# Patient Record
Sex: Female | Born: 1965 | Race: White | Hispanic: No | Marital: Married | Smoking: Former smoker
Health system: Southern US, Community
[De-identification: ages and names within clinical notes are randomized; demographics above are authoritative.]

## PROBLEM LIST (undated history)

## (undated) DIAGNOSIS — C569 Malignant neoplasm of unspecified ovary: Secondary | ICD-10-CM

## (undated) DIAGNOSIS — M810 Age-related osteoporosis without current pathological fracture: Secondary | ICD-10-CM

## (undated) DIAGNOSIS — T7840XA Allergy, unspecified, initial encounter: Secondary | ICD-10-CM

## (undated) DIAGNOSIS — K829 Disease of gallbladder, unspecified: Secondary | ICD-10-CM

## (undated) DIAGNOSIS — Z973 Presence of spectacles and contact lenses: Secondary | ICD-10-CM

## (undated) DIAGNOSIS — E669 Obesity, unspecified: Secondary | ICD-10-CM

## (undated) DIAGNOSIS — E7800 Pure hypercholesterolemia, unspecified: Secondary | ICD-10-CM

## (undated) HISTORY — PX: TONSILLECTOMY: SUR1361

## (undated) HISTORY — DX: Malignant neoplasm of unspecified ovary: C56.9

## (undated) HISTORY — DX: Obesity, unspecified: E66.9

## (undated) HISTORY — DX: Age-related osteoporosis without current pathological fracture: M81.0

## (undated) HISTORY — DX: Allergy, unspecified, initial encounter: T78.40XA

## (undated) HISTORY — DX: Disease of gallbladder, unspecified: K82.9

## (undated) HISTORY — DX: Pure hypercholesterolemia, unspecified: E78.00

## (undated) HISTORY — PX: KNEE RECONSTRUCTION: SHX5883

---

## 2005-02-28 ENCOUNTER — Emergency Department (HOSPITAL_COMMUNITY): Admission: EM | Admit: 2005-02-28 | Discharge: 2005-02-28 | Payer: Self-pay | Admitting: Family Medicine

## 2007-07-22 ENCOUNTER — Ambulatory Visit: Payer: Self-pay | Admitting: Internal Medicine

## 2007-07-22 DIAGNOSIS — N920 Excessive and frequent menstruation with regular cycle: Secondary | ICD-10-CM | POA: Insufficient documentation

## 2007-07-22 DIAGNOSIS — J309 Allergic rhinitis, unspecified: Secondary | ICD-10-CM | POA: Insufficient documentation

## 2007-07-28 LAB — CONVERTED CEMR LAB
ALT: 18 units/L (ref 0–35)
AST: 19 units/L (ref 0–37)
Albumin: 4.3 g/dL (ref 3.5–5.2)
BUN: 10 mg/dL (ref 6–23)
Basophils Relative: 0.3 % (ref 0.0–1.0)
CO2: 30 meq/L (ref 19–32)
Calcium: 9.9 mg/dL (ref 8.4–10.5)
Chloride: 106 meq/L (ref 96–112)
Creatinine, Ser: 0.5 mg/dL (ref 0.4–1.2)
Direct LDL: 143.6 mg/dL
Eosinophils Relative: 1.6 % (ref 0.0–5.0)
Glucose, Bld: 93 mg/dL (ref 70–99)
HDL: 47.8 mg/dL (ref >39.0)
Hemoglobin: 14.3 g/dL (ref 12.0–15.0)
Lymphocytes Relative: 23.8 % (ref 12.0–46.0)
Monocytes Relative: 10.6 % (ref 3.0–12.0)
Neutro Abs: 5.8 10*3/uL (ref 1.4–7.7)
Neutrophils Relative %: 63.7 % (ref 43.0–77.0)
RBC: 4.88 M/uL (ref 3.87–5.11)
Total Bilirubin: 0.6 mg/dL (ref 0.3–1.2)
Total Protein: 7.5 g/dL (ref 6.0–8.3)
VLDL: 17 mg/dL (ref 0–40)
WBC: 9 10*3/uL (ref 4.5–10.5)

## 2007-08-19 ENCOUNTER — Encounter: Payer: Self-pay | Admitting: Internal Medicine

## 2007-08-19 ENCOUNTER — Ambulatory Visit: Payer: Self-pay | Admitting: Internal Medicine

## 2007-08-19 ENCOUNTER — Other Ambulatory Visit: Admission: RE | Admit: 2007-08-19 | Discharge: 2007-08-19 | Payer: Self-pay | Admitting: Internal Medicine

## 2007-08-19 DIAGNOSIS — N852 Hypertrophy of uterus: Secondary | ICD-10-CM | POA: Insufficient documentation

## 2007-08-19 DIAGNOSIS — F17200 Nicotine dependence, unspecified, uncomplicated: Secondary | ICD-10-CM | POA: Insufficient documentation

## 2007-08-19 DIAGNOSIS — E785 Hyperlipidemia, unspecified: Secondary | ICD-10-CM

## 2007-08-19 DIAGNOSIS — J3489 Other specified disorders of nose and nasal sinuses: Secondary | ICD-10-CM | POA: Insufficient documentation

## 2007-08-19 DIAGNOSIS — E7849 Other hyperlipidemia: Secondary | ICD-10-CM | POA: Insufficient documentation

## 2007-09-03 ENCOUNTER — Encounter: Admission: RE | Admit: 2007-09-03 | Discharge: 2007-09-03 | Payer: Self-pay | Admitting: Internal Medicine

## 2007-09-11 ENCOUNTER — Encounter: Admission: RE | Admit: 2007-09-11 | Discharge: 2007-09-11 | Payer: Self-pay | Admitting: Internal Medicine

## 2007-09-24 ENCOUNTER — Encounter: Payer: Self-pay | Admitting: Internal Medicine

## 2008-06-08 HISTORY — PX: CHOLECYSTECTOMY: SHX55

## 2008-06-23 ENCOUNTER — Observation Stay (HOSPITAL_COMMUNITY): Admission: EM | Admit: 2008-06-23 | Discharge: 2008-06-25 | Payer: Self-pay | Admitting: Emergency Medicine

## 2008-06-24 ENCOUNTER — Encounter (INDEPENDENT_AMBULATORY_CARE_PROVIDER_SITE_OTHER): Payer: Self-pay | Admitting: Surgery

## 2009-05-05 ENCOUNTER — Encounter: Admission: RE | Admit: 2009-05-05 | Discharge: 2009-05-05 | Payer: Self-pay | Admitting: Obstetrics and Gynecology

## 2009-05-17 ENCOUNTER — Encounter: Admission: RE | Admit: 2009-05-17 | Discharge: 2009-05-17 | Payer: Self-pay | Admitting: Family Medicine

## 2009-05-25 ENCOUNTER — Encounter: Admission: RE | Admit: 2009-05-25 | Discharge: 2009-05-25 | Payer: Self-pay | Admitting: Family Medicine

## 2009-05-26 ENCOUNTER — Ambulatory Visit: Admission: RE | Admit: 2009-05-26 | Discharge: 2009-05-26 | Payer: Self-pay | Admitting: Gynecologic Oncology

## 2009-06-08 HISTORY — PX: ABDOMINAL HYSTERECTOMY: SHX81

## 2009-06-22 ENCOUNTER — Inpatient Hospital Stay (HOSPITAL_COMMUNITY): Admission: RE | Admit: 2009-06-22 | Discharge: 2009-06-25 | Payer: Self-pay | Admitting: Obstetrics & Gynecology

## 2009-06-22 ENCOUNTER — Encounter: Payer: Self-pay | Admitting: Obstetrics & Gynecology

## 2009-06-29 ENCOUNTER — Ambulatory Visit: Admission: RE | Admit: 2009-06-29 | Discharge: 2009-06-29 | Payer: Self-pay | Admitting: Gynecologic Oncology

## 2009-06-30 ENCOUNTER — Ambulatory Visit: Payer: Self-pay | Admitting: Oncology

## 2009-07-13 LAB — CBC WITH DIFFERENTIAL/PLATELET
Basophils Absolute: 0.1 10*3/uL (ref 0.0–0.1)
Eosinophils Absolute: 0.3 10*3/uL (ref 0.0–0.5)
HCT: 39.6 % (ref 34.8–46.6)
HGB: 13.5 g/dL (ref 11.6–15.9)
LYMPH%: 30 % (ref 14.0–49.7)
MCHC: 34.2 g/dL (ref 31.5–36.0)
MONO#: 0.5 10*3/uL (ref 0.1–0.9)
NEUT#: 3.4 10*3/uL (ref 1.5–6.5)
NEUT%: 55.1 % (ref 38.4–76.8)
Platelets: 374 10*3/uL (ref 145–400)
WBC: 6.1 10*3/uL (ref 3.9–10.3)

## 2009-07-13 LAB — COMPREHENSIVE METABOLIC PANEL
BUN: 10 mg/dL (ref 6–23)
CO2: 26 mEq/L (ref 19–32)
Calcium: 9.1 mg/dL (ref 8.4–10.5)
Creatinine, Ser: 0.47 mg/dL (ref 0.40–1.20)
Glucose, Bld: 83 mg/dL (ref 70–99)
Total Bilirubin: 0.3 mg/dL (ref 0.3–1.2)

## 2009-07-13 LAB — URINALYSIS, MICROSCOPIC - CHCC
Glucose: NEGATIVE g/dL
Ketones: NEGATIVE mg/dL
RBC count: NEGATIVE (ref 0–2)
Specific Gravity, Urine: 1.02 (ref 1.003–1.035)

## 2009-07-13 LAB — CA 125: CA 125: 168.3 U/mL — ABNORMAL HIGH (ref 0.0–30.2)

## 2009-07-14 LAB — URINE CULTURE

## 2009-07-30 ENCOUNTER — Ambulatory Visit: Payer: Self-pay | Admitting: Oncology

## 2009-08-02 LAB — CBC WITH DIFFERENTIAL/PLATELET
BASO%: 0.9 % (ref 0.0–2.0)
EOS%: 1.5 % (ref 0.0–7.0)
LYMPH%: 51.4 % — ABNORMAL HIGH (ref 14.0–49.7)
MCH: 28.7 pg (ref 25.1–34.0)
MCHC: 33.1 g/dL (ref 31.5–36.0)
MONO#: 0.4 10*3/uL (ref 0.1–0.9)
NEUT%: 34.6 % — ABNORMAL LOW (ref 38.4–76.8)
Platelets: 179 10*3/uL (ref 145–400)
RBC: 4.14 10*6/uL (ref 3.70–5.45)
WBC: 3.3 10*3/uL — ABNORMAL LOW (ref 3.9–10.3)
lymph#: 1.7 10*3/uL (ref 0.9–3.3)
nRBC: 0 % (ref 0–0)

## 2009-08-09 LAB — CBC WITH DIFFERENTIAL/PLATELET
Eosinophils Absolute: 0 10*3/uL (ref 0.0–0.5)
HCT: 39.8 % (ref 34.8–46.6)
LYMPH%: 47.2 % (ref 14.0–49.7)
MONO#: 0.7 10*3/uL (ref 0.1–0.9)
NEUT#: 1.5 10*3/uL (ref 1.5–6.5)
Platelets: 267 10*3/uL (ref 145–400)
RBC: 4.51 10*6/uL (ref 3.70–5.45)
WBC: 4.4 10*3/uL (ref 3.9–10.3)

## 2009-08-09 LAB — COMPREHENSIVE METABOLIC PANEL
Albumin: 4.6 g/dL (ref 3.5–5.2)
CO2: 28 mEq/L (ref 19–32)
Calcium: 10 mg/dL (ref 8.4–10.5)
Glucose, Bld: 84 mg/dL (ref 70–99)
Sodium: 141 mEq/L (ref 135–145)
Total Bilirubin: 0.3 mg/dL (ref 0.3–1.2)
Total Protein: 7.5 g/dL (ref 6.0–8.3)

## 2009-08-09 LAB — CA 125: CA 125: 29.1 U/mL (ref 0.0–30.2)

## 2009-08-13 LAB — CBC WITH DIFFERENTIAL/PLATELET
BASO%: 0.1 % (ref 0.0–2.0)
HCT: 41.7 % (ref 34.8–46.6)
MCHC: 33.8 g/dL (ref 31.5–36.0)
MONO#: 0.2 10*3/uL (ref 0.1–0.9)
RBC: 4.77 10*6/uL (ref 3.70–5.45)
RDW: 12.9 % (ref 11.2–14.5)
WBC: 15.7 10*3/uL — ABNORMAL HIGH (ref 3.9–10.3)
lymph#: 2.1 10*3/uL (ref 0.9–3.3)
nRBC: 0 % (ref 0–0)

## 2009-08-18 ENCOUNTER — Ambulatory Visit: Admission: RE | Admit: 2009-08-18 | Discharge: 2009-08-18 | Payer: Self-pay | Admitting: Gynecologic Oncology

## 2009-08-25 LAB — COMPREHENSIVE METABOLIC PANEL
Albumin: 4.3 g/dL (ref 3.5–5.2)
BUN: 13 mg/dL (ref 6–23)
Calcium: 9.5 mg/dL (ref 8.4–10.5)
Chloride: 104 mEq/L (ref 96–112)
Glucose, Bld: 93 mg/dL (ref 70–99)
Potassium: 4.4 mEq/L (ref 3.5–5.3)

## 2009-08-25 LAB — CBC WITH DIFFERENTIAL/PLATELET
Basophils Absolute: 0 10*3/uL (ref 0.0–0.1)
Eosinophils Absolute: 0 10*3/uL (ref 0.0–0.5)
HGB: 12.6 g/dL (ref 11.6–15.9)
NEUT#: 0.7 10*3/uL — ABNORMAL LOW (ref 1.5–6.5)
RBC: 4.27 10*6/uL (ref 3.70–5.45)
RDW: 12.8 % (ref 11.2–14.5)
WBC: 2.7 10*3/uL — ABNORMAL LOW (ref 3.9–10.3)
lymph#: 1.5 10*3/uL (ref 0.9–3.3)

## 2009-08-25 LAB — CA 125: CA 125: 15.9 U/mL (ref 0.0–30.2)

## 2009-08-27 LAB — CBC WITH DIFFERENTIAL/PLATELET
Eosinophils Absolute: 0.1 10*3/uL (ref 0.0–0.5)
MONO#: 1.8 10*3/uL — ABNORMAL HIGH (ref 0.1–0.9)
MONO%: 13.3 % (ref 0.0–14.0)
NEUT#: 8.3 10*3/uL — ABNORMAL HIGH (ref 1.5–6.5)
RBC: 4.35 10*6/uL (ref 3.70–5.45)
RDW: 13.3 % (ref 11.2–14.5)
WBC: 13.4 10*3/uL — ABNORMAL HIGH (ref 3.9–10.3)

## 2009-09-02 ENCOUNTER — Ambulatory Visit: Payer: Self-pay | Admitting: Oncology

## 2009-09-03 LAB — CBC WITH DIFFERENTIAL/PLATELET
Eosinophils Absolute: 0 10*3/uL (ref 0.0–0.5)
HCT: 40.2 % (ref 34.8–46.6)
LYMPH%: 14.9 % (ref 14.0–49.7)
MONO#: 0.1 10*3/uL (ref 0.1–0.9)
NEUT#: 9 10*3/uL — ABNORMAL HIGH (ref 1.5–6.5)
Platelets: 189 10*3/uL (ref 145–400)
RBC: 4.64 10*6/uL (ref 3.70–5.45)
WBC: 10.7 10*3/uL — ABNORMAL HIGH (ref 3.9–10.3)

## 2009-09-14 LAB — CBC WITH DIFFERENTIAL/PLATELET
Basophils Absolute: 0.2 10*3/uL — ABNORMAL HIGH (ref 0.0–0.1)
Eosinophils Absolute: 0 10*3/uL (ref 0.0–0.5)
HCT: 36.3 % (ref 34.8–46.6)
HGB: 12.5 g/dL (ref 11.6–15.9)
MCV: 89.2 fL (ref 79.5–101.0)
MONO%: 2.7 % (ref 0.0–14.0)
NEUT#: 21.7 10*3/uL — ABNORMAL HIGH (ref 1.5–6.5)
RDW: 14.3 % (ref 11.2–14.5)
lymph#: 3.5 10*3/uL — ABNORMAL HIGH (ref 0.9–3.3)

## 2009-09-14 LAB — COMPREHENSIVE METABOLIC PANEL
Albumin: 4.7 g/dL (ref 3.5–5.2)
BUN: 14 mg/dL (ref 6–23)
Calcium: 10 mg/dL (ref 8.4–10.5)
Chloride: 101 mEq/L (ref 96–112)
Glucose, Bld: 86 mg/dL (ref 70–99)
Potassium: 4.3 mEq/L (ref 3.5–5.3)

## 2009-09-24 LAB — CBC WITH DIFFERENTIAL/PLATELET
Eosinophils Absolute: 0 10*3/uL (ref 0.0–0.5)
MONO#: 0 10*3/uL — ABNORMAL LOW (ref 0.1–0.9)
MONO%: 0.3 % (ref 0.0–14.0)
NEUT#: 7.5 10*3/uL — ABNORMAL HIGH (ref 1.5–6.5)
RBC: 4.1 10*6/uL (ref 3.70–5.45)
RDW: 15.9 % — ABNORMAL HIGH (ref 11.2–14.5)
WBC: 8.2 10*3/uL (ref 3.9–10.3)

## 2009-10-06 ENCOUNTER — Ambulatory Visit: Payer: Self-pay | Admitting: Oncology

## 2009-10-08 LAB — CBC WITH DIFFERENTIAL/PLATELET
Basophils Absolute: 0 10*3/uL (ref 0.0–0.1)
EOS%: 0.5 % (ref 0.0–7.0)
Eosinophils Absolute: 0 10*3/uL (ref 0.0–0.5)
HCT: 33.5 % — ABNORMAL LOW (ref 34.8–46.6)
HGB: 11.8 g/dL (ref 11.6–15.9)
MCH: 32.2 pg (ref 25.1–34.0)
MONO#: 0.5 10*3/uL (ref 0.1–0.9)
NEUT#: 1.2 10*3/uL — ABNORMAL LOW (ref 1.5–6.5)
NEUT%: 36.1 % — ABNORMAL LOW (ref 38.4–76.8)
lymph#: 1.7 10*3/uL (ref 0.9–3.3)

## 2009-10-08 LAB — COMPREHENSIVE METABOLIC PANEL
Albumin: 4.6 g/dL (ref 3.5–5.2)
BUN: 10 mg/dL (ref 6–23)
CO2: 28 mEq/L (ref 19–32)
Calcium: 9.9 mg/dL (ref 8.4–10.5)
Chloride: 102 mEq/L (ref 96–112)
Creatinine, Ser: 0.46 mg/dL (ref 0.40–1.20)
Glucose, Bld: 100 mg/dL — ABNORMAL HIGH (ref 70–99)
Potassium: 3.9 mEq/L (ref 3.5–5.3)

## 2009-10-08 LAB — CA 125: CA 125: 13.9 U/mL (ref 0.0–30.2)

## 2009-10-15 LAB — CBC WITH DIFFERENTIAL/PLATELET
Basophils Absolute: 0 10*3/uL (ref 0.0–0.1)
Eosinophils Absolute: 0 10*3/uL (ref 0.0–0.5)
HGB: 12.7 g/dL (ref 11.6–15.9)
MONO%: 0.2 % (ref 0.0–14.0)
NEUT#: 8.1 10*3/uL — ABNORMAL HIGH (ref 1.5–6.5)
RBC: 4.09 10*6/uL (ref 3.70–5.45)
RDW: 18.9 % — ABNORMAL HIGH (ref 11.2–14.5)
WBC: 9 10*3/uL (ref 3.9–10.3)
lymph#: 0.9 10*3/uL (ref 0.9–3.3)

## 2009-10-25 LAB — CBC WITH DIFFERENTIAL/PLATELET
Basophils Absolute: 0 10*3/uL (ref 0.0–0.1)
Eosinophils Absolute: 0 10*3/uL (ref 0.0–0.5)
HGB: 11.3 g/dL — ABNORMAL LOW (ref 11.6–15.9)
LYMPH%: 61.1 % — ABNORMAL HIGH (ref 14.0–49.7)
MCV: 94.2 fL (ref 79.5–101.0)
MONO#: 0.3 10*3/uL (ref 0.1–0.9)
MONO%: 11.9 % (ref 0.0–14.0)
NEUT#: 0.6 10*3/uL — ABNORMAL LOW (ref 1.5–6.5)
Platelets: 265 10*3/uL (ref 145–400)
RBC: 3.44 10*6/uL — ABNORMAL LOW (ref 3.70–5.45)
RDW: 17.4 % — ABNORMAL HIGH (ref 11.2–14.5)
WBC: 2.5 10*3/uL — ABNORMAL LOW (ref 3.9–10.3)

## 2009-10-26 LAB — COMPREHENSIVE METABOLIC PANEL
Albumin: 4.6 g/dL (ref 3.5–5.2)
BUN: 16 mg/dL (ref 6–23)
CO2: 30 mEq/L (ref 19–32)
Calcium: 10 mg/dL (ref 8.4–10.5)
Glucose, Bld: 96 mg/dL (ref 70–99)
Potassium: 3.8 mEq/L (ref 3.5–5.3)
Sodium: 142 mEq/L (ref 135–145)
Total Protein: 7.3 g/dL (ref 6.0–8.3)

## 2009-10-26 LAB — CA 125: CA 125: 12 U/mL (ref 0.0–30.2)

## 2009-11-04 LAB — CBC WITH DIFFERENTIAL/PLATELET
BASO%: 0.8 % (ref 0.0–2.0)
Eosinophils Absolute: 0 10*3/uL (ref 0.0–0.5)
LYMPH%: 31.4 % (ref 14.0–49.7)
MCHC: 33.3 g/dL (ref 31.5–36.0)
MONO#: 0.7 10*3/uL (ref 0.1–0.9)
NEUT#: 5 10*3/uL (ref 1.5–6.5)
RBC: 3.67 10*6/uL — ABNORMAL LOW (ref 3.70–5.45)
RDW: 16.3 % — ABNORMAL HIGH (ref 11.2–14.5)
WBC: 8.5 10*3/uL (ref 3.9–10.3)
lymph#: 2.7 10*3/uL (ref 0.9–3.3)
nRBC: 0 % (ref 0–0)

## 2009-11-05 ENCOUNTER — Ambulatory Visit: Payer: Self-pay | Admitting: Oncology

## 2009-11-05 LAB — CBC WITH DIFFERENTIAL/PLATELET
Basophils Absolute: 0 10*3/uL (ref 0.0–0.1)
EOS%: 0 % (ref 0.0–7.0)
HGB: 12.6 g/dL (ref 11.6–15.9)
MCH: 32.7 pg (ref 25.1–34.0)
MONO#: 0 10*3/uL — ABNORMAL LOW (ref 0.1–0.9)
NEUT#: 10 10*3/uL — ABNORMAL HIGH (ref 1.5–6.5)
RDW: 17.2 % — ABNORMAL HIGH (ref 11.2–14.5)
WBC: 11.1 10*3/uL — ABNORMAL HIGH (ref 3.9–10.3)
lymph#: 1.1 10*3/uL (ref 0.9–3.3)

## 2009-11-16 LAB — CBC WITH DIFFERENTIAL/PLATELET
BASO%: 0.7 % (ref 0.0–2.0)
Basophils Absolute: 0 10*3/uL (ref 0.0–0.1)
MCH: 34 pg (ref 25.1–34.0)
MCV: 97.9 fL (ref 79.5–101.0)
MONO#: 0.6 10*3/uL (ref 0.1–0.9)
NEUT#: 0.7 10*3/uL — ABNORMAL LOW (ref 1.5–6.5)
NEUT%: 21.6 % — ABNORMAL LOW (ref 38.4–76.8)
Platelets: 285 10*3/uL (ref 145–400)
RBC: 3.31 10*6/uL — ABNORMAL LOW (ref 3.70–5.45)
lymph#: 1.8 10*3/uL (ref 0.9–3.3)

## 2009-12-16 ENCOUNTER — Ambulatory Visit (HOSPITAL_COMMUNITY): Admission: RE | Admit: 2009-12-16 | Discharge: 2009-12-16 | Payer: Self-pay | Admitting: Oncology

## 2009-12-17 ENCOUNTER — Ambulatory Visit: Payer: Self-pay | Admitting: Oncology

## 2009-12-22 ENCOUNTER — Ambulatory Visit: Admission: RE | Admit: 2009-12-22 | Discharge: 2009-12-22 | Payer: Self-pay | Admitting: Gynecologic Oncology

## 2010-03-21 ENCOUNTER — Ambulatory Visit: Payer: Self-pay | Admitting: Oncology

## 2010-03-23 LAB — CBC WITH DIFFERENTIAL/PLATELET
BASO%: 0.5 % (ref 0.0–2.0)
Basophils Absolute: 0 10*3/uL (ref 0.0–0.1)
EOS%: 1.4 % (ref 0.0–7.0)
Eosinophils Absolute: 0.1 10*3/uL (ref 0.0–0.5)
HGB: 13 g/dL (ref 11.6–15.9)
LYMPH%: 39.2 % (ref 14.0–49.7)
MCH: 31.4 pg (ref 25.1–34.0)
MCHC: 34.3 g/dL (ref 31.5–36.0)
MCV: 91.4 fL (ref 79.5–101.0)
MONO%: 10.4 % (ref 0.0–14.0)
NEUT#: 2.7 10*3/uL (ref 1.5–6.5)
NEUT%: 48.5 % (ref 38.4–76.8)
Platelets: 227 10*3/uL (ref 145–400)
RBC: 4.15 10*6/uL (ref 3.70–5.45)
RDW: 12.4 % (ref 11.2–14.5)
lymph#: 2.2 10*3/uL (ref 0.9–3.3)

## 2010-03-23 LAB — COMPREHENSIVE METABOLIC PANEL
ALT: 18 U/L (ref 0–35)
Albumin: 4.4 g/dL (ref 3.5–5.2)
Alkaline Phosphatase: 68 U/L (ref 39–117)
Glucose, Bld: 72 mg/dL (ref 70–99)
Potassium: 4 mEq/L (ref 3.5–5.3)
Sodium: 143 mEq/L (ref 135–145)
Total Protein: 7.2 g/dL (ref 6.0–8.3)

## 2010-05-01 ENCOUNTER — Encounter: Payer: Self-pay | Admitting: Obstetrics and Gynecology

## 2010-05-01 ENCOUNTER — Encounter: Payer: Self-pay | Admitting: Internal Medicine

## 2010-05-02 ENCOUNTER — Encounter: Payer: Self-pay | Admitting: Internal Medicine

## 2010-05-05 ENCOUNTER — Other Ambulatory Visit: Payer: Self-pay | Admitting: Oncology

## 2010-05-05 DIAGNOSIS — Z1231 Encounter for screening mammogram for malignant neoplasm of breast: Secondary | ICD-10-CM

## 2010-05-05 DIAGNOSIS — Z1239 Encounter for other screening for malignant neoplasm of breast: Secondary | ICD-10-CM

## 2010-05-19 ENCOUNTER — Ambulatory Visit
Admission: RE | Admit: 2010-05-19 | Discharge: 2010-05-19 | Disposition: A | Discharge status: Home / Self Care | Payer: BC Managed Care – PPO | Source: Ambulatory Visit | Attending: Oncology | Admitting: Oncology

## 2010-05-19 DIAGNOSIS — Z1231 Encounter for screening mammogram for malignant neoplasm of breast: Secondary | ICD-10-CM

## 2010-06-09 ENCOUNTER — Encounter (HOSPITAL_BASED_OUTPATIENT_CLINIC_OR_DEPARTMENT_OTHER): Payer: BC Managed Care – PPO | Admitting: Oncology

## 2010-06-09 ENCOUNTER — Other Ambulatory Visit: Payer: Self-pay | Admitting: Oncology

## 2010-06-09 DIAGNOSIS — I808 Phlebitis and thrombophlebitis of other sites: Secondary | ICD-10-CM

## 2010-06-09 LAB — CA 125: CA 125: 10.2 U/mL (ref 0.0–30.2)

## 2010-06-15 ENCOUNTER — Ambulatory Visit: Payer: BC Managed Care – PPO | Attending: Gynecologic Oncology | Admitting: Gynecologic Oncology

## 2010-06-15 DIAGNOSIS — C569 Malignant neoplasm of unspecified ovary: Secondary | ICD-10-CM | POA: Insufficient documentation

## 2010-06-15 DIAGNOSIS — Z9079 Acquired absence of other genital organ(s): Secondary | ICD-10-CM | POA: Insufficient documentation

## 2010-06-15 DIAGNOSIS — Z90710 Acquired absence of both cervix and uterus: Secondary | ICD-10-CM | POA: Insufficient documentation

## 2010-07-01 NOTE — Consult Note (Signed)
NAMEVANNAH, NADAL NO.:  [id number]  MEDICAL RECORD NO.:  [id number]           PATIENT TYPE:  LOCATION:                                 FACILITY:  PHYSICIAN:  Rosamund Nyland A. Duard Brady, MD    DATE OF BIRTH:  [birth year]  DATE OF CONSULTATION:  06/15/2010 DATE OF DISCHARGE:                                CONSULTATION   Ms. Blecha is a very pleasant 45 year old, as of yesterday, who in February 2011 was diagnosed bilateral complex adnexal masses and an elevated CA-125 of 145.  She underwent exploratory laparotomy, TAH-BSO, and appropriate staging in June 22, 2009.  Findings included a 13-cm ovarian mass.  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  She was treated with 6 cycles of paclitaxel and carboplatin with her last cycle being November 05, 2009.  Post-treatment CT was negative.  I last saw her September 14, at which time her exam was negative.  She was seen by Dr. Darrold Span on December 11, similarly with a negative exam.  Her CA-125 have completely normalized, the one from March 1, was 10 and as stated above, her preoperative CA-125 was elevated to 145.  She comes in today for followup.  She is overall doing very well and denies any significant complaints.  She states she is trying to wean herself off her Effexor.  She is currently on 37.5 mg daily, but each time she tries to stop, the day she does not take it, she really feels very low energy.  She did have some rib discomfort on the right side and when her husband would hug her, it would hurt but that pain is subsequently resolved.  She denies any change in bowel or bladder habits, any nausea, vomiting, fevers, chills, headaches, visual changes, unintentional weight loss or weight gain, or early satiety. 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  PHYSICAL EXAMINATION:  VITAL SIGNS:  Weight 185 pounds,  height 5 feet 2 inches, for a BMI of 34, blood pressure 122/70, pulse 68, respirations 16, and temperature 98. GENERAL:  A well-nourished, well-developed female, in no acute distress. NECK:  Supple.  There is no lymphadenopathy, no thyromegaly. LUNGS:  Clear to auscultation bilaterally. CARDIOVASCULAR:  Regular rate and rhythm. ABDOMEN:  Shows well-healed vertical midline incision.  There is no evidence of any incisional hernias.  Abdomen is soft, nontender, and nondistended.  There are no palpable masses or hepatosplenomegaly. Groins are negative for adenopathy. EXTREMITIES:  No edema. PELVIC:  External genitalia are within normal limits.  Bimanual examination reveals no masses or nodularity.  Rectal confirms.  ASSESSMENT:  A 45-year with stage IC ovarian carcinoma who clinically has no evidence of recurrent disease.  She is just about 1 year from the time of her diagnosis.  Her CA-125 is normal.  PLAN: 1. She will follow up with me in 6 months and return to see Dr.     Darrold Span in 3 months. 2. She is up-to-date on her mammogram.     Javarie Crisp A. Duard Brady, MD  PAG/MEDQ  D:  06/15/2010  T:  06/15/2010  Job:  [id number]  cc:   Lennis P. Darrold Span, M.D. Fax: [phone]  Melida Quitter, M.D. Fax: [phone]  Telford Nab, R.N. [address] [place], [place] [address]  Electronically Signed by Cleda Mccreedy MD on 06/16/2010 12:08:04 PM

## 2010-07-04 LAB — CBC
HCT: 38.2 % (ref 36.0–46.0)
HCT: 39.5 % (ref 36.0–46.0)
HCT: 46.4 % — ABNORMAL HIGH (ref 36.0–46.0)
Hemoglobin: 12.3 g/dL (ref 12.0–15.0)
Hemoglobin: 13.2 g/dL (ref 12.0–15.0)
Hemoglobin: 14.9 g/dL (ref 12.0–15.0)
MCHC: 33.4 g/dL (ref 30.0–36.0)
MCV: 90.4 fL (ref 78.0–100.0)
MCV: 91.1 fL (ref 78.0–100.0)
RBC: 4.17 MIL/uL (ref 3.87–5.11)
RBC: 4.36 MIL/uL (ref 3.87–5.11)
RBC: 5.09 MIL/uL (ref 3.87–5.11)
RDW: 13 % (ref 11.5–15.5)
RDW: 13.2 % (ref 11.5–15.5)
WBC: 10.7 10*3/uL — ABNORMAL HIGH (ref 4.0–10.5)
WBC: 23.9 10*3/uL — ABNORMAL HIGH (ref 4.0–10.5)

## 2010-07-04 LAB — DIFFERENTIAL
Basophils Absolute: 0 10*3/uL (ref 0.0–0.1)
Basophils Absolute: 0.1 10*3/uL (ref 0.0–0.1)
Basophils Relative: 0 % (ref 0–1)
Basophils Relative: 1 % (ref 0–1)
Eosinophils Relative: 0 % (ref 0–5)
Eosinophils Relative: 1 % (ref 0–5)
Lymphocytes Relative: 19 % (ref 12–46)
Monocytes Absolute: 1.8 10*3/uL — ABNORMAL HIGH (ref 0.1–1.0)
Monocytes Relative: 13 % — ABNORMAL HIGH (ref 3–12)
Neutro Abs: 7.7 10*3/uL (ref 1.7–7.7)

## 2010-07-04 LAB — URINALYSIS, ROUTINE W REFLEX MICROSCOPIC
Bilirubin Urine: NEGATIVE
Hgb urine dipstick: NEGATIVE
Ketones, ur: NEGATIVE mg/dL
Nitrite: NEGATIVE
Specific Gravity, Urine: 1.015 (ref 1.005–1.030)
Urobilinogen, UA: 0.2 mg/dL (ref 0.0–1.0)

## 2010-07-04 LAB — COMPREHENSIVE METABOLIC PANEL
AST: 20 U/L (ref 0–37)
Albumin: 4.6 g/dL (ref 3.5–5.2)
Chloride: 102 mEq/L (ref 96–112)
Creatinine, Ser: 0.51 mg/dL (ref 0.4–1.2)
GFR calc Af Amer: >60 mL/min (ref >60)
Potassium: 4.2 mEq/L (ref 3.5–5.1)
Total Bilirubin: 0.6 mg/dL (ref 0.3–1.2)
Total Protein: 8.2 g/dL (ref 6.0–8.3)

## 2010-07-04 LAB — URINE CULTURE: Colony Count: >=100000

## 2010-07-04 LAB — BASIC METABOLIC PANEL
Calcium: 8.3 mg/dL — ABNORMAL LOW (ref 8.4–10.5)
GFR calc Af Amer: >60 mL/min (ref >60)
GFR calc non Af Amer: >60 mL/min (ref >60)
Glucose, Bld: 128 mg/dL — ABNORMAL HIGH (ref 70–99)
Potassium: 4.4 mEq/L (ref 3.5–5.1)
Sodium: 139 mEq/L (ref 135–145)

## 2010-07-04 LAB — URINE MICROSCOPIC-ADD ON

## 2010-07-04 LAB — TYPE AND SCREEN

## 2010-07-21 LAB — CBC
HCT: 41.5 % (ref 36.0–46.0)
Hemoglobin: 14.2 g/dL (ref 12.0–15.0)
MCHC: 34.3 g/dL (ref 30.0–36.0)
Platelets: 308 10*3/uL (ref 150–400)
RDW: 12.9 % (ref 11.5–15.5)

## 2010-07-21 LAB — URINALYSIS, ROUTINE W REFLEX MICROSCOPIC
Glucose, UA: NEGATIVE mg/dL
Hgb urine dipstick: NEGATIVE
Ketones, ur: NEGATIVE mg/dL
Protein, ur: NEGATIVE mg/dL
Urobilinogen, UA: 0.2 mg/dL (ref 0.0–1.0)

## 2010-07-21 LAB — COMPREHENSIVE METABOLIC PANEL
Albumin: 4.2 g/dL (ref 3.5–5.2)
BUN: 10 mg/dL (ref 6–23)
Calcium: 9.7 mg/dL (ref 8.4–10.5)
Creatinine, Ser: 0.46 mg/dL (ref 0.4–1.2)
Glucose, Bld: 109 mg/dL — ABNORMAL HIGH (ref 70–99)
Total Protein: 7.3 g/dL (ref 6.0–8.3)

## 2010-07-21 LAB — DIFFERENTIAL
Basophils Relative: 0 % (ref 0–1)
Lymphocytes Relative: 13 % (ref 12–46)
Lymphs Abs: 1.7 10*3/uL (ref 0.7–4.0)
Monocytes Relative: 7 % (ref 3–12)
Neutro Abs: 10 10*3/uL — ABNORMAL HIGH (ref 1.7–7.7)
Neutrophils Relative %: 80 % — ABNORMAL HIGH (ref 43–77)

## 2010-08-23 NOTE — H&P (Signed)
Carmen Garza, Carmen Garza NO.:  [id number]   MEDICAL RECORD NO.:  [id number]          PATIENT TYPE:  INP   LOCATION:  5504                         FACILITY:  [facility]   PHYSICIAN:  Adolph Pollack, M.D.DATE OF BIRTH:  [birth year]   DATE OF ADMISSION:  06/22/2008  DATE OF DISCHARGE:                              HISTORY & PHYSICAL   CHIEF COMPLAINT:  Severe right upper quadrant pain.   HISTORY:  This is a 45 year old female who had the onset of right upper  quadrant pain about 3 hours after eating an Egg McMuffin.  She said it  started out as an ache, then progressively increased till it was quite  severe and led her to go to Urgent Care, and she was then referred to  [facility] Emergency Department.  She had an ultrasound at [facility]  Emergency Department demonstrating cholelithiasis as well as gallbladder  wall thickening, was concerned with acute cholecystitis.  She has had no  fever but has had associated nausea.   PAST MEDICAL HISTORY:  Chronic sinus disease.   PREVIOUS OPERATIONS:  Tonsillectomy.   ALLERGIES:  None.   MEDICATIONS:  Claritin and multivitamin.   SOCIAL HISTORY:  She smokes about a pack, she is positive for tobacco  use.  No alcohol use.  She is working at a desk job.   FAMILY HISTORY:  Mother has hypertension and colon cancer.  Father died  from lung cancer.   REVIEW OF SYSTEMS:  CARDIOVASCULAR:  She denies heart disease or  hypertension.  PULMONARY:  She denies asthma, pneumonia, or  tuberculosis.  GI:  She denies any peptic ulcer disease, hepatitis, or  jaundice.  GU:  No kidney stones.  ENDOCRINE:  No diabetes or  hypercholesterolemia.  NEUROLOGIC:  No strokes or seizures.  HEMATOLOGIC:  No bleeding disorders or blood transfusions.   PHYSICAL EXAMINATION:  GENERAL:  An overweight female, appears somewhat  uncomfortable.  VITAL SIGNS:  Temperature is 98 degrees, blood pressure is 112/71, pulse  74, respiratory rate 20, and O2  saturations 97% on room air.  EYES:  Extraocular motions intact.  No icterus.  NECK:  Supple without masses.  RESPIRATORY:  Breath sounds equal and clear.  Respirations unlabored.  CARDIOVASCULAR:  Regular rate and regular rhythm.  I hear no murmur.  ABDOMEN:  Soft with right upper quadrant tenderness and guarding and a  positive Murphy sign.  No palpable organomegaly.  No palpable  gallbladder or mass.  EXTREMITIES:  No edema and good muscle tone.  SKIN:  No jaundice.   LABORATORY DATA:  Liver function tests and lipase within normal limits.  White blood cell count is 12,600.   Ultrasound as per HPI.   IMPRESSION:  Evolving acute cholecystitis with cholelithiasis.   PLAN:  Admit and start IV antibiotics.  Hydrate.  Cholecystectomy this  admission.  I have discussed the procedure and risks of laparoscopic or  possible open cholecystectomy.  The risks include but are not limited to  bleeding, infection, wound healing problems, anesthesia, bile leak,  injury to common bile duct/liver/intestine, and postcholecystectomy  diarrhea.  I also talked about light activity for 2 weeks following the  surgery.  She seems to understand this and agrees with the plan.      Adolph Pollack, M.D.  Electronically Signed     TJR/MEDQ  D:  06/23/2008  T:  06/23/2008  Job:  [id number]   cc:   Carmen Mends. Fabian Sharp, MD

## 2010-08-23 NOTE — H&P (Signed)
NAMECAERA, ENWRIGHT NO.:  [id number]   MEDICAL RECORD NO.:  [id number]          PATIENT TYPE:  INP   LOCATION:  5504                         FACILITY:  [facility]   PHYSICIAN:  Adolph Pollack, M.D.DATE OF BIRTH:  [birth year]   DATE OF ADMISSION:  06/22/2008  DATE OF DISCHARGE:                              HISTORY & PHYSICAL   CHIEF COMPLAINT:  Severe right upper quadrant pain.   HISTORY:  This is a 45 year old female who had the onset of right upper  quadrant pain about 3 hours after eating an Egg McMuffin.  She said it  started out as an ache then progressively increased though it was quite  severe and let her to go to Urgent Care and she was then referred to  [facility] Emergency Department.  She had an ultrasound at [facility]  Emergency Department demonstrating cholelithiasis as well as gallbladder  wall thickening, was concerned with acute cholecystitis.  She has had no  fever, but has had associated nausea.   PAST MEDICAL HISTORY:  Chronic sinus disease.   PREVIOUS OPERATIONS:  Tonsillectomy.   ALLERGIES:  None.   MEDICATIONS:  Claritin and multivitamin.   SOCIAL HISTORY:  She smokes about a pack.  She is positive for tobacco  use.  No alcohol use.  She is working at a desk job.   FAMILY HISTORY:  Mother has hypertension and colon cancer.  Father died  from lung cancer.   REVIEW OF SYSTEMS:  CARDIOVASCULAR:  She denies heart disease or  hypertension.  PULMONARY:  She denies asthma, pneumonia, or  tuberculosis.  GI:  She denies any peptic ulcer disease, hepatitis, or  jaundice.  GU:  No kidney stones.  ENDOCRINE:  No diabetes or  hypercholesterolemia.  NEUROLOGIC:  No strokes or seizures.  HEMATOLOGIC:  No bleeding disorders or blood transfusions.   PHYSICAL EXAMINATION:  GENERAL:  An overweight female, appears somewhat  uncomfortable.  VITAL SIGNS:  Temperature is 98 degrees, blood pressure is 112/71, pulse  74, respiratory rate 20, and O2  saturation is 97% on room air.  EYES:  Extraocular motions intact.  No icterus.  NECK:  Supple without masses.  RESPIRATORY:  Breath sounds equal and clear.  Respirations unlabored.  CARDIOVASCULAR:  Regular rate and regular rhythm.  I heard no murmur.  ABDOMEN:  Soft with right upper quadrant tenderness and guarding and a  positive Murphy sign.  No palpable organomegaly.  No palpable  gallbladder or mass.  EXTREMITIES:  No edema and good muscle tone.  SKIN:  No jaundice.   LABORATORY DATA:  Liver function tests; lipase within normal limits.  White blood cell count is 12,600.   Ultrasound as per HPI.   IMPRESSION:  Evolving acute cholecystitis with cholelithiasis.   PLAN:  Admit and start IV antibiotics.  Hydrate.  Cholecystectomy this  admission.  I have discussed the procedure risks, laparoscopic, or  possible open cholecystectomy.  The risks include, but not limited to  bleeding, infection, wound healing problems, anesthesia, bile leak,  injury to common bile duct/liver/intestine, and postcholecystectomy  diarrhea.  We also  talked about light activity for 2 weeks following the  surgery.  She seems to understand this and agrees with the plan.      Adolph Pollack, M.D.  Electronically Signed     TJR/MEDQ  D:  06/23/2008  T:  06/23/2008  Job:  [id number]   cc:   Neta Mends. Fabian Sharp, MD

## 2010-08-23 NOTE — Op Note (Signed)
NAMEJOZEE, HAMMER NO.:  [id number]   MEDICAL RECORD NO.:  [id number]          PATIENT TYPE:  INP   LOCATION:  5504                         FACILITY:  [facility]   PHYSICIAN:  Currie Paris, M.D.DATE OF BIRTH:  Jun 05, 1965   DATE OF PROCEDURE:  06/24/2008  DATE OF DISCHARGE:                               OPERATIVE REPORT   PREOPERATIVE DIAGNOSIS:  Early acute calculus cholecystitis.   POSTOPERATIVE DIAGNOSIS:  Early acute calculus cholecystitis.   OPERATION:  Laparoscopic cholecystectomy with operative cholangiogram.   SURGEON:  Currie Paris, MD   ASSISTANT:  Kelle Darting. Rennis Harding, RN   SURGEON:  Anselm Pancoast. Zachery Dakins, MD   ANESTHESIA:  General.   CLINICAL HISTORY:  This is a 45 year old lady who has typical biliary  symptoms and developed with acute cholecystitis.  After discussion with  the patient, she was admitted to the hospital and scheduled for  laparoscopic cholecystectomy.   DESCRIPTION OF PROCEDURE:  The patient was seen in the holding area and  she had no further questions.  We confirmed cholecystectomy as the  planned procedure.   The patient was taken to the operating room and after satisfactory  general endotracheal anesthesia had been obtained, the abdomen was  prepped and draped.  The time-out was done.   I used 0.25% plain Marcaine for each incision.  The umbilical incision  was made, the fascia opened and the peritoneal cavity entered under  direct vision.  Hasson was introduced and the abdomen insufflated to 15.   The patient was placed in reverse Trendelenburg and tilted to the left.  A 10/11 trocar was placed in the epigastrium using two 5 mm laterally.   Gallbladder was identified and retracted over the liver.  It was  edematous consistent with an early acute cholecystitis.   I opened the peritoneum on either side of the triangle of Calot and I  was able to isolate the cystic duct and cystic artery, had a long  segment of  each identified with nice window behind them.   I put a single clip on the cystic artery and one on the cystic duct at  the junction with the gallbladder.  A Cook catheter was placed  percutaneously and placed into the cystic duct and an operative  cholangiogram was done.  This catheter was only able to get part way  into the duct because of valve, so there was a little extravasation, but  the common duct looked normal with no filling defects.   The cystic duct catheter was removed and 3 clips placed on the stay side  of the cystic duct.  That was divided and 2 additional clips placed in  the cystic artery, which was then divided leaving 2 clips on the stay  side.   The gallbladder was removed from below to above with coagulation current  of the cautery and placed in the bag.  I then irrigated and made sure  everything was dry.  I checked the clips on the duct and they looked  intact as well as the artery.   The gallbladder was brought  out the umbilical site.  The abdomen was  reinsufflated.  A final irrigation check was made and then the lateral  ports were removed under direct vision.  The umbilical site was closed  with a pursestring.  The abdomen was deflated through the epigastric  site.  Skin was closed with 4-0 Monocryl, subcuticular, and Dermabond.   The patient tolerated the procedure well and there were no operative  complications.  All counts were correct.      Currie Paris, M.D.  Electronically Signed     CJS/MEDQ  D:  06/24/2008  T:  06/24/2008  Job:  [id number]

## 2010-08-23 NOTE — Discharge Summary (Signed)
NAMEHAROLDINE, Carmen Garza NO.:  [id number]   MEDICAL RECORD NO.:  [id number]          PATIENT TYPE:  INP   LOCATION:  5504                         FACILITY:  [facility]   PHYSICIAN:  Currie Paris, M.D.DATE OF BIRTH:  18-Jul-1965   DATE OF ADMISSION:  06/22/2008  DATE OF DISCHARGE:  06/25/2008                               DISCHARGE SUMMARY   ADMITTING PHYSICIAN:  Adolph Pollack, MD   DISCHARGE PHYSICIAN:  Currie Paris, MD   PROCEDURES:  Laparoscopic cholecystectomy with intraoperative  cholangiogram by Dr. Jamey Ripa on June 24, 2008.  There were no  consultants.   REASON FOR ADMISSION:  [name] is a 45 year old female who presented  to the emergency department with a complaint of an acute onset of right  upper quadrant abdominal pain 3 hours after eating breakfast.  Her pain  progressively worsened and was then associated with nausea.  At that  time, she presented to the emergency department where an ultrasound was  obtained which showed cholelithiasis with small gallbladder wall  thickening and therefore was admitted with acute cholecystitis.  Please  see admitting history and physical for further details.   ADMITTING DIAGNOSES:  1. Acute cholecystitis.  2. Cholelithiasis.   HOSPITAL COURSE:  At this time, the patient was admitted and allowed to  have clear liquids.  She was started on Unasyn prophylactically.  On  hospital day 2, the patient was taken to the operating room where a  laparoscopic cholecystectomy with intraoperative cholangiogram was  completed.  The patient tolerated this procedure well.  By postoperative  day 1, the patient was doing much better.  She was having some soreness  from surgery, but otherwise tolerating a regular diet without any nausea  or vomiting.  On exam, her abdomen was soft, tender with active bowel  sounds.  Her incisions were all dry and covered.  At this time because  she was tolerating a regular diet, she  was felt stable for discharge  home.   DISCHARGE DIAGNOSES:  1. Acute cholecystitis.  2. Cholelithiasis.  3. Status post laparoscopic cholecystectomy with intraoperative      cholangiogram.   DISCHARGE MEDICATIONS:  The patient is informed that she may resume all  home medications including:  1. Claritin 10 mg daily.  2. MVI 1 tablet daily.  She is also given a prescription for Tylox 1      p.o. q.4 h. p.r.n. pain.   DISCHARGE INSTRUCTIONS:  The patient is informed that she may return to  work in 1-2 weeks.  She is not to do any heavy lifting for 2 weeks  greater than 10 or 15 pounds.  She may shower, however, she is not to  bathe for the next 2 weeks.  She may increase her activity slowly and  may walk up steps.  She has no dietary restrictions, but is informed she  may follow a low-fat diet if higher fat, higher greasy foods tend to  give her loose bowel movements.  She is also informed that she may  remove her gauze and tape tomorrow and she  may shower at that time.  Otherwise, she is to call our office for fever greater than 101.5 or  worsening abdominal pain.  Otherwise, she is scheduled to follow up in  the [facility] on July 07, 2008, at 3 o'clock p.m.  She is otherwise to  call if she cannot make that appointment.      Letha Cape, PA      Currie Paris, M.D.  Electronically Signed    KEO/MEDQ  D:  06/25/2008  T:  06/25/2008  Job:  [id number]

## 2010-10-05 ENCOUNTER — Other Ambulatory Visit: Payer: Self-pay | Admitting: Oncology

## 2010-10-05 ENCOUNTER — Encounter (HOSPITAL_BASED_OUTPATIENT_CLINIC_OR_DEPARTMENT_OTHER): Payer: BC Managed Care – PPO | Admitting: Oncology

## 2010-10-05 DIAGNOSIS — I808 Phlebitis and thrombophlebitis of other sites: Secondary | ICD-10-CM

## 2010-10-05 DIAGNOSIS — C569 Malignant neoplasm of unspecified ovary: Secondary | ICD-10-CM

## 2010-10-05 LAB — COMPREHENSIVE METABOLIC PANEL
Albumin: 3.8 g/dL (ref 3.5–5.2)
BUN: 13 mg/dL (ref 6–23)
CO2: 29 mEq/L (ref 19–32)
Glucose, Bld: 82 mg/dL (ref 70–99)
Potassium: 3.9 mEq/L (ref 3.5–5.3)
Sodium: 136 mEq/L (ref 135–145)
Total Bilirubin: 0.2 mg/dL — ABNORMAL LOW (ref 0.3–1.2)
Total Protein: 6.8 g/dL (ref 6.0–8.3)

## 2010-10-05 LAB — CBC WITH DIFFERENTIAL/PLATELET
Basophils Absolute: 0 10*3/uL (ref 0.0–0.1)
Eosinophils Absolute: 0.1 10*3/uL (ref 0.0–0.5)
HGB: 12.9 g/dL (ref 11.6–15.9)
LYMPH%: 40.3 % (ref 14.0–49.7)
MONO#: 0.6 10*3/uL (ref 0.1–0.9)
NEUT#: 3 10*3/uL (ref 1.5–6.5)
Platelets: 189 10*3/uL (ref 145–400)
RBC: 4.25 10*6/uL (ref 3.70–5.45)
WBC: 6.2 10*3/uL (ref 3.9–10.3)

## 2010-10-05 LAB — CA 125: CA 125: 11.9 U/mL (ref 0.0–30.2)

## 2010-12-14 ENCOUNTER — Encounter (HOSPITAL_BASED_OUTPATIENT_CLINIC_OR_DEPARTMENT_OTHER): Payer: BC Managed Care – PPO | Admitting: Oncology

## 2010-12-14 ENCOUNTER — Other Ambulatory Visit: Payer: Self-pay | Admitting: Oncology

## 2010-12-14 DIAGNOSIS — C569 Malignant neoplasm of unspecified ovary: Secondary | ICD-10-CM

## 2010-12-15 LAB — CA 125: CA 125: 9.9 U/mL (ref 0.0–30.2)

## 2010-12-28 ENCOUNTER — Ambulatory Visit: Payer: BC Managed Care – PPO | Attending: Gynecologic Oncology | Admitting: Gynecologic Oncology

## 2010-12-28 DIAGNOSIS — R51 Headache: Secondary | ICD-10-CM | POA: Insufficient documentation

## 2010-12-28 DIAGNOSIS — Z9221 Personal history of antineoplastic chemotherapy: Secondary | ICD-10-CM | POA: Insufficient documentation

## 2010-12-28 DIAGNOSIS — Z9079 Acquired absence of other genital organ(s): Secondary | ICD-10-CM | POA: Insufficient documentation

## 2010-12-28 DIAGNOSIS — C569 Malignant neoplasm of unspecified ovary: Secondary | ICD-10-CM | POA: Insufficient documentation

## 2010-12-28 DIAGNOSIS — Z90710 Acquired absence of both cervix and uterus: Secondary | ICD-10-CM | POA: Insufficient documentation

## 2010-12-30 NOTE — Consult Note (Signed)
NAMELAVELLE, BERLAND NO.:  [id number]  MEDICAL RECORD NO.:  [id number]  LOCATION:  GYN                          FACILITY:  [facility]  PHYSICIAN:  Mutasim Tuckey A. Duard Brady, MD    DATE OF BIRTH:  1965-05-18  DATE OF CONSULTATION:  12/28/2010 DATE OF DISCHARGE:                                CONSULTATION   Carmen Garza is a very pleasant 45 year old who in February 2011 was diagnosed with bilateral complex adnexal masses and elevated CA-125 of 145.  She underwent exploratory laparotomy, TAHBSO, and appropriate staging in March 2011.  Operative findings included a 13-cm ovarian mass.  Pathology was consistent with a grade 1 endometrioid adenocarcinoma arising within an atypical proliferation of an endometrioid tumor and endometriosis, final stage I C.  She completed treatment with six cycles of paclitaxel and carboplatin with her last cycle of chemotherapy in May-July 2011.  Posttreatment CT scan was negative.  Her CA-125 is completely normalized.  I last saw her in March 2012, at which time, she is doing quite well.  She continued feeling well and wanted to wean herself off her Effexor.  Unfortunately, over time as she has done that, she has noticed that she is having increasing headaches which are new for her.  She is also having some anger issues with her husband and related to work.  She does feel depressed and this has really been going on for a few months, and in hindsight, when we talked about it, she does state that there is an association between when she really started coming off her Effexor when these symptoms arose.  Her 10-point review of systems is otherwise negative with the exception of some increasing hot flashes.  She denies any chest pain, shortness of breath, nausea, vomiting, fevers, chills, abdominal pain, change in bowel or bladder habits.  Headaches are there.  She does not have one today.  There is no neurologic symptoms associated with that. She had a  CA-125 done on December 14, 2010, that was normal at 10.  PHYSICAL EXAMINATION:  VITAL SIGNS:  Weight 187 pounds, blood pressure 102/70, respirations 16, and temperature 97.9. GENERAL:  Well-nourished and well-developed female, in no acute distress. NECK:  Supple.  There is no lymphadenopathy, no thyromegaly. LUNGS:  Clear to auscultation bilaterally. CARDIOVASCULAR:  Regular rate and rhythm. ABDOMEN:  Shows well-healed vertical midline incision.  Abdomen is soft, nontender, and nondistended.  There are no palpable masses or hepatosplenomegaly.  There is no incisional hernia.  Groins are negative for adenopathy. EXTREMITIES:  There is no edema. PELVIC:  External genitalia is within normal limits.  Bimanual examination reveals no masses or nodularity.  Rectal confirms.  ASSESSMENT:  A 46-year with stage I C grade I ovarian carcinoma, endometrioid type diagnosed in March 2011.  She completed her last cycle of paclitaxel and carboplatin in July 2011, had a negative post- treatment CT scan.  She clinically has no evidence of recurrent disease with a negative exam and negative cancer antigen-125.  PLAN:  She will follow up with Dr. Darrold Span in 3 months for repeat evaluation of CA-125.  She is concerned about her medical bills and I  discussed with  her that we most likely do not need to give full labs  at each of her visits, but she can discuss this with Dr. Darrold Span.   She should return to see [name] in 6 months. I prescribed Effexor 37.5.  She will call us in 2 weeks and let us know how she is feeling with increased dose.     Tomma Ehinger A. Duard Brady, MD     PAG/MEDQ  D:  12/28/2010  T:  12/28/2010  Job:  [id number]  cc:   Telford Nab, R.N. [address] [place], [place] [address]  Reece Packer, M.D. Fax: [phone]  Melida Quitter, M.D. Fax: [phone]  Electronically Signed by Cleda Mccreedy MD on 12/30/2010 07:44:16 AM

## 2011-03-29 ENCOUNTER — Other Ambulatory Visit: Payer: Self-pay | Admitting: Oncology

## 2011-03-29 ENCOUNTER — Encounter: Payer: Self-pay | Admitting: Physician Assistant

## 2011-03-29 ENCOUNTER — Other Ambulatory Visit (HOSPITAL_BASED_OUTPATIENT_CLINIC_OR_DEPARTMENT_OTHER): Payer: BC Managed Care – PPO | Admitting: Lab

## 2011-03-29 ENCOUNTER — Ambulatory Visit (HOSPITAL_BASED_OUTPATIENT_CLINIC_OR_DEPARTMENT_OTHER): Payer: BC Managed Care – PPO | Admitting: Physician Assistant

## 2011-03-29 ENCOUNTER — Telehealth: Payer: Self-pay | Admitting: Oncology

## 2011-03-29 VITALS — BP 155/75 | HR 85 | Temp 97.1°F | Ht 62.5 in | Wt 192.3 lb

## 2011-03-29 DIAGNOSIS — C569 Malignant neoplasm of unspecified ovary: Secondary | ICD-10-CM

## 2011-03-29 DIAGNOSIS — Z8371 Family history of colonic polyps: Secondary | ICD-10-CM

## 2011-03-29 DIAGNOSIS — J302 Other seasonal allergic rhinitis: Secondary | ICD-10-CM

## 2011-03-29 HISTORY — DX: Malignant neoplasm of unspecified ovary: C56.9

## 2011-03-29 LAB — COMPREHENSIVE METABOLIC PANEL
ALT: 28 U/L (ref 0–35)
Albumin: 4.3 g/dL (ref 3.5–5.2)
CO2: 28 mEq/L (ref 19–32)
Chloride: 104 mEq/L (ref 96–112)
Glucose, Bld: 100 mg/dL — ABNORMAL HIGH (ref 70–99)
Potassium: 4.4 mEq/L (ref 3.5–5.3)
Sodium: 142 mEq/L (ref 135–145)
Total Protein: 7 g/dL (ref 6.0–8.3)

## 2011-03-29 LAB — CBC WITH DIFFERENTIAL/PLATELET
BASO%: 0.5 % (ref 0.0–2.0)
EOS%: 1.3 % (ref 0.0–7.0)
MCH: 30.4 pg (ref 25.1–34.0)
MCHC: 34.5 g/dL (ref 31.5–36.0)
RDW: 12.6 % (ref 11.2–14.5)
lymph#: 2.2 10*3/uL (ref 0.9–3.3)

## 2011-03-29 LAB — CA 125: CA 125: 11.8 U/mL (ref 0.0–30.2)

## 2011-03-29 NOTE — Telephone Encounter (Signed)
Pt will be seen by Dr Duard Brady in March 2013 per Dagmar Hait, pt aware of appt.

## 2011-03-30 NOTE — Progress Notes (Signed)
No images are attached to the encounter. No scans are attached to the encounter. No scans are attached to the encounter. 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  CURRENT THERAPY: Observation  INTERVAL HISTORY: ALAYNE ESTRELLA 45 y.o. female returns for a scheduled regular 6 month office visit for followup of her history 1C ovarian carcinoma. The patient voices no specific complaints. She does state that she is doing much better on the extended release Effexor as prescribed by Dr. Duard Brady. She reports occasional hot flashes. She has not inject had a colonoscopy. She plans to followup with her primary care physician Dr. Tiburcio Pea and have him refer her to an [facility] gastroenterologist for this procedure. She has gained weight but plans to modify her diet, cutting out junk food, and begin exercising 4 days a week right after Christmas.  MEDICAL HISTORY: Past Medical History  Diagnosis Date  . Ovarian cancer 03/29/2011    ALLERGIES:   has no known allergies.  MEDICATIONS:  Current Outpatient Prescriptions  Medication Sig Dispense Refill  . loratadine (CLARITIN) 10 MG tablet Take 10 mg by mouth daily.        . pseudoephedrine (SUDAFED) 30 MG tablet Take 30 mg by mouth every 4 (four) hours as needed.        . venlafaxine (EFFEXOR) 37.5 MG tablet Take 37.5 mg by mouth 2 (two) times daily.          SURGICAL HISTORY: History reviewed. No pertinent past surgical history.  REVIEW OF SYSTEMS:  A comprehensive review of systems was negative except for: Occasional hot flashes.   PHYSICAL EXAMINATION: General appearance: alert, cooperative, appears stated age and no distress Head: Normocephalic,  without obvious abnormality, atraumatic Neck: no adenopathy, no carotid bruit, no JVD, supple, symmetrical, trachea midline and thyroid not enlarged, symmetric, no tenderness/mass/nodules Lymph nodes: Cervical, supraclavicular, and axillary nodes normal. Resp: clear to auscultation bilaterally Back: symmetric, no curvature. ROM normal. No CVA tenderness. Cardio: regular rate and rhythm, S1, S2 normal, no murmur, click, rub or gallop GI: abnormal findings:  obese and Nontender no hepatosplenomegaly. Extremities: extremities normal, atraumatic, no cyanosis or edema  ECOG PERFORMANCE STATUS: 0 - Asymptomatic  Blood pressure 155/75, pulse 85, temperature 97.1 F (36.2 C), temperature source Oral, height 5' 2.5" (1.588 m), weight 192 lb 4.8 oz (87.227 kg).  LABORATORY DATA: Lab Results  Component Value Date   WBC 6.3 03/29/2011   HGB 13.7 03/29/2011   HCT 39.8 03/29/2011   MCV 88.0 03/29/2011   PLT 206 03/29/2011      Chemistry      Component Value Date/Time   NA 142 03/29/2011 0851   NA 142 03/29/2011 0851   K 4.4 03/29/2011 0851   K 4.4 03/29/2011 0851   CL 104 03/29/2011 0851   CL 104 03/29/2011 0851   CO2 28 03/29/2011 0851   CO2 28 03/29/2011 0851   BUN 12 03/29/2011 0851   BUN 12 03/29/2011 0851   CREATININE 0.48* 03/29/2011 0851   CREATININE 0.48* 03/29/2011 0851      Component Value Date/Time   CALCIUM 9.6 03/29/2011 0851   CALCIUM 9.6 03/29/2011 0851   ALKPHOS 76 03/29/2011 0851   ALKPHOS 76 03/29/2011 0851   AST 24  03/29/2011 0851   AST 24 03/29/2011 0851   ALT 28 03/29/2011 0851   ALT 28 03/29/2011 0851   BILITOT 0.5 03/29/2011 0851   BILITOT 0.5 03/29/2011 0851       RADIOGRAPHIC STUDIES:  No results found.   ASSESSMENT/PLAN: This is a very pleasant 45 year old white female with a history of 1C ovarian carcinoma treated surgically a diagnosis in February 2011 followed by 6 cycles of adjuvant chemotherapy with carboplatin and Taxol from July 2001. No  known recurrence. She is been seen by Dr. Duard Brady in September and her CA -125 at that point was 9.9 down from 11.9 in June of 2012. CA 125 from today's visit is 11.8. Patient was discussed with Dr. Darrold Span. The patient is encouraged to keep her appointment with Dr. Tiburcio Pea, her primary care physician, and proceed with a colonoscopy as has been recommended given her family history of colon cancer. Of note her intermittent diarrhea has resolved. She'll follow up with Dr. Darrold Span in approximately 3 months with repeat CBC differential C met and CA 125. She was encouraged in her efforts towards weight loss.     Conni Slipper, PA-C     All questions were answered. The patient knows to call the clinic with any problems, questions or concerns. We can certainly see the patient much sooner if necessary.

## 2011-04-02 ENCOUNTER — Encounter (HOSPITAL_COMMUNITY): Payer: Self-pay

## 2011-04-02 ENCOUNTER — Emergency Department (INDEPENDENT_AMBULATORY_CARE_PROVIDER_SITE_OTHER)
Admission: EM | Admit: 2011-04-02 | Discharge: 2011-04-02 | Disposition: A | Discharge status: Home / Self Care | Payer: BC Managed Care – PPO | Source: Home / Self Care | Attending: Family Medicine | Admitting: Family Medicine

## 2011-04-02 DIAGNOSIS — J329 Chronic sinusitis, unspecified: Secondary | ICD-10-CM

## 2011-04-02 MED ORDER — AMOXICILLIN-POT CLAVULANATE 875-125 MG PO TABS: 1.0000 | ORAL_TABLET | Freq: Two times a day (BID) | ORAL | Status: AC

## 2011-04-02 NOTE — ED Provider Notes (Signed)
History     CSN: [id number]  Arrival date & time 04/02/11  4098   First MD Initiated Contact with Patient 04/02/11 [id number]      Chief Complaint  Patient presents with  . Facial Pain    (Consider location/radiation/quality/duration/timing/severity/associated sxs/prior treatment) HPI Comments: Carmen Garza presents for evaluation of sinus pressure, pain behind her LEFT eye, headache. She reports hx of seasonal allergies, for which she takes Claritin. She reports that she was supposed to take nasal steroids as well but has difficulty tolerating them. She reports a recurrent hx of sinus infections. She denies tobacco.   Patient is a 45 y.o. female presenting with sinusitis. The history is provided by the patient.  Sinusitis  This is a new problem. The current episode started yesterday. The problem has not changed since onset.There has been no fever. The pain is mild. The pain has been constant since onset. Associated symptoms include congestion and sinus pressure. Pertinent negatives include no chills, no ear pain, no sore throat, no cough and no shortness of breath. She has tried nothing for the symptoms.    Past Medical History  Diagnosis Date  . Ovarian cancer 03/29/2011    History reviewed. No pertinent past surgical history.  History reviewed. No pertinent family history.  History  Substance Use Topics  . Smoking status: Former Smoker -- 0.5 packs/day for 12 years  . Smokeless tobacco: Not on file  . Alcohol Use: Yes    OB History    Grav Para Term Preterm Abortions TAB SAB Ect Mult Living                  Review of Systems  Constitutional: Negative for fever and chills.  HENT: Positive for congestion, facial swelling and sinus pressure. Negative for ear pain, sore throat and trouble swallowing.   Eyes: Negative.   Respiratory: Negative for cough, shortness of breath and wheezing.   Cardiovascular: Negative.   Gastrointestinal: Negative.   Genitourinary: Negative.     Musculoskeletal: Negative.   Skin: Negative.     Allergies  Review of patient's allergies indicates no known allergies.  Home Medications   Current Outpatient Rx  Name Route Sig Dispense Refill  . AMOXICILLIN-POT CLAVULANATE 875-125 MG PO TABS Oral Take 1 tablet by mouth 2 (two) times daily. 20 tablet 0  . LORATADINE 10 MG PO TABS Oral Take 10 mg by mouth daily.      Marland Kitchen PSEUDOEPHEDRINE HCL 30 MG PO TABS Oral Take 30 mg by mouth every 4 (four) hours as needed.      . VENLAFAXINE HCL 37.5 MG PO TABS Oral Take 37.5 mg by mouth 2 (two) times daily.        BP 142/89  Pulse 89  Temp(Src) 98.3 F (36.8 C) (Oral)  Resp 14  SpO2 100%  Physical Exam  Nursing note and vitals reviewed. Constitutional: She is oriented to person, place, and time. She appears well-developed and well-nourished.  HENT:  Head: Normocephalic and atraumatic.  Right Ear: Tympanic membrane is retracted.  Left Ear: Tympanic membrane is retracted.  Mouth/Throat: Uvula is midline, oropharynx is clear and moist and mucous membranes are normal.  Eyes: EOM are normal.  Neck: Normal range of motion.  Pulmonary/Chest: Effort normal and breath sounds normal. She has no wheezes. She has no rhonchi.  Musculoskeletal: Normal range of motion.  Neurological: She is alert and oriented to person, place, and time.  Skin: Skin is warm and dry.  Psychiatric: Her behavior is normal.  ED Course  Procedures (including critical care time)  Labs Reviewed - No data to display No results found.   1. Sinusitis       MDM  rx for Augmentin given        Richardo Priest, MD 04/02/11 1025

## 2011-04-02 NOTE — ED Notes (Signed)
Pt has sinus pressure and swelling of face on lt side since yesterday

## 2011-06-20 ENCOUNTER — Other Ambulatory Visit: Payer: Self-pay | Admitting: Family Medicine

## 2011-06-27 ENCOUNTER — Ambulatory Visit: Payer: BC Managed Care – PPO | Admitting: Oncology

## 2011-07-05 ENCOUNTER — Ambulatory Visit (HOSPITAL_BASED_OUTPATIENT_CLINIC_OR_DEPARTMENT_OTHER): Payer: BC Managed Care – PPO | Admitting: Oncology

## 2011-07-05 ENCOUNTER — Telehealth: Payer: Self-pay | Admitting: Oncology

## 2011-07-05 ENCOUNTER — Encounter: Payer: Self-pay | Admitting: Oncology

## 2011-07-05 ENCOUNTER — Other Ambulatory Visit (HOSPITAL_BASED_OUTPATIENT_CLINIC_OR_DEPARTMENT_OTHER): Payer: BC Managed Care – PPO | Admitting: Lab

## 2011-07-05 VITALS — BP 129/85 | HR 84 | Temp 97.3°F | Ht 62.5 in | Wt 191.1 lb

## 2011-07-05 DIAGNOSIS — E669 Obesity, unspecified: Secondary | ICD-10-CM

## 2011-07-05 DIAGNOSIS — Z1231 Encounter for screening mammogram for malignant neoplasm of breast: Secondary | ICD-10-CM

## 2011-07-05 DIAGNOSIS — C569 Malignant neoplasm of unspecified ovary: Secondary | ICD-10-CM

## 2011-07-05 DIAGNOSIS — Z85038 Personal history of other malignant neoplasm of large intestine: Secondary | ICD-10-CM

## 2011-07-05 DIAGNOSIS — H6690 Otitis media, unspecified, unspecified ear: Secondary | ICD-10-CM

## 2011-07-05 LAB — CBC WITH DIFFERENTIAL/PLATELET
Eosinophils Absolute: 0.1 10*3/uL (ref 0.0–0.5)
HCT: 40.7 % (ref 34.8–46.6)
LYMPH%: 39.7 % (ref 14.0–49.7)
MCHC: 33.8 g/dL (ref 31.5–36.0)
MCV: 88.4 fL (ref 79.5–101.0)
MONO#: 0.7 10*3/uL (ref 0.1–0.9)
MONO%: 9.6 % (ref 0.0–14.0)
NEUT#: 3.4 10*3/uL (ref 1.5–6.5)
NEUT%: 49.3 % (ref 38.4–76.8)
Platelets: 238 10*3/uL (ref 145–400)
RBC: 4.61 10*6/uL (ref 3.70–5.45)
WBC: 6.8 10*3/uL (ref 3.9–10.3)

## 2011-07-05 LAB — COMPREHENSIVE METABOLIC PANEL
ALT: 19 U/L (ref 0–35)
Alkaline Phosphatase: 83 U/L (ref 39–117)
Creatinine, Ser: 0.48 mg/dL — ABNORMAL LOW (ref 0.50–1.10)
Sodium: 139 mEq/L (ref 135–145)
Total Bilirubin: 0.3 mg/dL (ref 0.3–1.2)
Total Protein: 7.3 g/dL (ref 6.0–8.3)

## 2011-07-05 LAB — CA 125: CA 125: 11.5 U/mL (ref 0.0–30.2)

## 2011-07-05 NOTE — Progress Notes (Signed)
OFFICE PROGRESS NOTE Date of Visit 07-05-2011 Physicians: Holley Bouche, P.Gehrig  INTERVAL HISTORY:  Patient is seen, alone for visit, in follow up of history of IC ovarian carcinoma. She is also scheduled to Dr.Gehrig 07-27-11, which may be a bit sooner than she needs with appointment here today. 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 Surgery was TAH/BSO, bilateral pelvic and periaortic lymphadenectomy and omentectomy by Dr.Gehrig 06-22-2009, followed by 6 cycles of taxol/carboplatin thru July 2011. Last scans were CT AP 12/2009. She saw Dr.Gehrig last in Sept 2012. Ca 125 preop was 145, in Dec 2012  11.8 and in Sept 2012  9.9.   Patient is due mammograms which we will set up, last at [facility] 05-20-2010. She had had some soreness lateral left breast which resolved with decrease in caffeine.  She has not yet been seen by GI, tho I had tried to make that referral when I saw her last, particularly as patient's mother had colon cancer at age 17. She is still reluctant to go to GI and does not want me to make referral now, but tells me she expects her primary Dr.Harris to refer her to [facility] when she sees him for physical exam on April 8. Note present NCCN recommendation for patients with first degree relative with colorectal cancer at age 49-60 is for colonoscopy beginning age 62 and every 5 years.   Patient tells me that she had been doing very well other than environmental allergies and sinusitis ~ a month ago. She has some post nasal drainage and some left ear discomfort for past couple of days, despite sudafed. She has no fever and no lower respiratory symptoms.  She tried coming off Effexor before Dr.Gehrig's last visit and found that hot flashes were not as severe but she felt very irritable. She is much less  irritable on the Effexor, but feels "too unemotional" and wonders if dose of the XR can be decreased:  See medications below.  Review of Systems otherwise: gradual, progressive weight gain, not really addressing with diet/exercise which we have discussed. No breast symptoms now. No pain except left ear. Bowels moving normally, no noted bleeding, no bladder symptoms, no LE swelling, no skin problems, no HA or other neurologic symptoms. Remainder of 10 point ROS negative.  Objective:  Vital signs in last 24 hours:  BP 129/85  Pulse 84  Temp(Src) 97.3 F (36.3 C) (Oral)  Ht 5' 2.5" (1.588 m)  Wt 191 lb 1.6 oz (86.682 kg)  BMI 34.40 kg/m2 Weight is up 8 lbs from June 2012. Alert, very pleasant as always, easily mobile, looks comfortable.  HEENT:mucous membranes moist, pharynx normal without lesions. PERRL. Left TM dull with some erythema central and superiorly, right TM with good light reflex and no erythema. LymphaticsCervical, supraclavicular, and axillary nodes normal.No inguinal adenopathy. Resp: clear to auscultation bilaterally and normal percussion bilaterally Cardio: regular rate and rhythm GI: soft, non-tender; bowel sounds normal; no masses,  no organomegaly palpable, obese. Surgical incision well-healed. Extremities: extremities normal, atraumatic, no cyanosis or edema Neuro:CN, motor, sensory nonfocal Breasts bilaterally without dominant masses, skin or nipple changes, axillae benign. Lab Results:  [facility] 07/05/11 1034  WBC 6.8  HGB 13.8  HCT 40.7  PLT 238  ANC 3.4  BMET  Basename 07/05/11 1034  NA 139  K 3.9  CL 104  CO2 30  GLUCOSE 97  BUN 12  CREATININE 0.48*  CALCIUM 9.7  Remainder of full CMET normal CA 125 resulted after visit  11.5 Studies/Results: Mammograms ordered now Medications: I have reviewed the patient's current medications. She is on lowest dose of Effexor XR available.  I have written amoxicillin 250mg  tid x 7 days for the  otitis. Assessment/Plan:  1. IC ovarian carcinoma, history and treatment as above, clinically doing well. I will let gyn oncology know of today's visit. I will see her back probably in 6 months after their next visit, or sooner if needed. 2.left otitis media: amoxicillin as above 3. History of colon cancer in mother at age 27: screening colonoscopy appropriate per NCCN guidelines. 4.obesity: she is not eligible for GOG-0225 as < stage II and too far out from treatment, however I have discussed this diet/physical activity trial with her in general. The exercise part of this trial is attempting to increase steps/day by 4000 above baseline. 5.due mammograms, set up now. 6.financial concerns related to medical care   Reece Packer, MD   07/05/2011, 9:33 PM

## 2011-07-05 NOTE — Telephone Encounter (Signed)
Gv pt appt for dec2013.  pt stated that she will schedule mammogram appt. called kim wih Dr. Duard Brady and she wil call pt with june2013 appt

## 2011-07-05 NOTE — Patient Instructions (Signed)
I have left message for Harriett Sine in gyn onc to see if Dr.Gehrig's apt can be moved. Nancy's # is [phone]

## 2011-07-06 ENCOUNTER — Other Ambulatory Visit: Payer: Self-pay

## 2011-07-06 DIAGNOSIS — H6690 Otitis media, unspecified, unspecified ear: Secondary | ICD-10-CM

## 2011-07-06 MED ORDER — AMOXICILLIN 250 MG PO CAPS: 250.0000 mg | ORAL_CAPSULE | Freq: Three times a day (TID) | ORAL | Status: AC

## 2011-07-24 ENCOUNTER — Ambulatory Visit
Admission: RE | Admit: 2011-07-24 | Discharge: 2011-07-24 | Disposition: A | Discharge status: Home / Self Care | Payer: BC Managed Care – PPO | Source: Ambulatory Visit | Attending: Oncology | Admitting: Oncology

## 2011-07-24 ENCOUNTER — Other Ambulatory Visit: Payer: Self-pay | Admitting: Oncology

## 2011-07-24 ENCOUNTER — Ambulatory Visit: Payer: BC Managed Care – PPO

## 2011-07-24 DIAGNOSIS — Z1231 Encounter for screening mammogram for malignant neoplasm of breast: Secondary | ICD-10-CM

## 2011-07-27 ENCOUNTER — Ambulatory Visit: Payer: BC Managed Care – PPO | Admitting: Gynecologic Oncology

## 2011-09-27 ENCOUNTER — Encounter: Payer: Self-pay | Admitting: Gynecologic Oncology

## 2011-09-28 ENCOUNTER — Ambulatory Visit: Payer: BC Managed Care – PPO | Admitting: Lab

## 2011-09-28 ENCOUNTER — Ambulatory Visit: Payer: BC Managed Care – PPO | Attending: Gynecologic Oncology | Admitting: Gynecologic Oncology

## 2011-09-28 ENCOUNTER — Other Ambulatory Visit: Payer: BC Managed Care – PPO | Admitting: Lab

## 2011-09-28 ENCOUNTER — Ambulatory Visit: Payer: BC Managed Care – PPO

## 2011-09-28 ENCOUNTER — Encounter: Payer: Self-pay | Admitting: Gynecologic Oncology

## 2011-09-28 VITALS — BP 108/68 | HR 80 | Temp 97.8°F | Resp 16 | Ht 62.0 in | Wt 173.0 lb

## 2011-09-28 DIAGNOSIS — Z833 Family history of diabetes mellitus: Secondary | ICD-10-CM | POA: Insufficient documentation

## 2011-09-28 DIAGNOSIS — Z8543 Personal history of malignant neoplasm of ovary: Secondary | ICD-10-CM | POA: Insufficient documentation

## 2011-09-28 DIAGNOSIS — Z9221 Personal history of antineoplastic chemotherapy: Secondary | ICD-10-CM | POA: Insufficient documentation

## 2011-09-28 DIAGNOSIS — Z800 Family history of malignant neoplasm of digestive organs: Secondary | ICD-10-CM | POA: Insufficient documentation

## 2011-09-28 DIAGNOSIS — Z90710 Acquired absence of both cervix and uterus: Secondary | ICD-10-CM | POA: Insufficient documentation

## 2011-09-28 DIAGNOSIS — E669 Obesity, unspecified: Secondary | ICD-10-CM | POA: Insufficient documentation

## 2011-09-28 DIAGNOSIS — Z8249 Family history of ischemic heart disease and other diseases of the circulatory system: Secondary | ICD-10-CM | POA: Insufficient documentation

## 2011-09-28 DIAGNOSIS — Z09 Encounter for follow-up examination after completed treatment for conditions other than malignant neoplasm: Secondary | ICD-10-CM | POA: Insufficient documentation

## 2011-09-28 DIAGNOSIS — Z87891 Personal history of nicotine dependence: Secondary | ICD-10-CM | POA: Insufficient documentation

## 2011-09-28 DIAGNOSIS — Z801 Family history of malignant neoplasm of trachea, bronchus and lung: Secondary | ICD-10-CM | POA: Insufficient documentation

## 2011-09-28 DIAGNOSIS — C569 Malignant neoplasm of unspecified ovary: Secondary | ICD-10-CM

## 2011-09-28 LAB — CA 125: CA 125: 11.9 U/mL (ref 0.0–30.2)

## 2011-09-28 NOTE — Patient Instructions (Signed)
Followup with Dr. Darrold Span in 6 months in with our service in 12 months.

## 2011-09-28 NOTE — Progress Notes (Signed)
Consult Note: Gyn-Onc  Carmen Garza 46 y.o. female  CC:  Chief Complaint  Patient presents with  . Ovarian Cancer    Follow up    HPI: Carmen Garza is a very pleasant 46 year old who in February 2011 was diagnosed with bilateral complex adnexal masses and elevated CA-125 of 145. She underwent exploratory laparotomy, TAHBSO, and appropriate  staging in March 2011. Operative findings included a 13-cm ovarian mass. Pathology was consistent with a grade 1 endometrioid adenocarcinoma arising within an atypical proliferation of an endometrioid tumor and endometriosis, final stage I C. She completed treatment with six cycles of paclitaxel and carboplatin with her last  cycle of chemotherapy in May-July 2011. Posttreatment CT scan was negative. Her CA-125 is completely normalized. I last saw her in September 2012, at which time, she was doing quite well. She continued feeling  well and wanted to wean herself off her Effexor. Unfortunately, over time as she has done that, she had noticed that she was having increasing headaches which were new for her.    Interval History:  She is overall feeling better than when I last saw her in September. Recently she has had a bit of increase with her hot flashes but attributes that to increased stress which should be getting better soon. Her headaches have diminished significantly. She has lost 14 pounds since we last saw her. She is taking phentermine. She's not exercising very much and most of the weight loss secondary to dietary  modifications. She had a mammogram in April and has a one year followup.  Review of Systems Her 10-point review of systems is otherwise negative with the exception of some increasing hot flashes. She denies any chest pain, shortness of breath, nausea, vomiting, fevers, chills, abdominal pain, change in bowel or bladder habits.  She does not have one today. There is no neurologic symptoms associated with that. She had a CA-125 done on  December 14, 2010, that was normal at 9.9.  She had a CA-125 in 3/13 that was 11.5.   Current Meds:  Outpatient Encounter Prescriptions as of 09/28/2011  Medication Sig Dispense Refill  . loratadine (CLARITIN) 10 MG tablet Take 10 mg by mouth daily.        . phentermine 37.5 MG capsule Take 37.5 mg by mouth every morning.      . pseudoephedrine (SUDAFED) 30 MG tablet Take 30 mg by mouth every 4 (four) hours as needed.        . venlafaxine (EFFEXOR-XR) 37.5 MG 24 hr capsule Take 37.5 mg by mouth daily.        Allergy: No Known Allergies  Social Hx:   History   Social History  . Marital Status: Married    Spouse Name: N/A    Number of Children: N/A  . Years of Education: N/A   Occupational History  . Not on file.   Social History Main Topics  . Smoking status: Former Smoker -- 0.5 packs/day for 12 years  . Smokeless tobacco: Not on file  . Alcohol Use: Yes  . Drug Use: No  . Sexually Active: Not on file   Other Topics Concern  . Not on file   Social History Narrative  . No narrative on file    Past Surgical Hx:  Past Surgical History  Procedure Date  . Abdominal hysterectomy 06/2009    Exp lap, TAH/BSO, staging  . Tonsillectomy age 27  . Cholecystectomy 06/2008    Lap    Past Medical Hx:  Past Medical History  Diagnosis Date  . Obesity   . Elevated cholesterol     Borderline  . Ovarian cancer 03/29/2011    ovarian carcinoma IC    Family Hx:  Family History  Problem Relation Age of Onset  . Colon cancer Mother   . Lung cancer Father   . Hypertension Sister   . Hypertension Brother   . Diabetes Maternal Grandmother     Vitals:  Blood pressure 108/68, pulse 80, temperature 97.8 F (36.6 C), temperature source Oral, resp. rate 16, height 5\' 2"  (1.575 m), weight 173 lb (78.472 kg).  Physical Exam: GENERAL: Well-nourished and well-developed female, in no acute distress.   NECK: Supple. There is no lymphadenopathy, no thyromegaly.   LUNGS: Clear to  auscultation bilaterally.   CARDIOVASCULAR: Regular rate and rhythm.    ABDOMEN: Shows well-healed vertical midline incision. Abdomen is soft, nontender, and nondistended. There are no palpable masses or hepatosplenomegaly. There is no incisional hernia. Groins are negative for adenopathy.   EXTREMITIES: There is no edema .  PELVIC: External genitalia is within normal limits. Bimanual examination reveals no masses or nodularity. Rectal confirms.  ASSESSMENT: A 46-year with stage I C grade I ovarian carcinoma, endometrioid type diagnosed in March 2011. She completed her last cycle of paclitaxel and carboplatin in July 2011, had a negative post- treatment CT scan. She clinically has no evidence of recurrent disease with a negative exam and negative cancer antigen-125.   PLAN: She will follow up with Dr. Darrold Span in 6 months for repeat evaluation of CA-125.  She should return to see [name] in 12 months.       Ayad Nieman A., MD 09/28/2011, 9:07 AM

## 2011-09-29 ENCOUNTER — Telehealth: Payer: Self-pay | Admitting: Gynecologic Oncology

## 2011-09-29 NOTE — Telephone Encounter (Signed)
Pt informed of CA 125 results: 11.9.  No questions or concerns voiced.

## 2011-12-04 ENCOUNTER — Other Ambulatory Visit: Payer: Self-pay | Admitting: Gastroenterology

## 2012-03-25 ENCOUNTER — Telehealth: Payer: Self-pay | Admitting: Oncology

## 2012-03-25 ENCOUNTER — Other Ambulatory Visit (HOSPITAL_BASED_OUTPATIENT_CLINIC_OR_DEPARTMENT_OTHER): Payer: BC Managed Care – PPO | Admitting: Lab

## 2012-03-25 ENCOUNTER — Other Ambulatory Visit: Payer: Self-pay | Admitting: Oncology

## 2012-03-25 ENCOUNTER — Encounter: Payer: Self-pay | Admitting: Oncology

## 2012-03-25 ENCOUNTER — Ambulatory Visit (HOSPITAL_BASED_OUTPATIENT_CLINIC_OR_DEPARTMENT_OTHER): Payer: BC Managed Care – PPO | Admitting: Oncology

## 2012-03-25 VITALS — BP 112/78 | HR 81 | Temp 97.5°F | Resp 20 | Ht 62.0 in | Wt 191.9 lb

## 2012-03-25 DIAGNOSIS — Z1231 Encounter for screening mammogram for malignant neoplasm of breast: Secondary | ICD-10-CM

## 2012-03-25 DIAGNOSIS — C569 Malignant neoplasm of unspecified ovary: Secondary | ICD-10-CM

## 2012-03-25 DIAGNOSIS — E669 Obesity, unspecified: Secondary | ICD-10-CM

## 2012-03-25 LAB — CBC WITH DIFFERENTIAL/PLATELET
BASO%: 0.8 % (ref 0.0–2.0)
MCHC: 33.6 g/dL (ref 31.5–36.0)
MONO#: 0.8 10*3/uL (ref 0.1–0.9)
RBC: 4.65 10*6/uL (ref 3.70–5.45)
WBC: 7.9 10*3/uL (ref 3.9–10.3)
lymph#: 3.1 10*3/uL (ref 0.9–3.3)

## 2012-03-25 NOTE — Patient Instructions (Signed)
Mammograms in April  Dr Duard Brady June

## 2012-03-25 NOTE — Progress Notes (Signed)
OFFICE PROGRESS NOTE   03/25/2012   Physicians:Randall Tiburcio Pea, P.Gehrig, M.Magod   INTERVAL HISTORY:  Patient is seen, alone for visit, in scheduled follow uo of her history of IC ovarian carcinoma, on observation since she completed adjuvant chemotherapy July 2011. She had colonoscopy done by Dr Ewing Schlein Aug 2013, this with history of colon cancer in her mother at age 45.  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 Surgery was TAH/BSO, bilateral pelvic and periaortic lymphadenectomy and omentectomy by Dr.Gehrig 06-22-2009, followed by 6 cycles of taxol/carboplatin thru July 2011. Last scans were CT AP 12/2009. She saw Dr.Gehrig last in June 2013 and will see her again in June 2014.   Patient tells me that she has felt entirely well recently, particularly with no abdominal or pelvic symptoms. She has had no recent infectious illness, no pain, no respiratory complaints, no bleeding, good energy. She has had no noted changes on breast self exam. She lost 14 lbs with a diet pill that made her jittery and caused difficulty sleeping, then gained all of this back.  Remainder of 10 point Review of Systems negative.  She is not eligible for GOG -0225 due to length of time out from treatment and < stage II, but I have told her the rationale of this study and recommended the increase in 4000 steps daily above baseline.  Her grandsons are ages 55 and 18, and new granddaughter 4 months. Objective:  Vital signs in last 24 hours:  BP 112/78  Pulse 81  Temp 97.5 F (36.4 C) (Oral)  Resp 20  Ht 5\' 2"  (1.575 m)  Wt 191 lb 14.4 oz (87.045 kg)  BMI 35.10 kg/m2 Weight is stable from a year ago.  Easily ambulatory, looks comfortable.   HEENT:PERRLA, sclera clear, anicteric and oropharynx clear, no lesions. Normal hair pattern.   LymphaticsCervical, supraclavicular, and axillary nodes normal. No inguinal adenopathy Resp: clear to auscultation bilaterally and normal percussion bilaterally Cardio: regular rate and rhythm GI: soft, non-tender; bowel sounds normal; no masses,  no organomegaly. Obese Extremities: extremities normal, atraumatic, no cyanosis or edema Neuro:no sensory deficits noted Breasts bilaterally:normal without suspicious masses, skin or nipple changes or axillary nodes Skin without lesions or ecchymoses  Lab Results:  Results for orders placed in visit on 03/25/12  CBC WITH DIFFERENTIAL      Component Value Range   WBC 7.9  3.9 - 10.3 10e3/uL   NEUT# 3.8  1.5 - 6.5 10e3/uL   HGB 14.0  11.6 - 15.9 g/dL   HCT 16.1  09.6 - 04.5 %   Platelets 243  145 - 400 10e3/uL   MCV 89.7  79.5 - 101.0 fL   MCH 30.2  25.1 - 34.0 pg   MCHC 33.6  31.5 - 36.0 g/dL   RBC 4.09  8.11 - 9.14 10e6/uL   RDW 12.8  11.2 - 14.5 %   lymph# 3.1  0.9 - 3.3 10e3/uL   MONO# 0.8  0.1 - 0.9 10e3/uL   Eosinophils Absolute 0.1  0.0 - 0.5 10e3/uL   Basophils Absolute 0.1  0.0 - 0.1 10e3/uL   NEUT% 48.0  38.4 - 76.8 %   LYMPH% 39.2  14.0 - 49.7 %   MONO% 10.6  0.0 - 14.0 %   EOS% 1.4  0.0 - 7.0 %   BASO% 0.8  0.0 - 2.0 %    CA  125 available after visit stable in good range at 11.9  Studies/Results:  Pathology [phone] from 12-04-11 tubular adenoma and hyperplastic polyp, negative for high grade dysplasia.   Colonoscopy report requested and received from Dr Marlane Hatcher office after patient's visit today: hemorrhoids, diverticulosis in sigmoid, 2 diminutive polyps in mid desc colon and prox asc colon with path above. He has recommended repeat colonoscopy in 5 years, which will be Aug 2018.  Mammograms at [facility] 07-25-11 with scattered fibroglandular tissue and no mammographic findings of concern  Medications: I have reviewed the patient's current medications. Flu vaccine already done this  fall  Assessment/Plan: 1.IC ovarian carcinoma: clinically continues to do well. Will follow up CA 125 pending. She will see Dr Duard Brady in June with CaA 125 shortly prior to that visit, then I will see her a year from now, with CBC,CMET,CA 125. 2.history of colon cancer in mother at age 36: colonoscopy done Aug 2013 as above, next in 5 years 3.obesity: she is aware that weight loss to ideal would be beneficial including from standpoint of cancer history 4.she had had flu vaccine    Reece Packer, MD   03/25/2012, 12:12 PM

## 2012-03-25 NOTE — Telephone Encounter (Signed)
appts made and printed for pt aom °

## 2012-03-26 LAB — CA 125: CA 125: 11.9 U/mL (ref 0.0–30.2)

## 2012-03-27 ENCOUNTER — Telehealth: Payer: Self-pay

## 2012-03-27 NOTE — Telephone Encounter (Signed)
Message copied by Lorine Bears on Wed Mar 27, 2012  5:24 PM ------      Message from: Jama Flavors P      Created: Wed Mar 27, 2012  1:22 PM       Labs seen and need follow up: please let her know CA 125 stable in good range at 11.9

## 2012-03-27 NOTE — Telephone Encounter (Signed)
Told Carmen Garza the results of her Ca-125 as noted below.

## 2012-07-25 ENCOUNTER — Ambulatory Visit: Payer: BC Managed Care – PPO

## 2012-08-05 ENCOUNTER — Ambulatory Visit
Admission: RE | Admit: 2012-08-05 | Discharge: 2012-08-05 | Disposition: A | Discharge status: Home / Self Care | Payer: BC Managed Care – PPO | Source: Ambulatory Visit | Attending: Oncology | Admitting: Oncology

## 2012-08-05 DIAGNOSIS — Z1231 Encounter for screening mammogram for malignant neoplasm of breast: Secondary | ICD-10-CM

## 2012-09-16 ENCOUNTER — Other Ambulatory Visit (HOSPITAL_BASED_OUTPATIENT_CLINIC_OR_DEPARTMENT_OTHER): Payer: BC Managed Care – PPO

## 2012-09-16 DIAGNOSIS — C569 Malignant neoplasm of unspecified ovary: Secondary | ICD-10-CM

## 2012-09-17 ENCOUNTER — Telehealth: Payer: Self-pay

## 2012-09-17 NOTE — Telephone Encounter (Signed)
Message copied by Kallie Locks on Tue Sep 17, 2012  3:19 PM ------      Message from: Lorine Bears      Created: Tue Sep 17, 2012  1:37 PM                   ----- Message -----         From: Reece Packer, MD         Sent: 09/17/2012  12:11 PM           To: Lorine Bears, RN            Labs seen and need follow up: please let her know this is still in good range at 10.6 ------

## 2012-09-19 ENCOUNTER — Ambulatory Visit: Payer: BC Managed Care – PPO | Attending: Gynecologic Oncology | Admitting: Gynecologic Oncology

## 2012-09-19 ENCOUNTER — Encounter: Payer: Self-pay | Admitting: Gynecologic Oncology

## 2012-09-19 VITALS — BP 106/68 | HR 64 | Temp 98.5°F | Resp 16 | Ht 62.0 in | Wt 190.6 lb

## 2012-09-19 DIAGNOSIS — Z90710 Acquired absence of both cervix and uterus: Secondary | ICD-10-CM | POA: Insufficient documentation

## 2012-09-19 DIAGNOSIS — E669 Obesity, unspecified: Secondary | ICD-10-CM | POA: Insufficient documentation

## 2012-09-19 DIAGNOSIS — Z9079 Acquired absence of other genital organ(s): Secondary | ICD-10-CM | POA: Insufficient documentation

## 2012-09-19 DIAGNOSIS — Z9221 Personal history of antineoplastic chemotherapy: Secondary | ICD-10-CM | POA: Insufficient documentation

## 2012-09-19 DIAGNOSIS — C569 Malignant neoplasm of unspecified ovary: Secondary | ICD-10-CM | POA: Insufficient documentation

## 2012-09-19 DIAGNOSIS — Z9089 Acquired absence of other organs: Secondary | ICD-10-CM | POA: Insufficient documentation

## 2012-09-19 DIAGNOSIS — Z79899 Other long term (current) drug therapy: Secondary | ICD-10-CM | POA: Insufficient documentation

## 2012-09-19 NOTE — Progress Notes (Signed)
Consult Note: Gyn-Onc  Lorin Picket 47 y.o. female  CC:  Chief Complaint  Patient presents with  . Ovarian Cancer    Follow up    HPI: Ms. Lowman is a very pleasant 47 year old who in February 2011 was diagnosed with bilateral complex adnexal masses and elevated CA-125 of 145. She underwent exploratory laparotomy, TAHBSO, and appropriate  staging in March 2011. Operative findings included a 13-cm ovarian mass. Pathology was consistent with a grade 1 endometrioid adenocarcinoma arising within an atypical proliferation of an endometrioid tumor and endometriosis, final stage I C. She completed treatment with six cycles of paclitaxel and carboplatin with her last cycle of chemotherapy in May-July 2011. Posttreatment CT scan was negative. Her CA-125 is completely normalized. I last saw her in June 2013, at which time, she was doing quite well. She saw Dr. Darrold Span 6 months ago and her exam was unremarkable and her CA 125 December 16 was 11.9. She had a CA 125 performed June ninth that was normal at 10.6.  Interval History:  She is overall feeling better than when I last saw her in September. She is taking phentermine again. She started about one week ago. When I saw her she was down 173 pounds she is now 190.6 pounds. She states that Dr. Tiburcio Pea wanted her to take it 3 months at that time. She is frustrated as the weight loss has been slow she's actually change her diet has been exercising as often as 3 times a day. She is hoping take care of her 68-year-old grandsons been running around more and is frustrated by the lack of weight loss. Her husband had a 2 vessel CABG January 14. He lost 14 pounds. She is up-to-date on her mammograms. She's not exercising very much and most of the weight loss secondary to dietary  modifications. She had a mammogram in April and has a one year followup.  Review of Systems Her 10-point review of systems is otherwise negative with the exception of some increasing hot  flashes. She denies any chest pain, shortness of breath, nausea, vomiting, fevers, chills, abdominal pain, change in bowel or bladder habits.  She does not have one today. There is no neurologic symptoms associated with that.  Current Meds:  Outpatient Encounter Prescriptions as of 09/19/2012  Medication Sig Dispense Refill  . loratadine (CLARITIN) 10 MG tablet Take 10 mg by mouth daily.        . phentermine 37.5 MG capsule Take 37.5 mg by mouth every morning.      . pseudoephedrine (SUDAFED) 30 MG tablet Take 30 mg by mouth every 4 (four) hours as needed.        . venlafaxine (EFFEXOR-XR) 37.5 MG 24 hr capsule Take 37.5 mg by mouth daily.       No facility-administered encounter medications on file as of 09/19/2012.    Allergy: No Known Allergies  Social Hx:   History   Social History  . Marital Status: Married    Spouse Name: N/A    Number of Children: N/A  . Years of Education: N/A   Occupational History  . Not on file.   Social History Main Topics  . Smoking status: Former Smoker -- 0.50 packs/day for 12 years  . Smokeless tobacco: Not on file  . Alcohol Use: Yes  . Drug Use: No  . Sexually Active: Not on file   Other Topics Concern  . Not on file   Social History Narrative  . No narrative on file  Past Surgical Hx:  Past Surgical History  Procedure Laterality Date  . Abdominal hysterectomy  06/2009    Exp lap, TAH/BSO, staging  . Tonsillectomy  age 27  . Cholecystectomy  06/2008    Lap    Past Medical Hx:  Past Medical History  Diagnosis Date  . Obesity   . Elevated cholesterol     Borderline  . Ovarian cancer 03/29/2011    ovarian carcinoma IC    Family Hx:  Family History  Problem Relation Age of Onset  . Colon cancer Mother   . Lung cancer Father   . Hypertension Sister   . Hypertension Brother   . Diabetes Maternal Grandmother     Vitals:  Blood pressure 106/68, pulse 64, temperature 98.5 F (36.9 C), temperature source Oral, resp. rate  16, height 5\' 2"  (1.575 m), weight 190 lb 9.6 oz (86.456 kg).  Physical Exam: GENERAL: Well-nourished and well-developed female, in no acute distress.   NECK: Supple. There is no lymphadenopathy, no thyromegaly.   LUNGS: Clear to auscultation bilaterally.   CARDIOVASCULAR: Regular rate and rhythm.    ABDOMEN: Shows well-healed vertical midline incision. Abdomen is soft, nontender, and nondistended. There are no palpable masses or hepatosplenomegaly. There is no incisional hernia. Groins are negative for adenopathy.   EXTREMITIES: There is no edema .  PELVIC: External genitalia is within normal limits. Bimanual examination reveals no masses or nodularity. Rectal confirms.  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 She completed her last cycle of paclitaxel and carboplatin in July 2011, had a negative post- treatment CT scan. She clinically has no evidence of recurrent disease with a negative exam and negative cancer antigen-125.   PLAN: She will follow up with [name] in 6 months with Dr. Darrold Span is leaving the practice here. We'll continue her followup with CA 125 every 6 months through her 5 year anniversary.       Abbigayle Toole A., MD 09/19/2012, 12:03 PM

## 2013-03-19 ENCOUNTER — Ambulatory Visit: Payer: BC Managed Care – PPO | Admitting: Gynecologic Oncology

## 2013-03-24 ENCOUNTER — Telehealth: Payer: Self-pay | Admitting: Internal Medicine

## 2013-03-24 NOTE — Telephone Encounter (Signed)
s.w. pt and cx appt...did not want to come will just see Dr. Duard Brady

## 2013-03-25 ENCOUNTER — Other Ambulatory Visit: Payer: BC Managed Care – PPO

## 2013-03-25 ENCOUNTER — Ambulatory Visit: Payer: BC Managed Care – PPO

## 2013-04-30 ENCOUNTER — Encounter: Payer: Self-pay | Admitting: Gynecologic Oncology

## 2013-04-30 ENCOUNTER — Ambulatory Visit (HOSPITAL_BASED_OUTPATIENT_CLINIC_OR_DEPARTMENT_OTHER): Payer: BC Managed Care – PPO

## 2013-04-30 ENCOUNTER — Ambulatory Visit: Payer: BC Managed Care – PPO | Attending: Gynecologic Oncology | Admitting: Gynecologic Oncology

## 2013-04-30 VITALS — BP 105/46 | HR 91 | Temp 98.0°F | Resp 20 | Wt 178.7 lb

## 2013-04-30 DIAGNOSIS — C569 Malignant neoplasm of unspecified ovary: Secondary | ICD-10-CM

## 2013-04-30 DIAGNOSIS — E7800 Pure hypercholesterolemia, unspecified: Secondary | ICD-10-CM | POA: Insufficient documentation

## 2013-04-30 DIAGNOSIS — Z9079 Acquired absence of other genital organ(s): Secondary | ICD-10-CM | POA: Insufficient documentation

## 2013-04-30 DIAGNOSIS — Z87891 Personal history of nicotine dependence: Secondary | ICD-10-CM | POA: Insufficient documentation

## 2013-04-30 DIAGNOSIS — Z90710 Acquired absence of both cervix and uterus: Secondary | ICD-10-CM | POA: Insufficient documentation

## 2013-04-30 DIAGNOSIS — E669 Obesity, unspecified: Secondary | ICD-10-CM | POA: Insufficient documentation

## 2013-04-30 DIAGNOSIS — Z9221 Personal history of antineoplastic chemotherapy: Secondary | ICD-10-CM | POA: Insufficient documentation

## 2013-04-30 DIAGNOSIS — Z79899 Other long term (current) drug therapy: Secondary | ICD-10-CM | POA: Insufficient documentation

## 2013-04-30 LAB — CA 125: CA 125: 10 U/mL (ref 0.0–30.2)

## 2013-04-30 NOTE — Patient Instructions (Signed)
Return to clinic in 6 months.

## 2013-04-30 NOTE — Progress Notes (Signed)
Consult Note: Gyn-Onc  Carmen Garza 48 y.o. female  CC:  Chief Complaint  Patient presents with  . Ovarian Cancer    HPI: [name] is a very pleasant 48 year old who in February 2011 was diagnosed with bilateral complex adnexal masses and elevated CA-125 of 145. She underwent exploratory laparotomy, TAHBSO, and appropriate  staging in March 2011. Operative findings included a 13-cm ovarian mass. Pathology was consistent with a grade 1 endometrioid adenocarcinoma arising within an atypical proliferation of an endometrioid tumor and endometriosis, final stage I C. She completed treatment with six cycles of paclitaxel and carboplatin with her last cycle of chemotherapy in May-July 2011. Posttreatment CT scan was negative. Her CA-125 is completely normalized.   Interval History:  Her weight is down to 178.7 from 190.6 lbs. she is no longer taking her (mean is modified her diet significantly. She's making healthcare choices. She ran out of her Effexor and stopped taking it for a few days and felt very ill. She experienced nausea. Now that she has a prescription refilled she's feeling better. She is due for her mammogram. There's no new medical problems in the family.  Review of Systems     Constitutional: Denies fever. Skin: No rash, sores, jaundice, itching, or dryness.  Cardiovascular: No chest pain, shortness of breath, or edema  Pulmonary: No cough or wheeze.  Gastro Intestinal: No nausea, vomiting, constipation, or diarrhea reported. No bright red blood per rectum or change in bowel movement.  Genitourinary: No frequency, urgency, or dysuria.  Denies vaginal bleeding and discharge.  Musculoskeletal: No myalgia, arthralgia, joint swelling or pain.  Neurologic: No weakness, numbness, or change in gait.  Psychology: no changes    Current Meds:  Outpatient Encounter Prescriptions as of 04/30/2013  Medication Sig  . loratadine (CLARITIN) 10 MG tablet Take 10 mg by mouth daily.    .  pseudoephedrine (SUDAFED) 30 MG tablet Take 30 mg by mouth every 4 (four) hours as needed.    . venlafaxine (EFFEXOR-XR) 37.5 MG 24 hr capsule Take 37.5 mg by mouth daily.  . [DISCONTINUED] phentermine 37.5 MG capsule Take 37.5 mg by mouth every morning.    Allergy: No Known Allergies  Social Hx:   History   Social History  . Marital Status: Married    Spouse Name: N/A    Number of Children: N/A  . Years of Education: N/A   Occupational History  . Not on file.   Social History Main Topics  . Smoking status: Former Smoker -- 0.50 packs/day for 12 years  . Smokeless tobacco: Not on file  . Alcohol Use: Yes  . Drug Use: No  . Sexual Activity: Not on file   Other Topics Concern  . Not on file   Social History Narrative  . No narrative on file    Past Surgical Hx:  Past Surgical History  Procedure Laterality Date  . Abdominal hysterectomy  06/2009    Exp lap, TAH/BSO, staging  . Tonsillectomy  age 38  . Cholecystectomy  06/2008    Lap    Past Medical Hx:  Past Medical History  Diagnosis Date  . Obesity   . Elevated cholesterol     Borderline  . Ovarian cancer 03/29/2011    ovarian carcinoma IC    Oncology Hx:    Ovarian cancer   05/29/2009 Initial Diagnosis Ovarian cancer   06/28/2009 Surgery IC ovarian cancer    - 10/28/2009 Chemotherapy 6 cycles of paclitaxel and carboplatin    Family  Hx:  Family History  Problem Relation Age of Onset  . Colon cancer Mother   . Lung cancer Father   . Hypertension Sister   . Hypertension Brother   . Diabetes Maternal Grandmother     Vitals:  Blood pressure 105/46, pulse 91, temperature 98 F (36.7 C), temperature source Oral, resp. rate 20, weight 178 lb 11.2 oz (81.058 kg).  Physical Exam:  GENERAL: Well-nourished and well-developed female, in no acute distress.   NECK: Supple. There is no lymphadenopathy, no thyromegaly.   LUNGS: Clear to auscultation bilaterally.   CARDIOVASCULAR: Regular rate and rhythm.    ABDOMEN: Shows well-healed vertical midline incision. Abdomen is soft, nontender, and nondistended. There are no palpable masses or hepatosplenomegaly. There is no incisional hernia. Groins are negative for adenopathy.   EXTREMITIES: There is no edema  .  PELVIC: External genitalia is within normal limits. Bimanual examination reveals no masses or nodularity. Rectal confirms.   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 She completed her last cycle of paclitaxel and carboplatin in July 2011, had a negative post- treatment CT scan. She clinically has no evidence of recurrent disease with a negative exam and negative cancer antigen-125.   PLAN:   We will followup in results of her CA 125 from today. Return to see me in 6 months.  Sye Schroepfer A., MD 04/30/2013, 1:58 PM

## 2013-05-02 ENCOUNTER — Telehealth: Payer: Self-pay | Admitting: Gynecologic Oncology

## 2013-05-02 NOTE — Telephone Encounter (Signed)
Patient notified of CA 125 results.  No concerns voiced.

## 2013-05-02 NOTE — Addendum Note (Signed)
Addended by: Joylene John D on: 05/02/2013 03:52 PM   Modules accepted: Level of Service

## 2013-06-13 ENCOUNTER — Other Ambulatory Visit: Payer: Self-pay | Admitting: Family Medicine

## 2013-06-13 ENCOUNTER — Ambulatory Visit
Admission: RE | Admit: 2013-06-13 | Discharge: 2013-06-13 | Disposition: A | Discharge status: Home / Self Care | Payer: BC Managed Care – PPO | Source: Ambulatory Visit | Attending: Family Medicine | Admitting: Family Medicine

## 2013-06-13 DIAGNOSIS — C569 Malignant neoplasm of unspecified ovary: Secondary | ICD-10-CM

## 2013-06-13 DIAGNOSIS — M545 Low back pain, unspecified: Secondary | ICD-10-CM

## 2013-06-13 DIAGNOSIS — M79651 Pain in right thigh: Secondary | ICD-10-CM

## 2013-06-13 DIAGNOSIS — M25551 Pain in right hip: Secondary | ICD-10-CM

## 2013-07-16 ENCOUNTER — Other Ambulatory Visit: Payer: Self-pay | Admitting: Family Medicine

## 2013-07-16 DIAGNOSIS — M5416 Radiculopathy, lumbar region: Secondary | ICD-10-CM

## 2013-07-19 ENCOUNTER — Ambulatory Visit
Admission: RE | Admit: 2013-07-19 | Discharge: 2013-07-19 | Disposition: A | Discharge status: Home / Self Care | Payer: BC Managed Care – PPO | Source: Ambulatory Visit | Attending: Family Medicine | Admitting: Family Medicine

## 2013-07-19 DIAGNOSIS — M5416 Radiculopathy, lumbar region: Secondary | ICD-10-CM

## 2013-08-06 ENCOUNTER — Emergency Department (INDEPENDENT_AMBULATORY_CARE_PROVIDER_SITE_OTHER): Payer: BC Managed Care – PPO

## 2013-08-06 ENCOUNTER — Emergency Department (HOSPITAL_COMMUNITY)
Admission: EM | Admit: 2013-08-06 | Discharge: 2013-08-06 | Disposition: A | Discharge status: Home / Self Care | Payer: BC Managed Care – PPO | Source: Home / Self Care | Attending: Family Medicine | Admitting: Family Medicine

## 2013-08-06 ENCOUNTER — Encounter (HOSPITAL_COMMUNITY): Payer: Self-pay | Admitting: Emergency Medicine

## 2013-08-06 DIAGNOSIS — W108XXA Fall (on) (from) other stairs and steps, initial encounter: Secondary | ICD-10-CM

## 2013-08-06 DIAGNOSIS — S42002A Fracture of unspecified part of left clavicle, initial encounter for closed fracture: Secondary | ICD-10-CM

## 2013-08-06 DIAGNOSIS — Y92009 Unspecified place in unspecified non-institutional (private) residence as the place of occurrence of the external cause: Secondary | ICD-10-CM

## 2013-08-06 DIAGNOSIS — S42009A Fracture of unspecified part of unspecified clavicle, initial encounter for closed fracture: Secondary | ICD-10-CM

## 2013-08-06 MED ORDER — KETOROLAC TROMETHAMINE 30 MG/ML IJ SOLN
30.0000 mg | Freq: Once | INTRAMUSCULAR | Status: AC
  Administered 2013-08-06: 30 mg via INTRAMUSCULAR

## 2013-08-06 MED ORDER — KETOROLAC TROMETHAMINE 30 MG/ML IJ SOLN
INTRAMUSCULAR | Status: AC
  Filled 2013-08-06: qty 1

## 2013-08-06 MED ORDER — HYDROCODONE-ACETAMINOPHEN 5-325 MG PO TABS
2.0000 | ORAL_TABLET | Freq: Once | ORAL | Status: AC
  Administered 2013-08-06: 2 via ORAL

## 2013-08-06 MED ORDER — HYDROCODONE-ACETAMINOPHEN 5-325 MG PO TABS
ORAL_TABLET | ORAL | Status: AC
  Filled 2013-08-06: qty 2

## 2013-08-06 MED ORDER — OXYCODONE-ACETAMINOPHEN 5-325 MG PO TABS: 1.0000 | ORAL_TABLET | Freq: Four times a day (QID) | ORAL | Status: DC | PRN

## 2013-08-06 NOTE — Discharge Instructions (Signed)
I have spoken with Dr. Tamera Punt, the orthopedist on call for [place], about your injury. Please call his office tomorrow for follow up appointment with him on Friday, Aug 08, 2013. Wear sling during the day. Ice area 3-4 times a day. Sleep with head and shoulders elevated to reduce swelling. Medication as directed for pain.  Clavicle Fracture A clavicle fracture is a break in the collarbone. This is a common injury, especially in children. Collarbones do not harden until around the age of 64. Most collarbone fractures are treated with a simple arm sling. In some cases a figure-of-eight splint is used to help hold the broken bones in position. Although not often needed, surgery may be required if the bone fragments are not in the correct position (displaced).  HOME CARE INSTRUCTIONS   Apply ice to the injury for 15-20 minutes each hour while awake for 2 days. Put the ice in a plastic bag and place a towel between the bag of ice and your skin.  Wear the sling or splint constantly for as long as directed by your caregiver. You may remove the sling or splint for bathing or showering. Be sure to keep your shoulder in the same place as when the sling or splint is on. Do not lift your arm.  If a figure-of-eight splint is applied, it must be tightened by another person every day. Tighten it enough to keep the shoulders held back. Allow enough room to place the index finger between the body and strap. Loosen the splint immediately if you feel numbness or tingling in your hands.  Only take over-the-counter or prescription medicines for pain, discomfort, or fever as directed by your caregiver.  Avoid activities that irritate or increase the pain for 4 to 6 weeks after surgery.  Follow all instructions for follow-up with your caregiver. This includes any referrals, physical therapy, and rehabilitation. Any delay in obtaining necessary care could result in a delay or failure of the injury to heal  properly. SEEK MEDICAL CARE IF:  You have pain and swelling that are not relieved with medications. SEEK IMMEDIATE MEDICAL CARE IF:  Your arm is numb, cold, or pale, even when the splint is loose. MAKE SURE YOU:   Understand these instructions.  Will watch your condition.  Will get help right away if you are not doing well or get worse. Document Released: 01/04/2005 Document Revised: 06/19/2011 Document Reviewed: 10/31/2007 [facility] Patient Information 2014 Page.

## 2013-08-06 NOTE — ED Notes (Signed)
Reportedly fell this afternoon, and missed most of the steps on the stairs. Pain left shoulder, left clavicle area. ?deformity ? Clavicle area . Reports good sensation in hand, unsure of LOC , does not recall LOC or head injury

## 2013-08-06 NOTE — ED Provider Notes (Signed)
CSN: [id number]     Arrival date & time 08/06/13  1905 History   First MD Initiated Contact with Patient 08/06/13 2031     Chief Complaint  Patient presents with  . Fall   (Consider location/radiation/quality/duration/timing/severity/associated sxs/prior Treatment) HPI Comments: Patient states she stumbled and fell down her stairs at home this evening injuring her left shoulder. Denies head trauma or LOC. No (revious injury or surgery PCP: Dr. Kenton Kingfisher @ [facility]: [place]  Patient is a 48 y.o. female presenting with fall. The history is provided by the patient.  Fall This is a new problem.    Past Medical History  Diagnosis Date  . Obesity   . Elevated cholesterol     Borderline  . Ovarian cancer 03/29/2011    ovarian carcinoma IC   Past Surgical History  Procedure Laterality Date  . Abdominal hysterectomy  06/2009    Exp lap, TAH/BSO, staging  . Tonsillectomy  age 36  . Cholecystectomy  06/2008    Lap   Family History  Problem Relation Age of Onset  . Colon cancer Mother   . Lung cancer Father   . Hypertension Sister   . Hypertension Brother   . Diabetes Maternal Grandmother    History  Substance Use Topics  . Smoking status: Former Smoker -- 0.50 packs/day for 12 years  . Smokeless tobacco: Not on file  . Alcohol Use: Yes   OB History   Grav Para Term Preterm Abortions TAB SAB Ect Mult Living                 Review of Systems  All other systems reviewed and are negative.   Allergies  Review of patient's allergies indicates no known allergies.  Home Medications   Prior to Admission medications   Medication Sig Start Date End Date Taking? Authorizing Provider  loratadine (CLARITIN) 10 MG tablet Take 10 mg by mouth daily.     Yes Historical Provider, MD  venlafaxine (EFFEXOR-XR) 37.5 MG 24 hr capsule Take 37.5 mg by mouth daily.   Yes Historical Provider, MD  pseudoephedrine (SUDAFED) 30 MG tablet Take 30 mg by mouth every 4 (four) hours as  needed.      Historical Provider, MD   BP 135/89  Pulse 97  Temp(Src) 98.1 F (36.7 C) (Oral)  Resp 16  SpO2 98% Physical Exam  Nursing note and vitals reviewed. Constitutional: She is oriented to person, place, and time. She appears well-developed and well-nourished.  HENT:  Head: Normocephalic and atraumatic.  Eyes: Conjunctivae are normal.  Neck: Trachea normal, normal range of motion, full passive range of motion without pain and phonation normal. Neck supple. No spinous process tenderness present.  Cardiovascular: Normal rate, regular rhythm and normal heart sounds.   Pulmonary/Chest: Effort normal and breath sounds normal. No respiratory distress. She has no wheezes.  Musculoskeletal:       Left shoulder: She exhibits decreased range of motion, tenderness, bony tenderness, swelling, deformity and pain. She exhibits no effusion, no crepitus, no laceration, no spasm, normal pulse and normal strength.  CSM exam of LUE normal. No tenting of the skin but with moderate sized hematoma overlying area of fracture.   Neurological: She is alert and oriented to person, place, and time.  Skin: Skin is warm and dry. No rash noted.  +intact  Psychiatric: She has a normal mood and affect. Her behavior is normal.    ED Course  Procedures (including critical care time) Labs Review Labs Reviewed -  No data to display  Imaging Review Dg Clavicle Left  08/06/2013   CLINICAL DATA:  [place].  Clavicle pain.  EXAM: LEFT CLAVICLE - 2+ VIEWS  COMPARISON:  None.  FINDINGS: There is a displaced distal clavicle fracture with comminution. Marked widening of the coracoclavicular space consistent with associated ligament rupture.  IMPRESSION: Displaced and comminuted distal clavicle fracture.  Marked widening of the coracoclavicular distance consistent with ligamentous disruption.   Electronically Signed   By: Kalman Jewels M.D.   On: 08/06/2013 20:59   Dg Shoulder Left  08/06/2013   CLINICAL DATA:  [place].   Clavicle pain and deformity.  EXAM: LEFT SHOULDER - 2+ VIEW  COMPARISON:  None.  FINDINGS: There is a displaced fracture of the distal clavicle with 1 shaft width of superior displacement. The [facility] joint is intact. The glenohumeral joint is maintained.  IMPRESSION: Displaced distal clavicle fracture with comminution.   Electronically Signed   By: Kalman Jewels M.D.   On: 08/06/2013 20:58     MDM   1. Fracture of clavicle, left, closed   Sling, ice and elevation. Case discussed with Dr. Tamera Punt, orthopedist on call for [facility], and he advised me to tell patient to call his office to arrange for appointment with him on Friday 08-08-2013. Patient advised to sleep with torso elevated.     [place], [place] 08/06/13 2135

## 2013-08-08 ENCOUNTER — Other Ambulatory Visit: Payer: Self-pay | Admitting: Orthopedic Surgery

## 2013-08-08 ENCOUNTER — Encounter (HOSPITAL_BASED_OUTPATIENT_CLINIC_OR_DEPARTMENT_OTHER): Payer: Self-pay | Admitting: *Deleted

## 2013-08-08 NOTE — ED Provider Notes (Signed)
Medical screening examination/treatment/procedure(s) were performed by a resident physician or non-physician practitioner and as the supervising physician I was immediately available for consultation/collaboration.  Lynne Leader, MD    Gregor Hams, MD 08/08/13 [phone]

## 2013-08-12 ENCOUNTER — Ambulatory Visit (HOSPITAL_COMMUNITY): Payer: BC Managed Care – PPO

## 2013-08-12 ENCOUNTER — Encounter (HOSPITAL_BASED_OUTPATIENT_CLINIC_OR_DEPARTMENT_OTHER): Payer: BC Managed Care – PPO | Admitting: Anesthesiology

## 2013-08-12 ENCOUNTER — Ambulatory Visit (HOSPITAL_BASED_OUTPATIENT_CLINIC_OR_DEPARTMENT_OTHER)
Admission: RE | Admit: 2013-08-12 | Discharge: 2013-08-12 | Disposition: A | Discharge status: Home / Self Care | Payer: BC Managed Care – PPO | Source: Ambulatory Visit | Attending: Orthopedic Surgery | Admitting: Orthopedic Surgery

## 2013-08-12 ENCOUNTER — Encounter (HOSPITAL_BASED_OUTPATIENT_CLINIC_OR_DEPARTMENT_OTHER)
Admission: RE | Disposition: A | Discharge status: Home / Self Care | Payer: Self-pay | Source: Ambulatory Visit | Attending: Orthopedic Surgery

## 2013-08-12 ENCOUNTER — Encounter (HOSPITAL_BASED_OUTPATIENT_CLINIC_OR_DEPARTMENT_OTHER): Payer: Self-pay

## 2013-08-12 ENCOUNTER — Ambulatory Visit (HOSPITAL_BASED_OUTPATIENT_CLINIC_OR_DEPARTMENT_OTHER): Payer: BC Managed Care – PPO | Admitting: Anesthesiology

## 2013-08-12 DIAGNOSIS — S42033A Displaced fracture of lateral end of unspecified clavicle, initial encounter for closed fracture: Secondary | ICD-10-CM

## 2013-08-12 DIAGNOSIS — Z79899 Other long term (current) drug therapy: Secondary | ICD-10-CM | POA: Insufficient documentation

## 2013-08-12 DIAGNOSIS — W19XXXA Unspecified fall, initial encounter: Secondary | ICD-10-CM | POA: Insufficient documentation

## 2013-08-12 DIAGNOSIS — E669 Obesity, unspecified: Secondary | ICD-10-CM | POA: Insufficient documentation

## 2013-08-12 DIAGNOSIS — Y929 Unspecified place or not applicable: Secondary | ICD-10-CM | POA: Insufficient documentation

## 2013-08-12 DIAGNOSIS — Z8543 Personal history of malignant neoplasm of ovary: Secondary | ICD-10-CM | POA: Insufficient documentation

## 2013-08-12 DIAGNOSIS — Z6832 Body mass index (BMI) 32.0-32.9, adult: Secondary | ICD-10-CM | POA: Insufficient documentation

## 2013-08-12 DIAGNOSIS — S42009A Fracture of unspecified part of unspecified clavicle, initial encounter for closed fracture: Secondary | ICD-10-CM | POA: Insufficient documentation

## 2013-08-12 DIAGNOSIS — Z87891 Personal history of nicotine dependence: Secondary | ICD-10-CM | POA: Insufficient documentation

## 2013-08-12 HISTORY — DX: Presence of spectacles and contact lenses: Z97.3

## 2013-08-12 HISTORY — PX: ORIF CLAVICULAR FRACTURE: SHX5055

## 2013-08-12 SURGERY — OPEN REDUCTION INTERNAL FIXATION (ORIF) CLAVICULAR FRACTURE
Anesthesia: General | Site: Shoulder | Laterality: Left

## 2013-08-12 MED ORDER — ONDANSETRON HCL 4 MG/2ML IJ SOLN
INTRAMUSCULAR | Status: DC | PRN
Start: 2013-08-12 — End: 2013-08-12
  Administered 2013-08-12: 4 mg via INTRAVENOUS

## 2013-08-12 MED ORDER — SUCCINYLCHOLINE CHLORIDE 20 MG/ML IJ SOLN
INTRAMUSCULAR | Status: DC | PRN
  Administered 2013-08-12: 100 mg via INTRAVENOUS

## 2013-08-12 MED ORDER — BUPIVACAINE HCL (PF) 0.5 % IJ SOLN
INTRAMUSCULAR | Status: AC
  Filled 2013-08-12: qty 30

## 2013-08-12 MED ORDER — PROPOFOL 10 MG/ML IV BOLUS
INTRAVENOUS | Status: AC
  Filled 2013-08-12: qty 20

## 2013-08-12 MED ORDER — MIDAZOLAM HCL 2 MG/2ML IJ SOLN
INTRAMUSCULAR | Status: AC
  Filled 2013-08-12: qty 2

## 2013-08-12 MED ORDER — OXYCODONE-ACETAMINOPHEN 5-325 MG PO TABS: 1.0000 | ORAL_TABLET | ORAL | Status: DC | PRN

## 2013-08-12 MED ORDER — EPHEDRINE SULFATE 50 MG/ML IJ SOLN
INTRAMUSCULAR | Status: DC | PRN
  Administered 2013-08-12: 10 mg via INTRAVENOUS

## 2013-08-12 MED ORDER — LIDOCAINE-EPINEPHRINE 1 %-1:100000 IJ SOLN
INTRAMUSCULAR | Status: AC
  Filled 2013-08-12: qty 1

## 2013-08-12 MED ORDER — BACITRACIN ZINC 500 UNIT/GM EX OINT
TOPICAL_OINTMENT | CUTANEOUS | Status: AC
  Filled 2013-08-12: qty 0.9

## 2013-08-12 MED ORDER — FENTANYL CITRATE 0.05 MG/ML IJ SOLN
50.0000 ug | INTRAMUSCULAR | Status: DC | PRN
  Administered 2013-08-12: 100 ug via INTRAVENOUS

## 2013-08-12 MED ORDER — BUPIVACAINE-EPINEPHRINE (PF) 0.5% -1:200000 IJ SOLN
INTRAMUSCULAR | Status: DC | PRN
  Administered 2013-08-12: 23 mL

## 2013-08-12 MED ORDER — FENTANYL CITRATE 0.05 MG/ML IJ SOLN
INTRAMUSCULAR | Status: DC | PRN
  Administered 2013-08-12: 50 ug via INTRAVENOUS
  Administered 2013-08-12 (×2): 25 ug via INTRAVENOUS
  Administered 2013-08-12 (×2): 50 ug via INTRAVENOUS

## 2013-08-12 MED ORDER — CEFAZOLIN SODIUM-DEXTROSE 2-3 GM-% IV SOLR
INTRAVENOUS | Status: AC
  Filled 2013-08-12: qty 50

## 2013-08-12 MED ORDER — FENTANYL CITRATE 0.05 MG/ML IJ SOLN
INTRAMUSCULAR | Status: AC
  Filled 2013-08-12: qty 8

## 2013-08-12 MED ORDER — POVIDONE-IODINE 7.5 % EX SOLN: Freq: Once | CUTANEOUS | Status: DC

## 2013-08-12 MED ORDER — LACTATED RINGERS IV SOLN
INTRAVENOUS | Status: DC
  Administered 2013-08-12 (×2): via INTRAVENOUS

## 2013-08-12 MED ORDER — FENTANYL CITRATE 0.05 MG/ML IJ SOLN
INTRAMUSCULAR | Status: AC
  Filled 2013-08-12: qty 2

## 2013-08-12 MED ORDER — OXYCODONE HCL 5 MG PO TABS: 5.0000 mg | ORAL_TABLET | Freq: Once | ORAL | Status: DC | PRN

## 2013-08-12 MED ORDER — ONDANSETRON HCL 4 MG/2ML IJ SOLN: 4.0000 mg | Freq: Once | INTRAMUSCULAR | Status: DC | PRN

## 2013-08-12 MED ORDER — DEXAMETHASONE SODIUM PHOSPHATE 4 MG/ML IJ SOLN
INTRAMUSCULAR | Status: DC | PRN
Start: 2013-08-12 — End: 2013-08-12
  Administered 2013-08-12: 10 mg via INTRAVENOUS

## 2013-08-12 MED ORDER — OXYCODONE HCL 5 MG/5ML PO SOLN: 5.0000 mg | Freq: Once | ORAL | Status: DC | PRN

## 2013-08-12 MED ORDER — PROPOFOL 10 MG/ML IV BOLUS
INTRAVENOUS | Status: DC | PRN
  Administered 2013-08-12: 180 mg via INTRAVENOUS

## 2013-08-12 MED ORDER — HYDROMORPHONE HCL PF 1 MG/ML IJ SOLN: 0.2500 mg | INTRAMUSCULAR | Status: DC | PRN

## 2013-08-12 MED ORDER — LIDOCAINE HCL (CARDIAC) 10 MG/ML IV SOLN
INTRAVENOUS | Status: DC | PRN
  Administered 2013-08-12: 80 mg via INTRAVENOUS

## 2013-08-12 MED ORDER — CEFAZOLIN SODIUM-DEXTROSE 2-3 GM-% IV SOLR
2.0000 g | INTRAVENOUS | Status: AC
  Administered 2013-08-12: 2 g via INTRAVENOUS

## 2013-08-12 MED ORDER — MIDAZOLAM HCL 2 MG/2ML IJ SOLN
1.0000 mg | INTRAMUSCULAR | Status: DC | PRN
  Administered 2013-08-12: 2 mg via INTRAVENOUS

## 2013-08-12 MED ORDER — DOCUSATE SODIUM 100 MG PO CAPS: 100.0000 mg | ORAL_CAPSULE | Freq: Three times a day (TID) | ORAL | Status: DC | PRN

## 2013-08-12 SURGICAL SUPPLY — 56 items
BLADE 15 SAFETY STRL DISP (BLADE) ×2 IMPLANT
BLADE SURG ROTATE 9660 (MISCELLANEOUS) IMPLANT
CANISTER SUCT 3000ML (MISCELLANEOUS) IMPLANT
CHLORAPREP W/TINT 26ML (MISCELLANEOUS) ×2 IMPLANT
DECANTER SPIKE VIAL GLASS SM (MISCELLANEOUS) IMPLANT
DRAPE C-ARM 42X72 X-RAY (DRAPES) ×2 IMPLANT
DRAPE INCISE IOBAN 66X45 STRL (DRAPES) ×2 IMPLANT
DRAPE SURG 17X23 STRL (DRAPES) ×2 IMPLANT
DRAPE U 20/CS (DRAPES) ×2 IMPLANT
DRAPE U-SHAPE 47X51 STRL (DRAPES) ×2 IMPLANT
DRAPE U-SHAPE 76X120 STRL (DRAPES) ×4 IMPLANT
DRSG TEGADERM 4X4.75 (GAUZE/BANDAGES/DRESSINGS) ×3 IMPLANT
ELECT REM PT RETURN 9FT ADLT (ELECTROSURGICAL) ×2
ELECTRODE REM PT RTRN 9FT ADLT (ELECTROSURGICAL) ×1 IMPLANT
GAUZE SPONGE 4X4 12PLY STRL (GAUZE/BANDAGES/DRESSINGS) ×2 IMPLANT
GLOVE BIO SURGEON STRL SZ7 (GLOVE) ×2 IMPLANT
GLOVE BIO SURGEON STRL SZ7.5 (GLOVE) ×2 IMPLANT
GLOVE BIOGEL PI IND STRL 7.0 (GLOVE) ×1 IMPLANT
GLOVE BIOGEL PI IND STRL 8 (GLOVE) ×1 IMPLANT
GLOVE BIOGEL PI INDICATOR 7.0 (GLOVE) ×2
GLOVE BIOGEL PI INDICATOR 8 (GLOVE) ×1
GLOVE ECLIPSE 6.5 STRL STRAW (GLOVE) ×1 IMPLANT
GLOVE EXAM NITRILE MD LF STRL (GLOVE) ×1 IMPLANT
GOWN STRL REUS W/ TWL LRG LVL3 (GOWN DISPOSABLE) ×1 IMPLANT
GOWN STRL REUS W/TWL LRG LVL3 (GOWN DISPOSABLE) ×4
KIT AC JOINT DISP (KITS) ×1 IMPLANT
NS IRRIG 1000ML POUR BTL (IV SOLUTION) ×2 IMPLANT
PACK ARTHROSCOPY DSU (CUSTOM PROCEDURE TRAY) ×2 IMPLANT
PACK BASIN DAY SURGERY FS (CUSTOM PROCEDURE TRAY) ×2 IMPLANT
PENCIL BUTTON HOLSTER BLD 10FT (ELECTRODE) ×2 IMPLANT
RETRIEVER SUT HEWSON (MISCELLANEOUS) ×1 IMPLANT
SLEEVE SCD COMPRESS KNEE MED (MISCELLANEOUS) ×1 IMPLANT
SLING ARM IMMOBILIZER MED (SOFTGOODS) IMPLANT
SLING ARM LRG ADULT FOAM STRAP (SOFTGOODS) IMPLANT
SLING ARM MED ADULT FOAM STRAP (SOFTGOODS) ×1 IMPLANT
SLING ARM XL FOAM STRAP (SOFTGOODS) IMPLANT
SPONGE LAP 18X18 X RAY DECT (DISPOSABLE) ×2 IMPLANT
STRIP CLOSURE SKIN 1/2X4 (GAUZE/BANDAGES/DRESSINGS) ×2 IMPLANT
SUCTION FRAZIER TIP 10 FR DISP (SUCTIONS) IMPLANT
SUPPORT WRAP ARM LG (MISCELLANEOUS) IMPLANT
SUT ETHILON 4 0 PS 2 18 (SUTURE) IMPLANT
SUT FIBERWIRE #2 38 T-5 BLUE (SUTURE) ×2
SUT MNCRL AB 4-0 PS2 18 (SUTURE) ×2 IMPLANT
SUT PDS AB 1 CT  36 (SUTURE) ×1
SUT PDS AB 1 CT 36 (SUTURE) IMPLANT
SUT VIC AB 0 CT1 27 (SUTURE) ×2
SUT VIC AB 0 CT1 27XBRD ANBCTR (SUTURE) ×1 IMPLANT
SUT VIC AB 2-0 SH 27 (SUTURE) ×2
SUT VIC AB 2-0 SH 27XBRD (SUTURE) ×1 IMPLANT
SUTURE FIBERWR #2 38 T-5 BLUE (SUTURE) ×1 IMPLANT
SYR BULB 3OZ (MISCELLANEOUS) ×2 IMPLANT
TOWEL OR 17X24 6PK STRL BLUE (TOWEL DISPOSABLE) ×2 IMPLANT
TOWEL OR NON WOVEN STRL DISP B (DISPOSABLE) ×2 IMPLANT
TUBE CONNECTING 20X1/4 (TUBING) ×2 IMPLANT
YANKAUER SUCT BULB TIP NO VENT (SUCTIONS) ×2 IMPLANT
ZIPLOOP AC JOINT REPAIR (Orthopedic Implant) ×1 IMPLANT

## 2013-08-12 NOTE — Anesthesia Postprocedure Evaluation (Signed)
  Anesthesia Post-op Note  Patient: Carmen Garza  Procedure(s) Performed: Procedure(s) with comments: OPEN REDUCTION INTERNAL FIXATION (ORIF) CLAVICULAR FRACTURE (Left) - Open reduction internal fixation left distal clavical fracture  Patient Location: PACU  Anesthesia Type:GA combined with regional for post-op pain  Level of Consciousness: awake, alert  and oriented  Airway and Oxygen Therapy: Patient Spontanous Breathing and Patient connected to face mask oxygen  Post-op Pain: none  Post-op Assessment: Post-op Vital signs reviewed  Post-op Vital Signs: Reviewed  Last Vitals:  Filed Vitals:   08/12/13 1530  BP: 134/84  Pulse: 100  Temp: 36.7 C  Resp: 29    Complications: No apparent anesthesia complications

## 2013-08-12 NOTE — Anesthesia Preprocedure Evaluation (Addendum)
Anesthesia Evaluation  Patient identified by MRN, date of birth, ID band Patient awake    Reviewed: Allergy & Precautions, H&P , NPO status , Patient's Chart, lab work & pertinent test results  Airway Mallampati: I  TM Distance: >3 FB Neck ROM: Full    Dental  (+) Teeth Intact, Dental Advisory Given   Pulmonary former smoker,  breath sounds clear to auscultation        Cardiovascular Rhythm:Regular Rate:Normal     Neuro/Psych    GI/Hepatic   Endo/Other    Renal/GU      Musculoskeletal   Abdominal   Peds  Hematology   Anesthesia Other Findings   Reproductive/Obstetrics                             Anesthesia Physical Anesthesia Plan  ASA: I  Anesthesia Plan: General   Post-op Pain Management:    Induction: Intravenous  Airway Management Planned: Oral ETT  Additional Equipment:   Intra-op Plan:   Post-operative Plan: Extubation in OR  Informed Consent: I have reviewed the patients History and Physical, chart, labs and discussed the procedure including the risks, benefits and alternatives for the proposed anesthesia with the patient or authorized representative who has indicated his/her understanding and acceptance.   Dental advisory given  Plan Discussed with: CRNA, Anesthesiologist and Surgeon  Anesthesia Plan Comments:         Anesthesia Quick Evaluation  

## 2013-08-12 NOTE — Discharge Instructions (Signed)
Discharge Instructions after Open Shoulder Repair  A sling has been provided for you. Remain in your sling at all times. This includes sleeping in your sling.  Use ice on the shoulder intermittently over the first 48 hours after surgery.  Pain medicine has been prescribed for you.  Use your medicine liberally over the first 48 hours, and then you can begin to taper your use. You may take Extra Strength Tylenol or Tylenol only in place of the pain pills. DO NOT take ANY nonsteroidal anti-inflammatory pain medications: Advil, Motrin, Ibuprofen, Aleve, Naproxen or Naprosyn.  You may remove your dressing after two days  You may shower 5 days after surgery. The incisions CANNOT get wet prior to 5 days. Simply allow the water to wash over the site and then pat dry. Do not rub the incisions. Make sure your axilla (armpit) is completely dry after showering.  Take one aspirin, a day for 2 weeks after surgery, unless you have an aspirin sensitivity/ allergy or asthma.   Please call [phone] during normal business hours or [phone] after hours for any problems. Including the following:  - excessive redness of the incisions - drainage for more than 4 days - fever of more than 101.5 F  *Please note that pain medications will not be refilled after hours or on weekends.   Post Anesthesia Home Care Instructions  Activity: Get plenty of rest for the remainder of the day. A responsible adult should stay with you for 24 hours following the procedure.  For the next 24 hours, DO NOT: -Drive a car -Paediatric nurse -Drink alcoholic beverages -Take any medication unless instructed by your physician -Make any legal decisions or sign important papers.  Meals: Start with liquid foods such as gelatin or soup. Progress to regular foods as tolerated. Avoid greasy, spicy, heavy foods. If nausea and/or vomiting occur, drink only clear liquids until the nausea and/or vomiting subsides. Call your physician  if vomiting continues.  Special Instructions/Symptoms: Your throat may feel dry or sore from the anesthesia or the breathing tube placed in your throat during surgery. If this causes discomfort, gargle with warm salt water. The discomfort should disappear within 24 hours.   Regional Anesthesia Blocks  1. Numbness or the inability to move the "blocked" extremity may last from 3-48 hours after placement. The length of time depends on the medication injected and your individual response to the medication. If the numbness is not going away after 48 hours, call your surgeon.  2. The extremity that is blocked will need to be protected until the numbness is gone and the  Strength has returned. Because you cannot feel it, you will need to take extra care to avoid injury. Because it may be weak, you may have difficulty moving it or using it. You may not know what position it is in without looking at it while the block is in effect.  3. For blocks in the legs and feet, returning to weight bearing and walking needs to be done carefully. You will need to wait until the numbness is entirely gone and the strength has returned. You should be able to move your leg and foot normally before you try and bear weight or walk. You will need someone to be with you when you first try to ensure you do not fall and possibly risk injury.  4. Bruising and tenderness at the needle site are common side effects and will resolve in a few days.  5. Persistent numbness or new  problems with movement should be communicated to the surgeon or the [place] [phone] [place] [phone]).

## 2013-08-12 NOTE — Progress Notes (Signed)
Assisted Dr. Crews with left, ultrasound guided, interscalene  block. Side rails up, monitors on throughout procedure. See vital signs in flow sheet. Tolerated Procedure well. 

## 2013-08-12 NOTE — Anesthesia Procedure Notes (Addendum)
Anesthesia Regional Block:  Interscalene brachial plexus block  Pre-Anesthetic Checklist: ,, timeout performed, Correct Patient, Correct Site, Correct Laterality, Correct Procedure, Correct Position, site marked, Risks and benefits discussed,  Surgical consent,  Pre-op evaluation,  At surgeon's request and post-op pain management  Laterality: Left and Upper  Prep: chloraprep       Needles:  Injection technique: Single-shot  Needle Type: Echogenic Needle     Needle Length: 5cm 5 cm Needle Gauge: 21 and 21 G    Additional Needles:  Procedures: ultrasound guided (picture in chart) Interscalene brachial plexus block Narrative:  Start time: 08/12/2013 12:55 PM End time: 08/12/2013 1:01 PM Injection made incrementally with aspirations every 5 mL.  Performed by: Personally  Anesthesiologist: Lorrene Reid, MD   Procedure Name: Intubation Date/Time: 08/12/2013 1:43 PM Performed by: Lyndee Leo Pre-anesthesia Checklist: Patient identified, Emergency Drugs available, Suction available and Patient being monitored Patient Re-evaluated:Patient Re-evaluated prior to inductionOxygen Delivery Method: Circle System Utilized Preoxygenation: Pre-oxygenation with 100% oxygen Intubation Type: IV induction Ventilation: Mask ventilation without difficulty Laryngoscope Size: Mac and 3 Grade View: Grade II Tube type: Oral Number of attempts: 1 Airway Equipment and Method: stylet and oral airway Placement Confirmation: ETT inserted through vocal cords under direct vision,  positive ETCO2 and breath sounds checked- equal and bilateral Secured at: 21 cm Tube secured with: Tape Dental Injury: Teeth and Oropharynx as per pre-operative assessment

## 2013-08-12 NOTE — Op Note (Signed)
Procedure(s): OPEN REDUCTION INTERNAL FIXATION (ORIF) CLAVICULAR FRACTURE Procedure Note  Carmen Garza female 48 y.o. 08/12/2013  Procedure(s) and Anesthesia Type:    * OPEN REDUCTION INTERNAL FIXATION (ORIF) CLAVICULAR FRACTURE - General  Surgeon(s) and Role:    * Nita Sells, MD - Primary   Indications:  48 y.o. female s/p fall with left distal clavicle fracture. Indicated for surgery to improve anatomic alignment, decrease likelihood of nonunion.     Surgeon: Nita Sells   Assistants: Jeanmarie Hubert PA-C [facility] was present and scrubbed throughout the procedure and was essential in positioning, retraction, exposure, and closure)  Anesthesia: General endotracheal anesthesia with preoperative interscalene block       Procedure Detail  OPEN REDUCTION INTERNAL FIXATION (ORIF) CLAVICULAR FRACTURE  Findings: A Biomet AC zip loop device was used to reduce the coracoclavicular distance, given the complete disruption of the coracoclavicular ligaments. The distal fragment of the clavicle was extremely comminuted and was only 10-12 mm in length. It was felt that the best likelihood of a good result be to just resect this.  Estimated Blood Loss:  less than 50 mL         Drains: none  Blood Given: none         Specimens: none        Complications:  * No complications entered in OR log *         Disposition: PACU - hemodynamically stable.         Condition: stable    Procedure:  DESCRIPTION OF PROCEDURE: The patient was identified in preoperative  holding area where I personally marked the operative site after  verifying site, side, and procedure with the patient. The patient was taken back  to the operating room where general anesthesia was induced without  complication and was placed in the beach-chair position with the back  elevated about 40 degrees and all extremities carefully padded and  positioned. The neck was turned very slightly  away from the operative field  to assist in exposure. The left upper extremity was then prepped and  draped in a standard sterile fashion. The appropriate time-out  procedure was carried out. The patient did receive IV antibiotics  within 30 minutes of incision.  An incision was made in [employer] centered over the fracture site. Dissection was carried down through subcutaneous tissues and medial and lateral skin flaps were elevated.  The deltotrapezial fascia was then opened over the clavicle and the  medial and lateral fracture fragments were carefully exposed, taking great care to protect underlying neurovascular structures.  There were several small free fragments of bone which had no soft tissue attachments and were removed. The coracoclavicular ligaments were noted to be completely disrupted. Dorsal aspect of the coracoid was exposed carefully and small retractors were placed medial and laterally. The guidepin was placed through the center of the coracoid and verified with fluoroscopic imaging. This was overreamed with a 4.5 mm reamer. The Biomet a.c. zip  loop device was then passed superior to inferior and then flipped on the inferior aspect of the coracoid. This achieved excellent fixation was verified with fluoroscopy. The distal clavicle was then reduced and the guidepin was again used to create a 4.5 mm hole in the distal and the proximal segment of the clavicle. The device was then passed and tightened with excellent fixation. Fluoroscopy was used to verify anatomic reduction. The remaining fragment of distal clavicle was quite small measuring only about 10-12 mm. Was  felt that the patient likely be best with simple resection of this fragment rather than attempt at healing this. Therefore this was resected with a rongeur. Final fluoroscopic imaging showed adequate reduction of the coracoclavicular distance. The wound was copiously irrigated with normal saline and subsequently closed in  layers with #1 PDS in a deep fascial layer, 2-0 Vicryl in a deep dermal layer and 4-0 Monocryl for skin closure. Steri-Strips were applied. A light sterile dressing was then applied. The patient was allowed to awaken from anesthesia, transferred to stretcher and taken to the recovery room in stable condition.  POSTOPERATIVE PLAN: She will likely be discharged home today with her husband as long as her pain is well-controlled in the recovery room. She will followup with me in 2 weeks. She will remain in her sling until followup.

## 2013-08-12 NOTE — Transfer of Care (Signed)
Immediate Anesthesia Transfer of Care Note  Patient: Carmen Garza  Procedure(s) Performed: Procedure(s) with comments: OPEN REDUCTION INTERNAL FIXATION (ORIF) CLAVICULAR FRACTURE (Left) - Open reduction internal fixation left distal clavical fracture  Patient Location: PACU  Anesthesia Type:GA combined with regional for post-op pain  Level of Consciousness: awake, sedated and patient cooperative  Airway & Oxygen Therapy: Patient Spontanous Breathing and Patient connected to face mask oxygen  Post-op Assessment: Report given to PACU RN and Post -op Vital signs reviewed and stable  Post vital signs: Reviewed and stable  Complications: No apparent anesthesia complications

## 2013-08-12 NOTE — H&P (Signed)
Carmen Garza is an 48 y.o. female.   Chief Complaint: L shoulder pain  HPI: L distal clavicle fracture after fall.  Past Medical History  Diagnosis Date  . Obesity   . Elevated cholesterol     Borderline  . 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 Wears glasses     Past Surgical History  Procedure Laterality Date  . Abdominal hysterectomy  06/2009    Exp lap, TAH/BSO, staging  . Tonsillectomy  age 41  . Cholecystectomy  06/2008    Lap    Family History  Problem Relation Age of Onset  . Colon cancer Mother   . Lung cancer Father   . Hypertension Sister   . Hypertension Brother   . Diabetes Maternal Grandmother    Social History:  reports that she quit smoking about 10 years ago. She does not have any smokeless tobacco history on file. She reports that she drinks alcohol. She reports that she does not use illicit drugs.  Allergies: No Known Allergies  Medications Prior to Admission  Medication Sig Dispense Refill  . loratadine (CLARITIN) 10 MG tablet Take 10 mg by mouth daily.        Marland Kitchen oxyCODONE-acetaminophen (PERCOCET/ROXICET) 5-325 MG per tablet Take 1 tablet by mouth every 6 (six) hours as needed for moderate pain or severe pain.  15 tablet  0  . venlafaxine (EFFEXOR-XR) 37.5 MG 24 hr capsule Take 37.5 mg by mouth daily.      . pseudoephedrine (SUDAFED) 30 MG tablet Take 30 mg by mouth every 4 (four) hours as needed.          No results found for this or any previous visit (from the past 48 hour(s)). No results found.  Review of Systems  All other systems reviewed and are negative.   Blood pressure 116/77, pulse 92, temperature 98.2 F (36.8 C), temperature source Oral, resp. rate 20, height 5\' 2"  (1.575 m), weight 79.833 kg (176 lb), SpO2 96.00%. Physical Exam  Constitutional: She is oriented to person, place, and time. She appears well-developed and well-nourished.  HENT:  Head: Atraumatic.  Eyes: EOM are normal.  Cardiovascular: Intact distal  pulses.   Respiratory: Effort normal.  Musculoskeletal:  L shoulder swollen TTP at distal clavicle.  Neurological: She is alert and oriented to person, place, and time.  Skin: Skin is warm and dry.  Psychiatric: She has a normal mood and affect.     Assessment/Plan L displaced distal clavicle fracture Plan ORIF with CC ligament reconstruction. Risks / benefits of surgery discussed Consent on chart  NPO for OR Preop antibiotics   Nita Sells 08/12/2013, 12:36 PM

## 2013-08-13 ENCOUNTER — Encounter (HOSPITAL_BASED_OUTPATIENT_CLINIC_OR_DEPARTMENT_OTHER): Payer: Self-pay | Admitting: Orthopedic Surgery

## 2013-09-25 ENCOUNTER — Ambulatory Visit: Payer: BC Managed Care – PPO | Admitting: Gynecologic Oncology

## 2013-11-13 ENCOUNTER — Ambulatory Visit: Payer: BC Managed Care – PPO | Attending: Gynecologic Oncology | Admitting: Gynecologic Oncology

## 2013-11-13 ENCOUNTER — Ambulatory Visit: Payer: BC Managed Care – PPO

## 2013-11-13 ENCOUNTER — Encounter: Payer: Self-pay | Admitting: Gynecologic Oncology

## 2013-11-13 VITALS — BP 111/53 | HR 90 | Temp 98.3°F | Resp 16 | Ht 62.0 in | Wt 182.9 lb

## 2013-11-13 DIAGNOSIS — C569 Malignant neoplasm of unspecified ovary: Secondary | ICD-10-CM | POA: Insufficient documentation

## 2013-11-13 DIAGNOSIS — E7800 Pure hypercholesterolemia, unspecified: Secondary | ICD-10-CM | POA: Insufficient documentation

## 2013-11-13 DIAGNOSIS — R971 Elevated cancer antigen 125 [CA 125]: Secondary | ICD-10-CM | POA: Insufficient documentation

## 2013-11-13 NOTE — Patient Instructions (Signed)
We will call you with your lab results. Follow up in 6 months

## 2013-11-13 NOTE — Progress Notes (Signed)
Consult Note: Gyn-Onc  Carmen Garza 48 y.o. female  CC:  Chief Complaint  Patient presents with  . Ovarian Cancer    Follow up     HPI: Carmen Garza is a very pleasant 48 year old who in February 2011 was diagnosed with bilateral complex adnexal masses and elevated CA-125 of 145. She underwent exploratory laparotomy, TAHBSO, and appropriate staging in March 2011. Operative findings included a 13-cm ovarian mass. Pathology was consistent with a grade 1 endometrioid adenocarcinoma arising within an atypical proliferation of an endometrioid tumor and endometriosis, final stage I C. She completed treatment with six cycles of paclitaxel and carboplatin with her last cycle of chemotherapy in May-July 2011. Posttreatment CT scan was negative. Her CA-125 is completely normalized.   Interval History:  Her weight is down back up slightly to 182lbs. This is because she is recovering from a hip injury, fall and clavicular surgery in the past year. She's making healthcare choices. She is continuing to use Effexor to palliate symptoms of vasomotor spasms/hot flashes from menopause. She stopped taking it for a few days and felt very ill and has returned to taking it and feels well again. She is due for her mammogram next month. Her last colonoscopy was 2 years ago and per patient was normal. There's no new medical problems in the family.   Review of Systems  She denies symptoms concerning for recurrence including: vaginal bleeding, pelvic pain or pressure, abdominal pain, change in bladder or bowel habit, new lower extremity edema, cough, early satiety, nausea or emesis). Constitutional: Denies fever. Skin: No rash, sores, jaundice, itching, or dryness.  Cardiovascular: No chest pain, shortness of breath, or edema  Pulmonary: No cough or wheeze.  Gastro Intestinal: No nausea, vomiting, constipation, or diarrhea reported. No bright red blood per rectum or change in bowel movement.  Genitourinary: No  frequency, urgency, or dysuria.  Denies vaginal bleeding and discharge. She reports vaginal dryness after surgery, which she manages with vaginal lubricants. Musculoskeletal: No myalgia, arthralgia, joint swelling or pain.  Neurologic: No weakness, numbness, or change in gait.  Psychology: no changes    Current Meds:  Outpatient Encounter Prescriptions as of 11/13/2013  Medication Sig  . loratadine (CLARITIN) 10 MG tablet Take 10 mg by mouth daily.    . pseudoephedrine (SUDAFED) 30 MG tablet Take 30 mg by mouth every 4 (four) hours as needed.    . venlafaxine (EFFEXOR-XR) 37.5 MG 24 hr capsule Take 37.5 mg by mouth daily.  . [DISCONTINUED] docusate sodium (COLACE) 100 MG capsule Take 1 capsule (100 mg total) by mouth 3 (three) times daily as needed.  . [DISCONTINUED] oxyCODONE-acetaminophen (PERCOCET/ROXICET) 5-325 MG per tablet Take 1-2 tablets by mouth every 4 (four) hours as needed for moderate pain or severe pain.    Allergy: No Known Allergies  Social Hx:   History   Social History  . Marital Status: Married    Spouse Name: N/A    Number of Children: N/A  . Years of Education: N/A   Occupational History  . Not on file.   Social History Main Topics  . Smoking status: Former Smoker -- 0.50 packs/day for 12 years    Quit date: 08/09/2003  . Smokeless tobacco: Not on file  . Alcohol Use: Yes  . Drug Use: No  . Sexual Activity: Yes   Other Topics Concern  . Not on file   Social History Narrative  . No narrative on file    Past Surgical Hx:  Past Surgical  History  Procedure Laterality Date  . Abdominal hysterectomy  06/2009    Exp lap, TAH/BSO, staging  . Tonsillectomy  age 48  . Cholecystectomy  06/2008    Lap  . Orif clavicular fracture Left 08/12/2013    Procedure: OPEN REDUCTION INTERNAL FIXATION (ORIF) CLAVICULAR FRACTURE;  Surgeon: Nita Sells, MD;  Location: [place];  Service: Orthopedics;  Laterality: Left;  Open reduction internal  fixation left distal clavical fracture    Past Medical Hx:  Past Medical History  Diagnosis Date  . Obesity   . Elevated cholesterol     Borderline  . 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 Wears glasses     Oncology Hx:    Ovarian cancer   05/29/2009 Initial Diagnosis Ovarian cancer   06/28/2009 Surgery IC ovarian cancer    - 10/28/2009 Chemotherapy 6 cycles of paclitaxel and carboplatin    Family Hx:  Family History  Problem Relation Age of Onset  . Colon cancer Mother   . Lung cancer Father   . Hypertension Sister   . Hypertension Brother   . Diabetes Maternal Grandmother     Vitals:  Blood pressure 111/53, pulse 90, temperature 98.3 F (36.8 C), temperature source Oral, resp. rate 16, height 5\' 2"  (1.575 m), weight 182 lb 14.4 oz (82.963 kg).  Physical Exam:  GENERAL: Well-nourished and well-developed female, in no acute distress.   NECK: Supple. There is no lymphadenopathy, no thyromegaly.   LUNGS: Clear to auscultation bilaterally.   CARDIOVASCULAR: Regular rate and rhythm.   ABDOMEN: Shows well-healed vertical midline incision. Abdomen is soft, nontender, and nondistended. There are no palpable masses or hepatosplenomegaly. There is no incisional hernia. Groins are negative for adenopathy.   EXTREMITIES: There is no edema  .  PELVIC: External genitalia is within normal limits. Bimanual examination reveals no masses or nodularity. Rectal confirms.   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 She completed her last cycle of paclitaxel and carboplatin in July 2011, had a negative post- treatment CT scan. She clinically has no evidence of recurrent disease with a negative exam and negative cancer antigen-125.   PLAN:  We will followup in results of her CA 125 from today. Return to see Dr Alycia Rossetti or myself in 6 months.  Carmen Eva, MD 11/13/2013, 10:59 AM

## 2013-11-14 ENCOUNTER — Telehealth: Payer: Self-pay | Admitting: *Deleted

## 2013-11-14 LAB — CA 125: CA 125: 9.4 U/mL (ref 0.0–30.2)

## 2013-11-14 NOTE — Telephone Encounter (Signed)
Message copied by Lucile Crater on Fri Nov 14, 2013  4:52 PM ------      Message from: Everitt Amber C      Created: Fri Nov 14, 2013  9:19 AM       Normal CA 125. No change in plan for routine followup. Patient is NED for cancer.      Zamora Colton, would you mind calling her with the results.      Thank you.      Terrence Dupont ------

## 2013-11-14 NOTE — Telephone Encounter (Signed)
Notified pt of CA-125 results. lmovm to call back to confirm message received. F/u in 6 months

## 2013-11-20 ENCOUNTER — Other Ambulatory Visit: Payer: Self-pay

## 2013-11-20 DIAGNOSIS — Z1231 Encounter for screening mammogram for malignant neoplasm of breast: Secondary | ICD-10-CM

## 2013-11-24 ENCOUNTER — Ambulatory Visit
Admission: RE | Admit: 2013-11-24 | Discharge: 2013-11-24 | Disposition: A | Discharge status: Home / Self Care | Payer: BC Managed Care – PPO | Source: Ambulatory Visit

## 2013-11-24 DIAGNOSIS — Z1231 Encounter for screening mammogram for malignant neoplasm of breast: Secondary | ICD-10-CM

## 2014-06-23 ENCOUNTER — Other Ambulatory Visit: Payer: Self-pay | Admitting: Gynecologic Oncology

## 2014-07-24 ENCOUNTER — Other Ambulatory Visit: Payer: Self-pay | Admitting: *Deleted

## 2014-07-24 MED ORDER — VENLAFAXINE HCL ER 37.5 MG PO CP24: 37.5000 mg | ORAL_CAPSULE | Freq: Every day | ORAL | Status: DC

## 2014-07-24 NOTE — Telephone Encounter (Signed)
Pt called request for refill on Effexor, missed 84mth f/u with MD, pt requesting to seee Dr. Alycia Rossetti, appt made for 4/28. Refill given x 1 month discussed with pt additional refills to be discussed with provider. Pt states " I've been taking 1 every other day, I quit taking them a while back it it was just awful."

## 2014-08-06 ENCOUNTER — Ambulatory Visit: Payer: BLUE CROSS/BLUE SHIELD | Attending: Gynecologic Oncology | Admitting: Gynecologic Oncology

## 2014-08-06 ENCOUNTER — Encounter: Payer: Self-pay | Admitting: Gynecologic Oncology

## 2014-08-06 ENCOUNTER — Other Ambulatory Visit (HOSPITAL_BASED_OUTPATIENT_CLINIC_OR_DEPARTMENT_OTHER): Payer: BLUE CROSS/BLUE SHIELD

## 2014-08-06 VITALS — BP 116/84 | HR 78 | Temp 97.9°F | Resp 16 | Ht 62.0 in | Wt 187.1 lb

## 2014-08-06 DIAGNOSIS — C569 Malignant neoplasm of unspecified ovary: Secondary | ICD-10-CM

## 2014-08-06 DIAGNOSIS — Z8543 Personal history of malignant neoplasm of ovary: Secondary | ICD-10-CM | POA: Diagnosis not present

## 2014-08-06 NOTE — Progress Notes (Signed)
Consult Note: Gyn-Onc  Carmen Garza 49 y.o. female  CC:  Chief Complaint  Patient presents with  . Ovarian Cancer    HPI: Carmen Garza is a very pleasant 49 year old who in February 2011 was diagnosed with bilateral complex adnexal masses and elevated CA-125 of 145. She underwent exploratory laparotomy, TAHBSO, and appropriate staging in March 2011. Operative findings included a 13-cm ovarian mass. Pathology was consistent with a grade 1 endometrioid adenocarcinoma arising within an atypical proliferation of an endometrioid tumor and endometriosis, final stage I C. She completed treatment with six cycles of paclitaxel and carboplatin with her last cycle of chemotherapy in May-July 2011. Posttreatment CT scan was negative. Her CA-125 is completely normalized. I last saw her last year and she was seen by Dr. Denman George in August 2015. Her CO2 5 in August 2015 was 9.4. This is about the 5 year anniversary.  Interval History:  Her weight is up slightly to 187lbs.  She had a colonoscopy about 2.5  years ago (8/13) which was unremarkable. There are no new medical problems and her family. With regards to her mammogram, her last one was 8/15 and was unremarkable.  Review of Systems  She denies symptoms concerning for recurrence including: vaginal bleeding, pelvic pain or pressure, abdominal pain, change in bladder or bowel habit, new lower extremity edema, cough, early satiety, nausea or emesis. Constitutional: Denies fever. Skin: + dry skin on hands Cardiovascular: No chest pain, shortness of breath, or edema  Pulmonary: No cough  Gastro Intestinal: No nausea, vomiting, constipation, or diarrhea reported. No change in bowel movement.  Genitourinary: No frequency, urgency, or dysuria.  Denies vaginal bleeding and discharge.  Musculoskeletal: No joint pain.  Neurologic: No weakness, numbness Psychology: No changes    Current Meds:  Outpatient Encounter Prescriptions as of 08/06/2014  Medication Sig   . loratadine (CLARITIN) 10 MG tablet Take 10 mg by mouth daily.    . pseudoephedrine (SUDAFED) 30 MG tablet Take 30 mg by mouth every 4 (four) hours as needed.    . venlafaxine XR (EFFEXOR-XR) 37.5 MG 24 hr capsule Take 1 capsule (37.5 mg total) by mouth daily.    Allergy: No Known Allergies  Social Hx:   History   Social History  . Marital Status: Married    Spouse Name: N/A  . Number of Children: N/A  . Years of Education: N/A   Occupational History  . Not on file.   Social History Main Topics  . Smoking status: Former Smoker -- 0.50 packs/day for 12 years    Quit date: 08/09/2003  . Smokeless tobacco: Not on file  . Alcohol Use: Yes  . Drug Use: No  . Sexual Activity: Yes   Other Topics Concern  . Not on file   Social History Narrative    Past Surgical Hx:  Past Surgical History  Procedure Laterality Date  . Abdominal hysterectomy  06/2009    Exp lap, TAH/BSO, staging  . Tonsillectomy  age 31  . Cholecystectomy  06/2008    Lap  . Orif clavicular fracture Left 08/12/2013    Procedure: OPEN REDUCTION INTERNAL FIXATION (ORIF) CLAVICULAR FRACTURE;  Surgeon: Nita Sells, MD;  Location: [place];  Service: Orthopedics;  Laterality: Left;  Open reduction internal fixation left distal clavical fracture    Past Medical Hx:  Past Medical History  Diagnosis Date  . Obesity   . Elevated cholesterol     Borderline  . Ovarian cancer 03/29/2011    ovarian  carcinoma IC  . Wears glasses     Oncology Hx:    Ovarian cancer   05/29/2009 Initial Diagnosis Ovarian cancer   06/28/2009 Surgery IC ovarian cancer    - 10/28/2009 Chemotherapy 6 cycles of paclitaxel and carboplatin    Family Hx:  Family History  Problem Relation Age of Onset  . Colon cancer Mother   . Lung cancer Father   . Hypertension Sister   . Hypertension Brother   . Diabetes Maternal Grandmother     Vitals:  Blood pressure 116/84, pulse 78, temperature 97.9 F (36.6 C),  temperature source Oral, resp. rate 16, height 5\' 2"  (1.575 m), weight 187 lb 1.6 oz (84.868 kg).  Physical Exam:  GENERAL: Well-nourished and well-developed female, in no acute distress.   NECK: Supple. There is no lymphadenopathy, no thyromegaly. Well healed incision left collar bone.  LUNGS: Clear to auscultation bilaterally.   CARDIOVASCULAR: Regular rate and rhythm.   ABDOMEN: Shows well-healed vertical midline incision. Abdomen is soft, nontender, and nondistended. There are no palpable masses or hepatosplenomegaly. There is no incisional hernia. Groins are negative for adenopathy.   EXTREMITIES: There is no edema  .  PELVIC: External genitalia is within normal limits. Bimanual examination reveals no masses or nodularity. Rectal confirms.   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 She completed her last cycle of paclitaxel and carboplatin in July 2011, had a negative post- treatment CT scan. She clinically has no evidence of recurrent disease with a negative exam and negative cancer antigen-125. Today's values are pending  PLAN:  We will followup in results of her CA 125 from today. This is her 5 year anniversary. She will follow-up with Dr. Mancel Bale for her routine GYN care. She knows however, that we'll be happy to see her in the future should the need arise.  Anthonee Gelin A., MD 08/06/2014, 10:11 AM

## 2014-08-06 NOTE — Patient Instructions (Signed)
We will follow-up in results of her CA-125 from today. Please return to see Dr. Mancel Bale for routine GYN care. Congratulations on your 5 year survivorship!

## 2014-08-07 LAB — CA 125: CA 125: 13 U/mL (ref <35)

## 2014-08-24 ENCOUNTER — Other Ambulatory Visit: Payer: Self-pay | Admitting: *Deleted

## 2014-08-24 DIAGNOSIS — R232 Flushing: Secondary | ICD-10-CM

## 2014-08-24 DIAGNOSIS — C569 Malignant neoplasm of unspecified ovary: Secondary | ICD-10-CM

## 2014-08-24 MED ORDER — VENLAFAXINE HCL ER 37.5 MG PO CP24: 37.5000 mg | ORAL_CAPSULE | Freq: Every day | ORAL | Status: AC

## 2014-08-24 NOTE — Telephone Encounter (Signed)
Received call from patient requesting refill on Effexor. Patient states she does not followup with her PCP until the end of the year. Per Joylene John, NP medication with refills sent to patient's pharmacy - patient notified that her PCP or gynecologist will need to be filling this medication going forward since she has been released from Dr. Elenora Gamma care. Patient agreeable to this and states she will go ahead and schedule a followup with her PCP.

## 2014-12-10 ENCOUNTER — Other Ambulatory Visit: Payer: Self-pay

## 2014-12-10 DIAGNOSIS — Z1231 Encounter for screening mammogram for malignant neoplasm of breast: Secondary | ICD-10-CM

## 2014-12-21 ENCOUNTER — Ambulatory Visit
Admission: RE | Admit: 2014-12-21 | Discharge: 2014-12-21 | Disposition: A | Discharge status: Home / Self Care | Payer: BLUE CROSS/BLUE SHIELD | Source: Ambulatory Visit

## 2014-12-21 DIAGNOSIS — Z1231 Encounter for screening mammogram for malignant neoplasm of breast: Secondary | ICD-10-CM

## 2015-07-17 DIAGNOSIS — J069 Acute upper respiratory infection, unspecified: Secondary | ICD-10-CM | POA: Diagnosis not present

## 2015-12-08 ENCOUNTER — Other Ambulatory Visit: Payer: Self-pay | Admitting: Family Medicine

## 2015-12-08 DIAGNOSIS — Z1231 Encounter for screening mammogram for malignant neoplasm of breast: Secondary | ICD-10-CM

## 2015-12-30 ENCOUNTER — Ambulatory Visit
Admission: RE | Admit: 2015-12-30 | Discharge: 2015-12-30 | Disposition: A | Discharge status: Home / Self Care | Payer: BLUE CROSS/BLUE SHIELD | Source: Ambulatory Visit | Attending: Family Medicine | Admitting: Family Medicine

## 2015-12-30 DIAGNOSIS — Z1231 Encounter for screening mammogram for malignant neoplasm of breast: Secondary | ICD-10-CM

## 2016-02-17 DIAGNOSIS — J0101 Acute recurrent maxillary sinusitis: Secondary | ICD-10-CM | POA: Diagnosis not present

## 2016-03-06 DIAGNOSIS — Z0000 Encounter for general adult medical examination without abnormal findings: Secondary | ICD-10-CM | POA: Diagnosis not present

## 2016-03-06 DIAGNOSIS — Z8543 Personal history of malignant neoplasm of ovary: Secondary | ICD-10-CM | POA: Diagnosis not present

## 2016-03-06 DIAGNOSIS — E7800 Pure hypercholesterolemia, unspecified: Secondary | ICD-10-CM | POA: Diagnosis not present

## 2017-02-23 ENCOUNTER — Other Ambulatory Visit: Payer: Self-pay | Admitting: Family Medicine

## 2017-02-23 DIAGNOSIS — Z1231 Encounter for screening mammogram for malignant neoplasm of breast: Secondary | ICD-10-CM

## 2017-03-07 DIAGNOSIS — E7800 Pure hypercholesterolemia, unspecified: Secondary | ICD-10-CM | POA: Diagnosis not present

## 2017-03-07 DIAGNOSIS — Z8543 Personal history of malignant neoplasm of ovary: Secondary | ICD-10-CM | POA: Diagnosis not present

## 2017-03-07 DIAGNOSIS — Z0000 Encounter for general adult medical examination without abnormal findings: Secondary | ICD-10-CM | POA: Diagnosis not present

## 2017-03-21 DIAGNOSIS — K5730 Diverticulosis of large intestine without perforation or abscess without bleeding: Secondary | ICD-10-CM | POA: Diagnosis not present

## 2017-03-21 DIAGNOSIS — Z800 Family history of malignant neoplasm of digestive organs: Secondary | ICD-10-CM | POA: Diagnosis not present

## 2017-03-21 DIAGNOSIS — D126 Benign neoplasm of colon, unspecified: Secondary | ICD-10-CM | POA: Diagnosis not present

## 2017-03-21 DIAGNOSIS — Z86010 Personal history of colonic polyps: Secondary | ICD-10-CM | POA: Diagnosis not present

## 2017-03-26 ENCOUNTER — Ambulatory Visit
Admission: RE | Admit: 2017-03-26 | Discharge: 2017-03-26 | Disposition: A | Discharge status: Home / Self Care | Payer: BLUE CROSS/BLUE SHIELD | Source: Ambulatory Visit | Attending: Family Medicine | Admitting: Family Medicine

## 2017-03-26 DIAGNOSIS — Z1231 Encounter for screening mammogram for malignant neoplasm of breast: Secondary | ICD-10-CM

## 2017-03-27 DIAGNOSIS — L309 Dermatitis, unspecified: Secondary | ICD-10-CM | POA: Diagnosis not present

## 2017-03-27 DIAGNOSIS — M67441 Ganglion, right hand: Secondary | ICD-10-CM | POA: Diagnosis not present

## 2017-03-27 DIAGNOSIS — D126 Benign neoplasm of colon, unspecified: Secondary | ICD-10-CM | POA: Diagnosis not present

## 2018-02-06 DIAGNOSIS — M94262 Chondromalacia, left knee: Secondary | ICD-10-CM | POA: Diagnosis not present

## 2018-03-05 DIAGNOSIS — M25562 Pain in left knee: Secondary | ICD-10-CM | POA: Diagnosis not present

## 2018-03-11 DIAGNOSIS — M25562 Pain in left knee: Secondary | ICD-10-CM | POA: Diagnosis not present

## 2018-03-12 DIAGNOSIS — Z8543 Personal history of malignant neoplasm of ovary: Secondary | ICD-10-CM | POA: Diagnosis not present

## 2018-03-12 DIAGNOSIS — N959 Unspecified menopausal and perimenopausal disorder: Secondary | ICD-10-CM | POA: Diagnosis not present

## 2018-03-12 DIAGNOSIS — E7800 Pure hypercholesterolemia, unspecified: Secondary | ICD-10-CM | POA: Diagnosis not present

## 2018-03-12 DIAGNOSIS — Z0000 Encounter for general adult medical examination without abnormal findings: Secondary | ICD-10-CM | POA: Diagnosis not present

## 2018-03-13 ENCOUNTER — Other Ambulatory Visit: Payer: Self-pay | Admitting: Family Medicine

## 2018-03-13 DIAGNOSIS — Z1231 Encounter for screening mammogram for malignant neoplasm of breast: Secondary | ICD-10-CM

## 2018-03-13 DIAGNOSIS — E2839 Other primary ovarian failure: Secondary | ICD-10-CM

## 2018-03-18 DIAGNOSIS — M25562 Pain in left knee: Secondary | ICD-10-CM | POA: Diagnosis not present

## 2018-03-19 DIAGNOSIS — M25562 Pain in left knee: Secondary | ICD-10-CM | POA: Diagnosis not present

## 2018-04-19 ENCOUNTER — Other Ambulatory Visit: Payer: BLUE CROSS/BLUE SHIELD

## 2018-04-19 ENCOUNTER — Ambulatory Visit: Payer: BLUE CROSS/BLUE SHIELD

## 2018-04-19 DIAGNOSIS — M2242 Chondromalacia patellae, left knee: Secondary | ICD-10-CM | POA: Diagnosis not present

## 2018-04-19 DIAGNOSIS — G8918 Other acute postprocedural pain: Secondary | ICD-10-CM | POA: Diagnosis not present

## 2018-04-24 DIAGNOSIS — Z9889 Other specified postprocedural states: Secondary | ICD-10-CM | POA: Diagnosis not present

## 2018-04-25 DIAGNOSIS — Z4789 Encounter for other orthopedic aftercare: Secondary | ICD-10-CM | POA: Diagnosis not present

## 2018-04-25 DIAGNOSIS — M25562 Pain in left knee: Secondary | ICD-10-CM | POA: Diagnosis not present

## 2018-05-01 DIAGNOSIS — M25562 Pain in left knee: Secondary | ICD-10-CM | POA: Diagnosis not present

## 2018-05-01 DIAGNOSIS — Z4789 Encounter for other orthopedic aftercare: Secondary | ICD-10-CM | POA: Diagnosis not present

## 2018-05-06 DIAGNOSIS — M25562 Pain in left knee: Secondary | ICD-10-CM | POA: Diagnosis not present

## 2018-05-06 DIAGNOSIS — Z4789 Encounter for other orthopedic aftercare: Secondary | ICD-10-CM | POA: Diagnosis not present

## 2018-05-08 DIAGNOSIS — Z4789 Encounter for other orthopedic aftercare: Secondary | ICD-10-CM | POA: Diagnosis not present

## 2018-05-08 DIAGNOSIS — M25562 Pain in left knee: Secondary | ICD-10-CM | POA: Diagnosis not present

## 2018-05-15 DIAGNOSIS — Z4789 Encounter for other orthopedic aftercare: Secondary | ICD-10-CM | POA: Diagnosis not present

## 2018-05-15 DIAGNOSIS — M25562 Pain in left knee: Secondary | ICD-10-CM | POA: Diagnosis not present

## 2018-05-16 ENCOUNTER — Ambulatory Visit
Admission: RE | Admit: 2018-05-16 | Discharge: 2018-05-16 | Disposition: A | Discharge status: Home / Self Care | Payer: Self-pay | Source: Ambulatory Visit | Attending: Family Medicine | Admitting: Family Medicine

## 2018-05-16 ENCOUNTER — Ambulatory Visit
Admission: RE | Admit: 2018-05-16 | Discharge: 2018-05-16 | Disposition: A | Discharge status: Home / Self Care | Payer: BLUE CROSS/BLUE SHIELD | Source: Ambulatory Visit | Attending: Family Medicine | Admitting: Family Medicine

## 2018-05-16 DIAGNOSIS — E2839 Other primary ovarian failure: Secondary | ICD-10-CM

## 2018-05-16 DIAGNOSIS — Z780 Asymptomatic menopausal state: Secondary | ICD-10-CM | POA: Diagnosis not present

## 2018-05-16 DIAGNOSIS — M8588 Other specified disorders of bone density and structure, other site: Secondary | ICD-10-CM | POA: Diagnosis not present

## 2018-05-16 DIAGNOSIS — Z1231 Encounter for screening mammogram for malignant neoplasm of breast: Secondary | ICD-10-CM | POA: Diagnosis not present

## 2018-05-16 DIAGNOSIS — M810 Age-related osteoporosis without current pathological fracture: Secondary | ICD-10-CM | POA: Diagnosis not present

## 2018-05-28 DIAGNOSIS — Z4789 Encounter for other orthopedic aftercare: Secondary | ICD-10-CM | POA: Diagnosis not present

## 2018-05-28 DIAGNOSIS — M25562 Pain in left knee: Secondary | ICD-10-CM | POA: Diagnosis not present

## 2019-03-19 DIAGNOSIS — Z0000 Encounter for general adult medical examination without abnormal findings: Secondary | ICD-10-CM | POA: Diagnosis not present

## 2019-03-25 DIAGNOSIS — Z8543 Personal history of malignant neoplasm of ovary: Secondary | ICD-10-CM | POA: Diagnosis not present

## 2019-03-25 DIAGNOSIS — E7800 Pure hypercholesterolemia, unspecified: Secondary | ICD-10-CM | POA: Diagnosis not present

## 2019-03-25 DIAGNOSIS — Z23 Encounter for immunization: Secondary | ICD-10-CM | POA: Diagnosis not present

## 2019-05-27 ENCOUNTER — Other Ambulatory Visit: Payer: Self-pay | Admitting: Family Medicine

## 2019-05-27 DIAGNOSIS — Z1231 Encounter for screening mammogram for malignant neoplasm of breast: Secondary | ICD-10-CM

## 2019-07-02 ENCOUNTER — Other Ambulatory Visit: Payer: Self-pay

## 2019-07-02 ENCOUNTER — Ambulatory Visit
Admission: RE | Admit: 2019-07-02 | Discharge: 2019-07-02 | Disposition: A | Discharge status: Home / Self Care | Payer: BC Managed Care – PPO | Source: Ambulatory Visit | Attending: Family Medicine | Admitting: Family Medicine

## 2019-07-02 DIAGNOSIS — Z1231 Encounter for screening mammogram for malignant neoplasm of breast: Secondary | ICD-10-CM | POA: Diagnosis not present

## 2019-09-23 DIAGNOSIS — R300 Dysuria: Secondary | ICD-10-CM | POA: Diagnosis not present

## 2019-10-23 IMAGING — MG DIGITAL SCREENING BILATERAL MAMMOGRAM WITH CAD
4 series · 4 of 4 positions shown · non-contrast
Comparison: Previous exam(s).

CLINICAL DATA: Screening.

EXAM:
DIGITAL SCREENING BILATERAL MAMMOGRAM WITH CAD

[L CC]
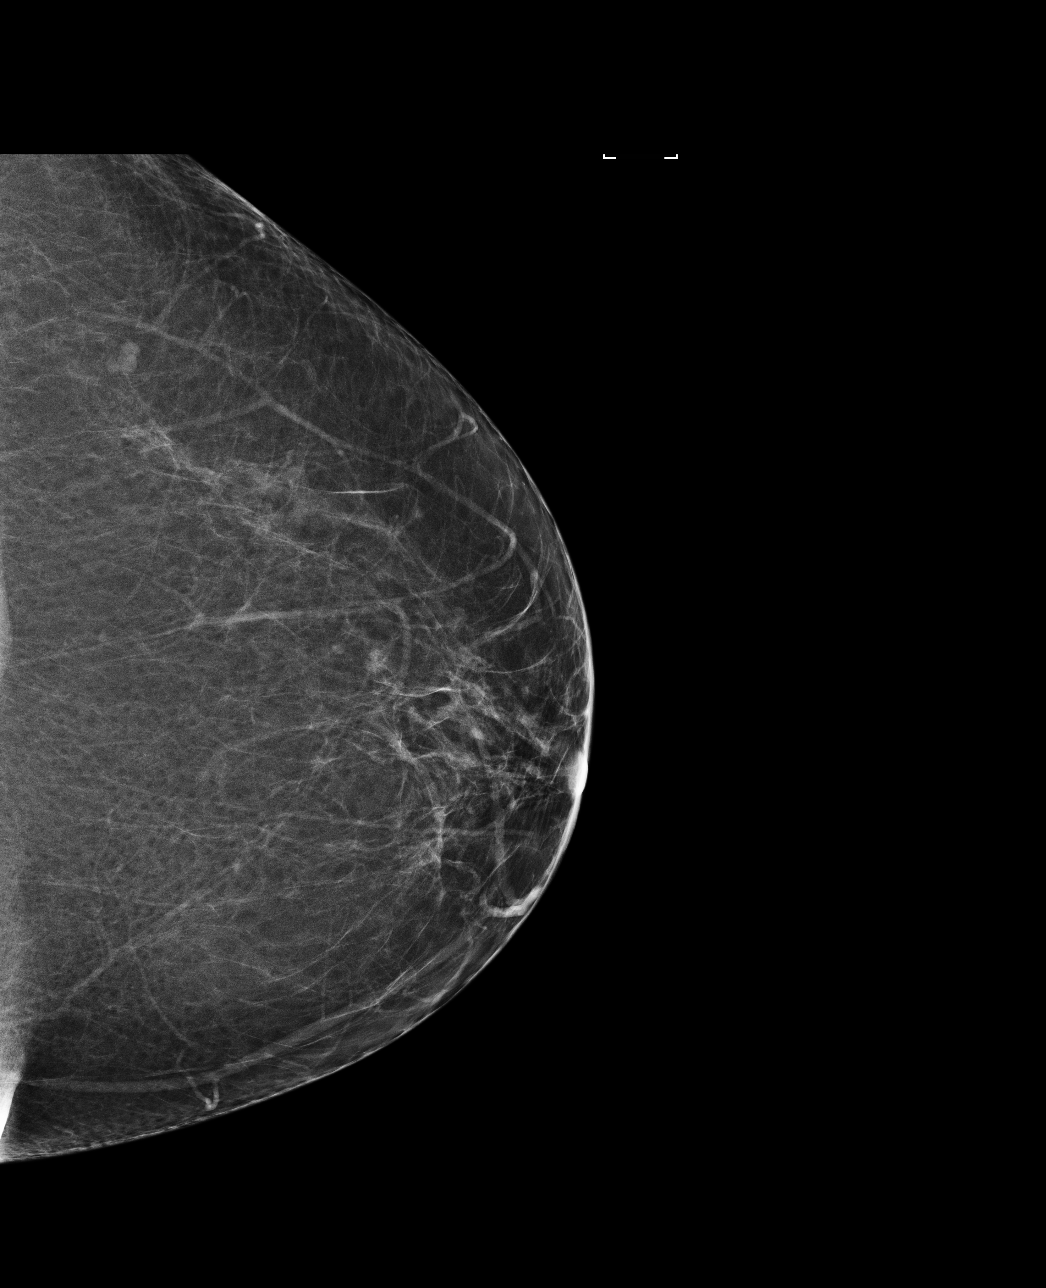

[R CC]
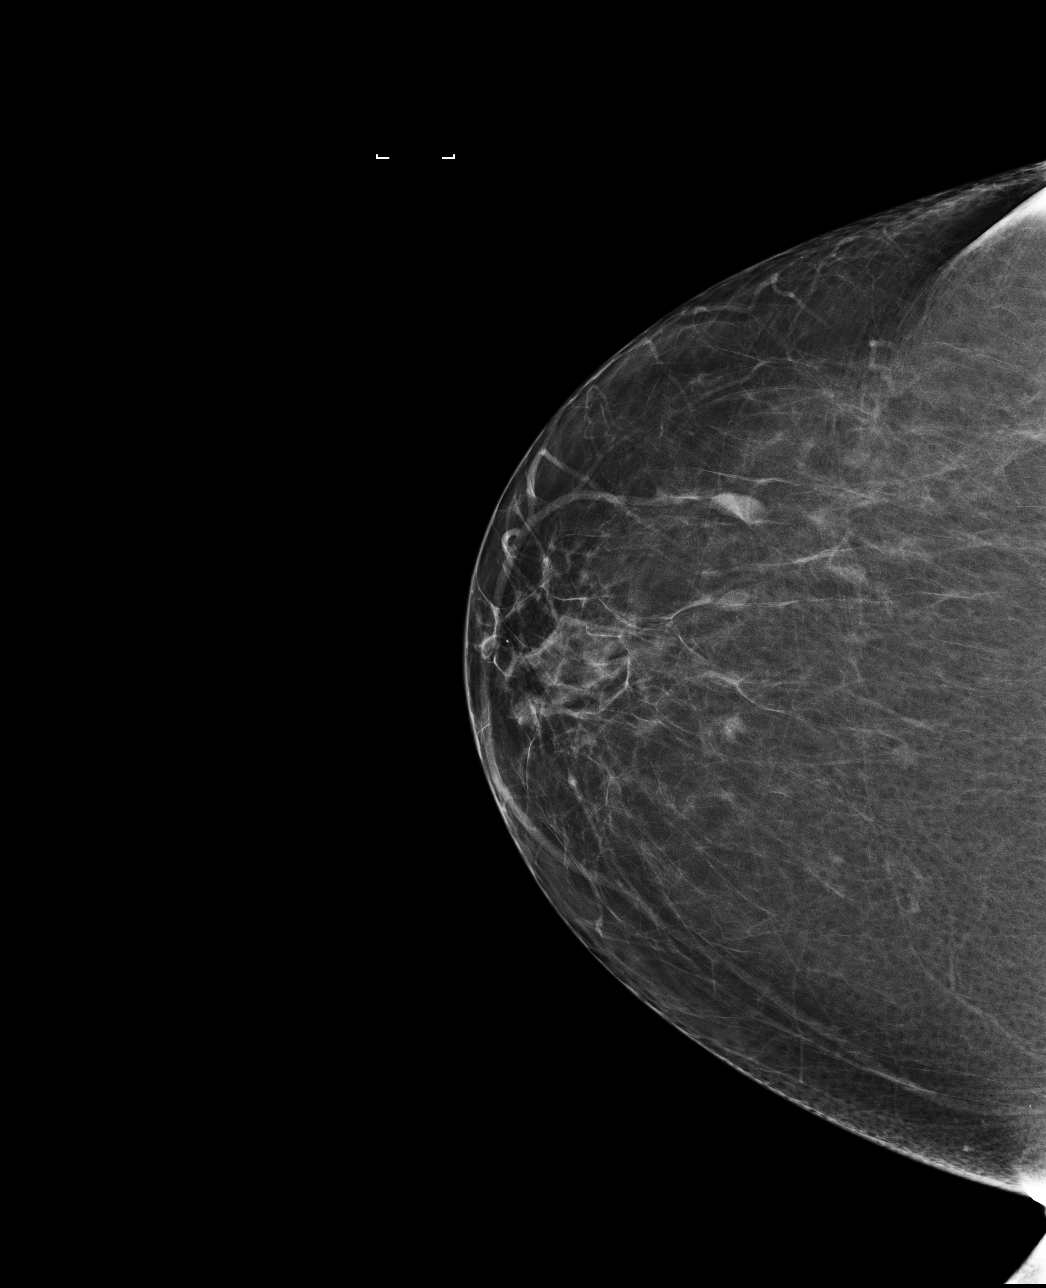

[L MLO]
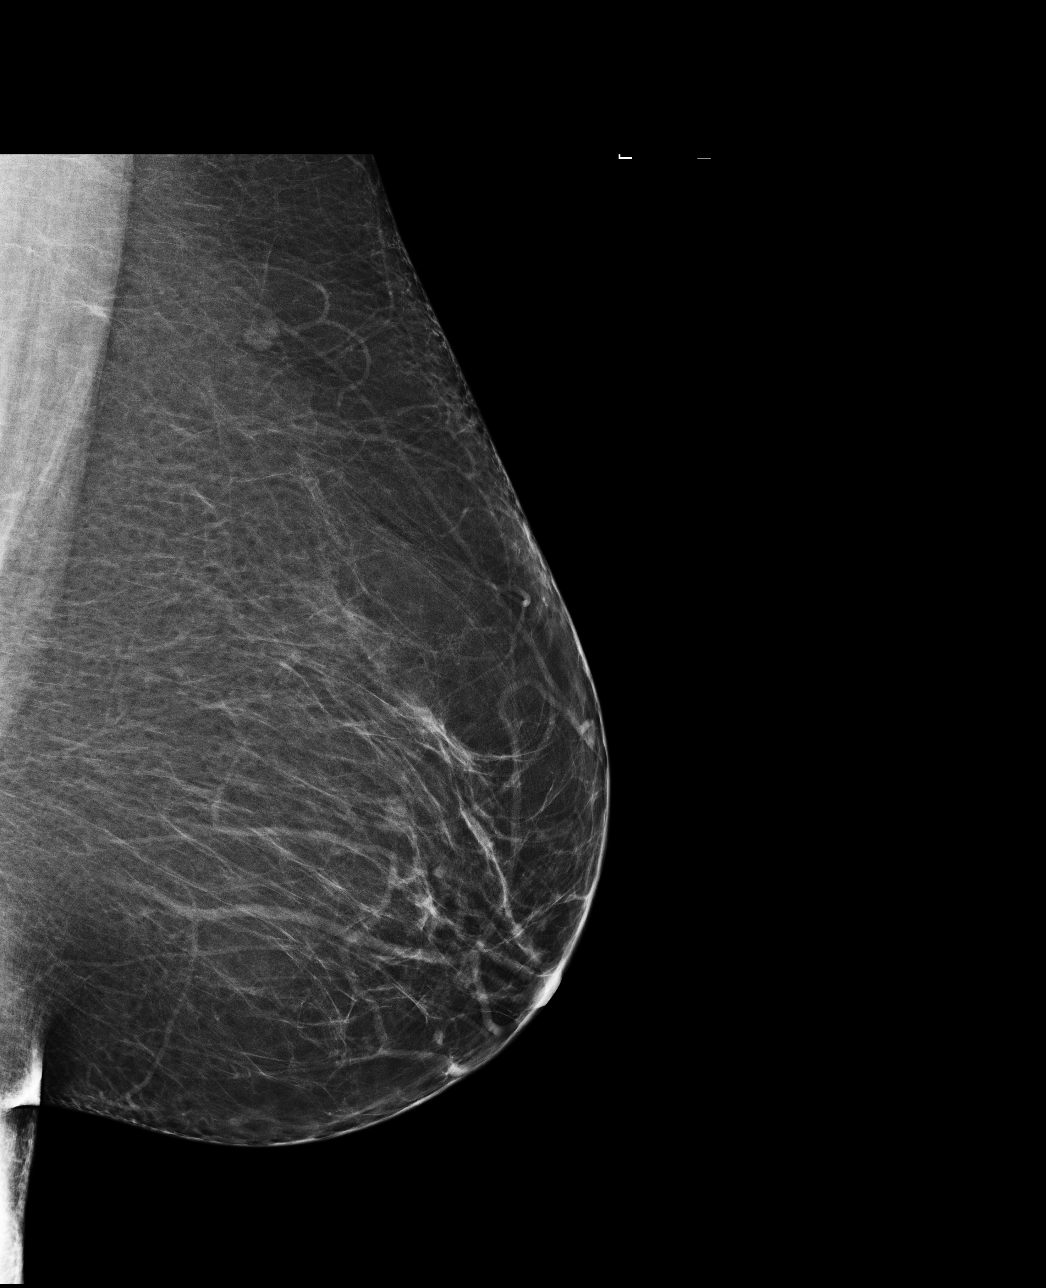

[R MLO]
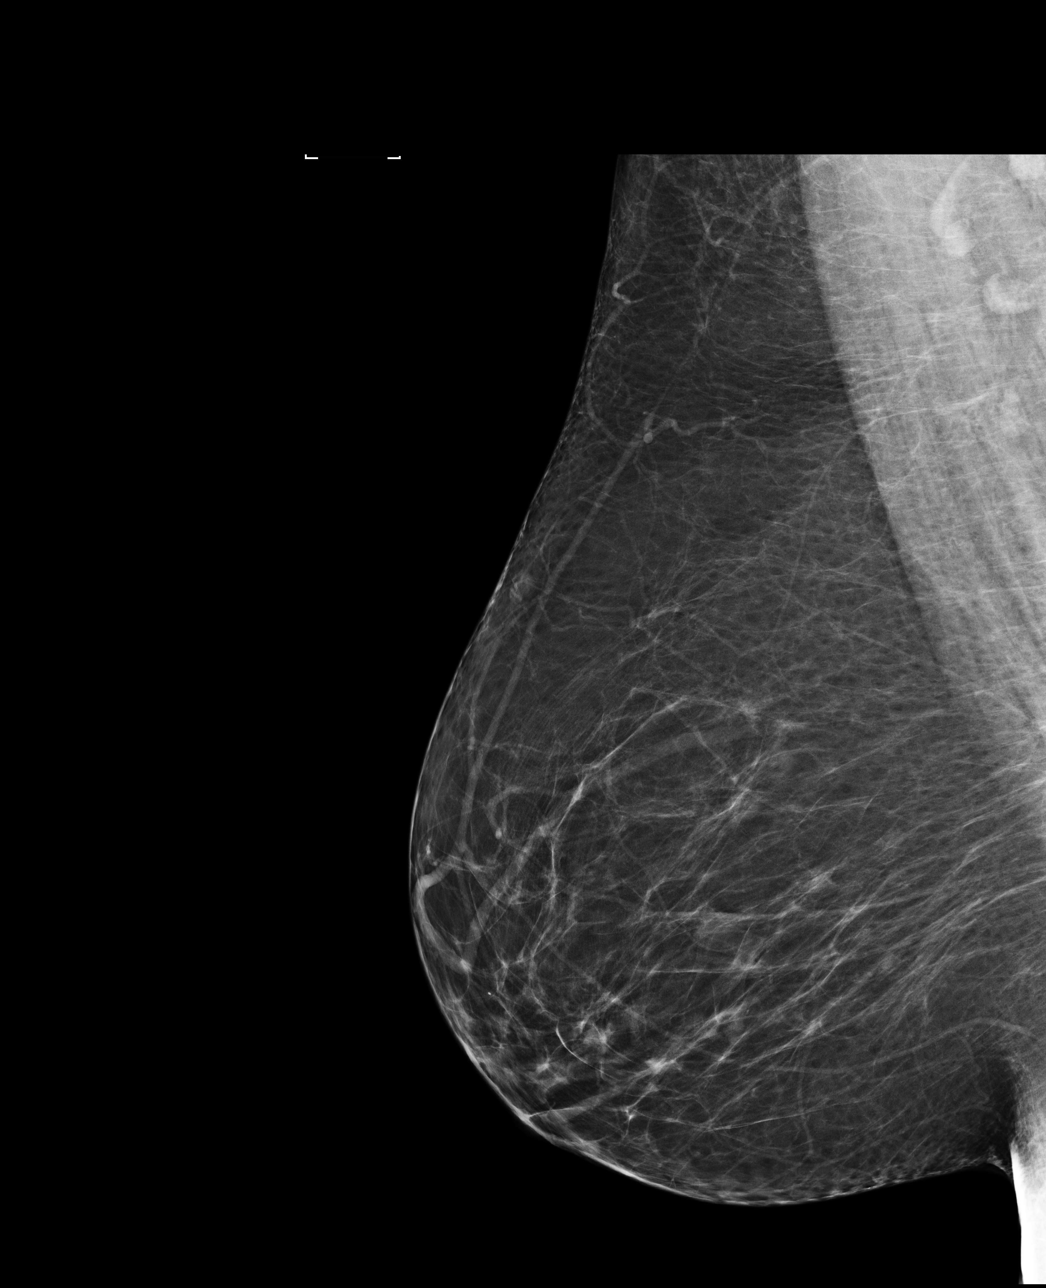

[4 of 4 positions shown; findings below may reference images not displayed]

ACR Breast Density Category b: There are scattered areas of
fibroglandular density.
FINDINGS: There are no findings suspicious for malignancy. Images were
processed with CAD.
IMPRESSION: No mammographic evidence of malignancy. A result letter of this
screening mammogram will be mailed directly to the patient.

RECOMMENDATION:
Screening mammogram in one year. (Code:AS-G-LCT)

BI-RADS CATEGORY  1: Negative.

## 2020-03-19 DIAGNOSIS — Z8543 Personal history of malignant neoplasm of ovary: Secondary | ICD-10-CM | POA: Diagnosis not present

## 2020-03-19 DIAGNOSIS — Z0000 Encounter for general adult medical examination without abnormal findings: Secondary | ICD-10-CM | POA: Diagnosis not present

## 2020-03-19 DIAGNOSIS — E7800 Pure hypercholesterolemia, unspecified: Secondary | ICD-10-CM | POA: Diagnosis not present

## 2020-04-06 DIAGNOSIS — U071 COVID-19: Secondary | ICD-10-CM | POA: Diagnosis not present

## 2020-04-15 ENCOUNTER — Ambulatory Visit (INDEPENDENT_AMBULATORY_CARE_PROVIDER_SITE_OTHER): Payer: Self-pay | Admitting: Family Medicine

## 2020-04-27 ENCOUNTER — Ambulatory Visit (INDEPENDENT_AMBULATORY_CARE_PROVIDER_SITE_OTHER): Payer: Self-pay | Admitting: Family Medicine

## 2020-04-29 ENCOUNTER — Ambulatory Visit (INDEPENDENT_AMBULATORY_CARE_PROVIDER_SITE_OTHER): Payer: Self-pay | Admitting: Family Medicine

## 2020-05-11 ENCOUNTER — Ambulatory Visit (INDEPENDENT_AMBULATORY_CARE_PROVIDER_SITE_OTHER): Payer: Self-pay | Admitting: Family Medicine

## 2020-05-13 ENCOUNTER — Encounter (INDEPENDENT_AMBULATORY_CARE_PROVIDER_SITE_OTHER): Payer: Self-pay | Admitting: Family Medicine

## 2020-05-13 ENCOUNTER — Ambulatory Visit (INDEPENDENT_AMBULATORY_CARE_PROVIDER_SITE_OTHER): Payer: BC Managed Care – PPO | Admitting: Family Medicine

## 2020-05-13 ENCOUNTER — Other Ambulatory Visit: Payer: Self-pay

## 2020-05-13 VITALS — BP 111/71 | HR 81 | Temp 97.6°F | Ht 62.0 in | Wt 197.0 lb

## 2020-05-13 DIAGNOSIS — M810 Age-related osteoporosis without current pathological fracture: Secondary | ICD-10-CM

## 2020-05-13 DIAGNOSIS — Z6836 Body mass index (BMI) 36.0-36.9, adult: Secondary | ICD-10-CM

## 2020-05-13 DIAGNOSIS — E559 Vitamin D deficiency, unspecified: Secondary | ICD-10-CM | POA: Diagnosis not present

## 2020-05-13 DIAGNOSIS — F3289 Other specified depressive episodes: Secondary | ICD-10-CM

## 2020-05-13 DIAGNOSIS — Z9189 Other specified personal risk factors, not elsewhere classified: Secondary | ICD-10-CM

## 2020-05-13 DIAGNOSIS — R5383 Other fatigue: Secondary | ICD-10-CM

## 2020-05-13 DIAGNOSIS — R0602 Shortness of breath: Secondary | ICD-10-CM | POA: Diagnosis not present

## 2020-05-13 DIAGNOSIS — R739 Hyperglycemia, unspecified: Secondary | ICD-10-CM | POA: Diagnosis not present

## 2020-05-13 DIAGNOSIS — C569 Malignant neoplasm of unspecified ovary: Secondary | ICD-10-CM

## 2020-05-13 DIAGNOSIS — R7301 Impaired fasting glucose: Secondary | ICD-10-CM

## 2020-05-13 DIAGNOSIS — R0683 Snoring: Secondary | ICD-10-CM

## 2020-05-13 DIAGNOSIS — E8940 Asymptomatic postprocedural ovarian failure: Secondary | ICD-10-CM

## 2020-05-13 DIAGNOSIS — Z0289 Encounter for other administrative examinations: Secondary | ICD-10-CM

## 2020-05-13 DIAGNOSIS — M8580 Other specified disorders of bone density and structure, unspecified site: Secondary | ICD-10-CM

## 2020-05-14 LAB — COMPREHENSIVE METABOLIC PANEL
ALT: 24 IU/L (ref 0–32)
AST: 21 IU/L (ref 0–40)
Albumin/Globulin Ratio: 1.6 (ref 1.2–2.2)
Albumin: 4.5 g/dL (ref 3.8–4.9)
Alkaline Phosphatase: 64 IU/L (ref 44–121)
BUN/Creatinine Ratio: 23 (ref 9–23)
BUN: 13 mg/dL (ref 6–24)
Bilirubin Total: 0.2 mg/dL (ref 0.0–1.2)
CO2: 24 mmol/L (ref 20–29)
Calcium: 9.3 mg/dL (ref 8.7–10.2)
Chloride: 101 mmol/L (ref 96–106)
Creatinine, Ser: 0.56 mg/dL — ABNORMAL LOW (ref 0.57–1.00)
GFR calc Af Amer: 123 mL/min/{1.73_m2} (ref >59)
GFR calc non Af Amer: 107 mL/min/{1.73_m2} (ref >59)
Globulin, Total: 2.9 g/dL (ref 1.5–4.5)
Glucose: 88 mg/dL (ref 65–99)
Potassium: 4.3 mmol/L (ref 3.5–5.2)
Sodium: 141 mmol/L (ref 134–144)
Total Protein: 7.4 g/dL (ref 6.0–8.5)

## 2020-05-14 LAB — CBC WITH DIFFERENTIAL/PLATELET
Basophils Absolute: 0.1 10*3/uL (ref 0.0–0.2)
Basos: 1 %
EOS (ABSOLUTE): 0.1 10*3/uL (ref 0.0–0.4)
Eos: 1 %
Hemoglobin: 14.1 g/dL (ref 11.1–15.9)
Immature Grans (Abs): 0 10*3/uL (ref 0.0–0.1)
Immature Granulocytes: 0 %
Lymphocytes Absolute: 2.8 10*3/uL (ref 0.7–3.1)
Lymphs: 42 %
MCH: 30 pg (ref 26.6–33.0)
MCHC: 33.2 g/dL (ref 31.5–35.7)
MCV: 90 fL (ref 79–97)
Monocytes Absolute: 0.6 10*3/uL (ref 0.1–0.9)
Monocytes: 9 %
Neutrophils Absolute: 3.1 10*3/uL (ref 1.4–7.0)
Neutrophils: 47 %
Platelets: 269 10*3/uL (ref 150–450)
RBC: 4.7 x10E6/uL (ref 3.77–5.28)
RDW: 12.2 % (ref 11.7–15.4)
WBC: 6.7 10*3/uL (ref 3.4–10.8)

## 2020-05-14 LAB — VITAMIN D 25 HYDROXY (VIT D DEFICIENCY, FRACTURES): Vit D, 25-Hydroxy: 29.2 ng/mL — ABNORMAL LOW (ref 30.0–100.0)

## 2020-05-14 LAB — ANEMIA PANEL
Ferritin: 203 ng/mL — ABNORMAL HIGH (ref 15–150)
Folate, Hemolysate: 402 ng/mL
Folate, RBC: 946 ng/mL (ref >498)
Hematocrit: 42.5 % (ref 34.0–46.6)
Iron Saturation: 22 % (ref 15–55)
Iron: 72 ug/dL (ref 27–159)
Retic Ct Pct: 1.9 % (ref 0.6–2.6)
Total Iron Binding Capacity: 321 ug/dL (ref 250–450)
UIBC: 249 ug/dL (ref 131–425)
Vitamin B-12: 455 pg/mL (ref 232–1245)

## 2020-05-14 LAB — HEMOGLOBIN A1C
Est. average glucose Bld gHb Est-mCnc: 120 mg/dL
Hgb A1c MFr Bld: 5.8 % — ABNORMAL HIGH (ref 4.8–5.6)

## 2020-05-14 LAB — T4, FREE: Free T4: 1.09 ng/dL (ref 0.82–1.77)

## 2020-05-14 LAB — LIPID PANEL
Chol/HDL Ratio: 2.9 ratio (ref 0.0–4.4)
Cholesterol, Total: 187 mg/dL (ref 100–199)
HDL: 64 mg/dL (ref >39)
LDL Chol Calc (NIH): 103 mg/dL — ABNORMAL HIGH (ref 0–99)
Triglycerides: 113 mg/dL (ref 0–149)
VLDL Cholesterol Cal: 20 mg/dL (ref 5–40)

## 2020-05-14 LAB — INSULIN, RANDOM: INSULIN: 17.7 u[IU]/mL (ref 2.6–24.9)

## 2020-05-14 LAB — TSH: TSH: 1.11 u[IU]/mL (ref 0.450–4.500)

## 2020-05-18 NOTE — Progress Notes (Signed)
Chief Complaint:   OBESITY Carmen Garza (MR# [id number]) is a 55 y.o. female who presents for evaluation and treatment of obesity and related comorbidities. Current BMI is Body mass index is 36.03 kg/m. Carmen Garza has been struggling with her weight for many years and has been unsuccessful in either losing weight, maintaining weight loss, or reaching her healthy weight goal.  Carmen Garza is currently in the action stage of change and ready to dedicate time achieving and maintaining a healthier weight. Carmen Garza is interested in becoming our patient and working on intensive lifestyle modifications including (but not limited to) diet and exercise for weight loss.  Carmen Garza's daughter-in-law is in the program.  She is working with Carmen Garza and is down 30 pounds.  She is on Category 4.  Carmen Garza's habits were reviewed today and are as follows: Her family eats meals together, she thinks her family will eat healthier with her, her desired weight loss is 81 pounds, she has been heavy most of her life, she started gaining excessive weight 5-6 years ago, her heaviest weight ever was 201 pounds, she craves salty foods, she snacks frequently in the evenings, she is frequently drinking liquids with calories, she frequently makes poor food choices, she frequently eats larger portions than normal and she struggles with emotional eating.  Depression Screen Carmen Garza's Food and Mood (modified PHQ-9) score was 10.  Depression screen PHQ 2/9 05/13/2020  Decreased Interest 1  Down, Depressed, Hopeless 1  PHQ - 2 Score 2  Altered sleeping 1  Tired, decreased energy 2  Change in appetite 2  Feeling bad or failure about yourself  1  Trouble concentrating 0  Moving slowly or fidgety/restless 2  Suicidal thoughts 0  PHQ-9 Score 10  Difficult doing work/chores Not difficult at all   Assessment/Plan:   1. Other fatigue Carmen Garza denies daytime somnolence and denies waking up still tired. Patent has a history of symptoms of  snoring. Carmen Garza generally gets 7 or 8 hours of sleep per night, and states that she has generally restful sleep. Snoring is present. Apneic episodes are not present. Epworth Sleepiness Score is 5.  Carmen Garza does feel that her weight is causing her energy to be lower than it should be. Fatigue may be related to obesity, depression or many other causes. Labs will be ordered, and in the meanwhile, Carmen Garza will focus on self care including making healthy food choices, increasing physical activity and focusing on stress reduction.  - EKG 12-Lead - Anemia panel - CBC with Differential/Platelet - Comprehensive metabolic panel - Hemoglobin A1c - Insulin, random - Lipid panel - TSH - T4, free  2. SOB (shortness of breath) on exertion Carmen Garza notes increasing shortness of breath with exercising and seems to be worsening over time with weight gain. She notes getting out of breath sooner with activity than she used to. This has gotten worse recently. Carmen Garza denies shortness of breath at rest or orthopnea.  Carmen Garza does feel that she gets out of breath more easily that she used to when she exercises. Carmen Garza's shortness of breath appears to be obesity related and exercise induced. She has agreed to work on weight loss and gradually increase exercise to treat her exercise induced shortness of breath. Will continue to monitor closely.  - CBC with Differential/Platelet - Comprehensive metabolic panel - Lipid panel  3. Vitamin D deficiency Optimal goal > 50 ng/dL.   Plan:  Will check vitamin D level today, as per below.  - VITAMIN D 25  Hydroxy (Vit-D Deficiency, Fractures)  4. Osteoporosis without current pathological fracture, unspecified osteoporosis type Carmen Garza has family history of osteoporosis.  She is scheduled for DEXA in March 2022.  Plan:  Will check vitamin D level today, as per below.  - VITAMIN D 25 Hydroxy (Vit-D Deficiency, Fractures)  5. Surgical menopause Carmen Garza had an abdominal  hysterectomy in 2011.  She is taking Effexor 37.5 mg daily.  6. Malignant neoplasm of ovary, unspecified laterality (Carmen Garza) Status post hysterectomy and chemotherapy; 12 years out.  Followed by GYN/Oncology.  Plan:  Will continue to follow along.  7. Snores Carmen Garza endorses snoring.  Her Epworth score is 5.  8. Elevated fasting glucose Will check A1c and insulin level today, as per below.  - Hemoglobin A1c - Insulin, random  9. Other depression, with emotional eating Not at goal. Medication: Effexor 37.5 mg daily.  Carmen Garza eats when stressed, sad, bored, and for comfort.  She overeats.  She endorses binge eating <1 time per month.  PHQ-9 is 10.  Behavior modification techniques were discussed today to help deal with emotional/non-hunger eating behaviors.  10. At risk for heart disease Due to Carmen Garza's current state of health and medical condition(s), she is at a higher risk for heart disease.  This puts the patient at much greater risk to subsequently develop cardiopulmonary conditions that can significantly affect patient's quality of life in a negative manner.    At least 10 minutes were spent on counseling Carmen Garza about these concerns today. Counseling:  Intensive lifestyle modifications were discussed with Carmen Garza as the most appropriate first line of treatment.  she will continue to work on diet, exercise, and weight loss efforts.  We will continue to reassess these conditions on a fairly regular basis in an attempt to decrease the patient's overall morbidity and mortality.  Evidence-based interventions for health behavior change were utilized today including the discussion of self monitoring techniques, problem-solving barriers, and SMART goal setting techniques.  Specifically, regarding patient's less desirable eating habits and patterns, we employed the technique of small changes when Carmen Garza has not been able to fully commit to her prudent nutritional plan.  11. Class 2 severe obesity  with serious comorbidity and body mass index (BMI) of 36.0 to 36.9 in adult, unspecified obesity type Carmen Garza)  Carmen Garza is currently in the action stage of change and her goal is to continue with weight loss efforts. I recommend Carmen Garza begin the structured treatment plan as follows:  She has agreed to the Category 2 Plan.  Exercise goals: No exercise has been prescribed at this time.   Behavioral modification strategies: increasing lean protein intake, decreasing simple carbohydrates, increasing vegetables, increasing water intake, decreasing liquid calories, decreasing sodium intake and increasing high fiber foods.  She was informed of the importance of frequent follow-up visits to maximize her success with intensive lifestyle modifications for her multiple health conditions. She was informed we would discuss her lab results at her next visit unless there is a critical issue that needs to be addressed sooner. Carmen Garza agreed to keep her next visit at the agreed upon time to discuss these results.  Objective:   Blood pressure 111/71, pulse 81, temperature 97.6 F (36.4 C), temperature source Oral, height 5\' 2"  (1.575 m), weight 197 lb (89.4 kg), SpO2 96 %. Body mass index is 36.03 kg/m.  EKG: Normal sinus rhythm, rate 81 bpm.  Indirect Calorimeter completed today shows a VO2 of 255 and a REE of 1773.  Her calculated basal metabolic rate is 1224  thus her basal metabolic rate is better than expected.  General: Cooperative, alert, well developed, in no acute distress. HEENT: Conjunctivae and lids unremarkable. Cardiovascular: Regular rhythm.  Lungs: Normal work of breathing. Neurologic: No focal deficits.   Lab Results  Component Value Date   CREATININE 0.56 (L) 05/13/2020   BUN 13 05/13/2020   NA 141 05/13/2020   K 4.3 05/13/2020   CL 101 05/13/2020   CO2 24 05/13/2020   Lab Results  Component Value Date   ALT 24 05/13/2020   AST 21 05/13/2020   ALKPHOS 64 05/13/2020   BILITOT 0.2  05/13/2020   Lab Results  Component Value Date   HGBA1C 5.8 (H) 05/13/2020   Lab Results  Component Value Date   INSULIN 17.7 05/13/2020   Lab Results  Component Value Date   TSH 1.110 05/13/2020   Lab Results  Component Value Date   CHOL 187 05/13/2020   HDL 64 05/13/2020   LDLCALC 103 (H) 05/13/2020   LDLDIRECT 143.6 07/22/2007   TRIG 113 05/13/2020   CHOLHDL 2.9 05/13/2020   Lab Results  Component Value Date   WBC 6.7 05/13/2020   HGB 14.1 05/13/2020   HCT 42.5 05/13/2020   MCV 90 05/13/2020   PLT 269 05/13/2020   Lab Results  Component Value Date   IRON 72 05/13/2020   TIBC 321 05/13/2020   FERRITIN 203 (H) 05/13/2020   Attestation Statements:   This is the patient's first visit at [facility]. The patient's NEW PATIENT PACKET was reviewed at length. Included in the packet: current and past health history, medications, allergies, ROS, gynecologic history (women only), surgical history, family history, social history, weight history, weight loss surgery history (for those that have had weight loss surgery), nutritional evaluation, mood and food questionnaire, PHQ9, Epworth questionnaire, sleep habits questionnaire, patient life and health improvement goals questionnaire. These will all be scanned into the patient's chart under media.   During the visit, I independently reviewed the patient's EKG, bioimpedance scale results, and indirect calorimeter results. I used this information to tailor a meal plan for the patient that will help her to lose weight and will improve her obesity-related conditions going forward. I performed a medically necessary appropriate examination and/or evaluation. I discussed the assessment and treatment plan with the patient. The patient was provided an opportunity to ask questions and all were answered. The patient agreed with the plan and demonstrated an understanding of the instructions. Labs were ordered at this visit and will  be reviewed at the next visit unless more critical results need to be addressed immediately. Clinical information was updated and documented in the EMR.   I, Water quality scientist, CMA, am acting as transcriptionist for Briscoe Deutscher, DO  I have reviewed the above documentation for accuracy and completeness, and I agree with the above. Briscoe Deutscher, DO

## 2020-05-21 ENCOUNTER — Other Ambulatory Visit: Payer: Self-pay | Admitting: Family Medicine

## 2020-05-21 DIAGNOSIS — Z1231 Encounter for screening mammogram for malignant neoplasm of breast: Secondary | ICD-10-CM

## 2020-05-27 ENCOUNTER — Encounter (INDEPENDENT_AMBULATORY_CARE_PROVIDER_SITE_OTHER): Payer: Self-pay

## 2020-05-27 ENCOUNTER — Encounter (INDEPENDENT_AMBULATORY_CARE_PROVIDER_SITE_OTHER): Payer: Self-pay | Admitting: Family Medicine

## 2020-05-27 ENCOUNTER — Other Ambulatory Visit: Payer: Self-pay

## 2020-05-27 ENCOUNTER — Ambulatory Visit (INDEPENDENT_AMBULATORY_CARE_PROVIDER_SITE_OTHER): Payer: BC Managed Care – PPO | Admitting: Family Medicine

## 2020-05-27 ENCOUNTER — Other Ambulatory Visit: Payer: Self-pay | Admitting: Family Medicine

## 2020-05-27 VITALS — BP 100/62 | HR 79 | Temp 97.6°F | Ht 62.0 in | Wt 191.0 lb

## 2020-05-27 DIAGNOSIS — R7303 Prediabetes: Secondary | ICD-10-CM

## 2020-05-27 DIAGNOSIS — F3289 Other specified depressive episodes: Secondary | ICD-10-CM | POA: Diagnosis not present

## 2020-05-27 DIAGNOSIS — E559 Vitamin D deficiency, unspecified: Secondary | ICD-10-CM | POA: Insufficient documentation

## 2020-05-27 DIAGNOSIS — Z780 Asymptomatic menopausal state: Secondary | ICD-10-CM | POA: Diagnosis not present

## 2020-05-27 DIAGNOSIS — E8940 Asymptomatic postprocedural ovarian failure: Secondary | ICD-10-CM | POA: Insufficient documentation

## 2020-05-27 DIAGNOSIS — E669 Obesity, unspecified: Secondary | ICD-10-CM

## 2020-05-27 DIAGNOSIS — F32A Depression, unspecified: Secondary | ICD-10-CM | POA: Insufficient documentation

## 2020-05-27 DIAGNOSIS — Z6834 Body mass index (BMI) 34.0-34.9, adult: Secondary | ICD-10-CM

## 2020-05-27 DIAGNOSIS — Z9189 Other specified personal risk factors, not elsewhere classified: Secondary | ICD-10-CM

## 2020-05-27 DIAGNOSIS — M816 Localized osteoporosis [Lequesne]: Secondary | ICD-10-CM

## 2020-05-27 MED ORDER — VITAMIN D (ERGOCALCIFEROL) 1.25 MG (50000 UNIT) PO CAPS: 50000.0000 [IU] | ORAL_CAPSULE | ORAL | 0 refills | Status: DC

## 2020-06-01 NOTE — Progress Notes (Signed)
Chief Complaint:   OBESITY Carmen Garza is here to discuss her progress with her obesity treatment plan along with follow-up of her obesity related diagnoses.   Today's visit was #: 2 Starting weight: 197 lbs Starting date: 05/13/2020 Today's weight: 191 lbs Today's date: 05/27/2020 Total lbs lost to date: 6 lbs Body mass index is 34.93 kg/m.  Total weight loss percentage to date: -3.05%  Interim History:  Carmen Garza denies polyphagia or cravings.  She says she is happy with her meals.  She would like more cereal options. Current Meal Plan: the Category 2 Plan for 85% of the time.  Current Exercise Plan: None at this time.  Assessment/Plan:   1. Vitamin D deficiency Not at goal. Current vitamin D is 29.2, tested on 05/13/2020. Optimal goal > 50 ng/dL.   Plan:  Start to take prescription Vitamin D @50 ,000 IU every week as prescribed.  Follow-up for routine testing of Vitamin D, at least 2-3 times per year to avoid over-replacement.  - Start Vitamin D, Ergocalciferol, (DRISDOL) 1.25 MG (50000 UNIT) CAPS capsule; Take 1 capsule (50,000 Units total) by mouth every 7 (seven) days.  Dispense: 4 capsule; Refill: 0  2. Prediabetes Not at goal. Goal is HgbA1c < 5.7.  Medication: None.    Plan:  She will continue to focus on protein-rich, low simple carbohydrate foods. We reviewed the importance of hydration, regular exercise for stress reduction, and restorative sleep.   Lab Results  Component Value Date   HGBA1C 5.8 (H) 05/13/2020   Lab Results  Component Value Date   INSULIN 17.7 05/13/2020   3. Postmenopausal Carmen Garza had an abdominal hysterectomy in 2011.  She is taking Effexor 37.5 mg daily. We will continue to monitor symptoms as they relate to her weight loss journey.  4. Other depression, with emotional eating Not at goal. Medication: Effexor 37.5 mg daily.  Carmen Garza eats when stressed, sad, bored, and for comfort.  She overeats.  She endorses binge eating <1 time per month.  Plan:   Behavior modification techniques were discussed today to help deal with emotional/non-hunger eating behaviors.  5. At risk for heart disease Due to Carmen Garza's current state of health and medical condition(s), she is at a higher risk for heart disease.  This puts the patient at much greater risk to subsequently develop cardiopulmonary conditions that can significantly affect patient's quality of life in a negative manner.    At least 8 minutes were spent on counseling Carmen Garza about these concerns today. Counseling:  Intensive lifestyle modifications were discussed with Carmen Garza as the most appropriate first line of treatment.  she will continue to work on diet, exercise, and weight loss efforts.  We will continue to reassess these conditions on a fairly regular basis in an attempt to decrease the patient's overall morbidity and mortality.  Evidence-based interventions for health behavior change were utilized today including the discussion of self monitoring techniques, problem-solving barriers, and SMART goal setting techniques.  Specifically, regarding patient's less desirable eating habits and patterns, we employed the technique of small changes when Carmen Garza has not been able to fully commit to her prudent nutritional plan.  6. Class 1 obesity with serious comorbidity and body mass index (BMI) of 34.0 to 34.9 in adult, unspecified obesity type  Course: Carmen Garza is currently in the action stage of change. As such, her goal is to continue with weight loss efforts.   Nutrition goals: She has agreed to the Category 2 Plan.   Exercise goals: 3 times  per week for 20 minutes.  Behavioral modification strategies: increasing lean protein intake, decreasing simple carbohydrates, increasing vegetables and increasing water intake.  Carmen Garza has agreed to follow-up with our clinic in 2 weeks. She was informed of the importance of frequent follow-up visits to maximize her success with intensive lifestyle modifications  for her multiple health conditions.   Objective:   Blood pressure 100/62, pulse 79, temperature 97.6 F (36.4 C), temperature source Oral, height 5\' 2"  (1.575 m), weight 191 lb (86.6 kg), SpO2 99 %. Body mass index is 34.93 kg/m.  General: Cooperative, alert, well developed, in no acute distress. HEENT: Conjunctivae and lids unremarkable. Cardiovascular: Regular rhythm.  Lungs: Normal work of breathing. Neurologic: No focal deficits.   Lab Results  Component Value Date   CREATININE 0.56 (L) 05/13/2020   BUN 13 05/13/2020   NA 141 05/13/2020   K 4.3 05/13/2020   CL 101 05/13/2020   CO2 24 05/13/2020   Lab Results  Component Value Date   ALT 24 05/13/2020   AST 21 05/13/2020   ALKPHOS 64 05/13/2020   BILITOT 0.2 05/13/2020   Lab Results  Component Value Date   HGBA1C 5.8 (H) 05/13/2020   Lab Results  Component Value Date   INSULIN 17.7 05/13/2020   Lab Results  Component Value Date   TSH 1.110 05/13/2020   Lab Results  Component Value Date   CHOL 187 05/13/2020   HDL 64 05/13/2020   LDLCALC 103 (H) 05/13/2020   LDLDIRECT 143.6 07/22/2007   TRIG 113 05/13/2020   CHOLHDL 2.9 05/13/2020   Lab Results  Component Value Date   WBC 6.7 05/13/2020   HGB 14.1 05/13/2020   HCT 42.5 05/13/2020   MCV 90 05/13/2020   PLT 269 05/13/2020   Lab Results  Component Value Date   IRON 72 05/13/2020   TIBC 321 05/13/2020   FERRITIN 203 (H) 05/13/2020   Attestation Statements:   Reviewed by clinician on day of visit: allergies, medications, problem list, medical history, surgical history, family history, social history, and previous encounter notes.  I, Water quality scientist, CMA, am acting as transcriptionist for Briscoe Deutscher, DO  I have reviewed the above documentation for accuracy and completeness, and I agree with the above. Briscoe Deutscher, DO

## 2020-06-16 ENCOUNTER — Ambulatory Visit (INDEPENDENT_AMBULATORY_CARE_PROVIDER_SITE_OTHER): Payer: BC Managed Care – PPO | Admitting: Family Medicine

## 2020-06-16 ENCOUNTER — Other Ambulatory Visit: Payer: Self-pay

## 2020-06-16 ENCOUNTER — Encounter (INDEPENDENT_AMBULATORY_CARE_PROVIDER_SITE_OTHER): Payer: Self-pay | Admitting: Family Medicine

## 2020-06-16 VITALS — BP 109/75 | HR 83 | Temp 97.7°F | Ht 62.0 in | Wt 186.0 lb

## 2020-06-16 DIAGNOSIS — F3289 Other specified depressive episodes: Secondary | ICD-10-CM | POA: Diagnosis not present

## 2020-06-16 DIAGNOSIS — Z6834 Body mass index (BMI) 34.0-34.9, adult: Secondary | ICD-10-CM | POA: Diagnosis not present

## 2020-06-16 DIAGNOSIS — E669 Obesity, unspecified: Secondary | ICD-10-CM | POA: Diagnosis not present

## 2020-06-17 NOTE — Progress Notes (Signed)
Chief Complaint:   OBESITY Carmen Garza is here to discuss her progress with her obesity treatment plan along with follow-up of her obesity related diagnoses. Carmen Garza is on the Category 2 Plan and states she is following her eating plan approximately 80% of the time. Carmen Garza states she is walks for 10 minutes 3 times per week.  Today's visit was #: 3 Starting weight: 197 lbs Starting date: 05/13/2020 Today's weight: 186 lbs Today's date: 06/16/2020 Total lbs lost to date: 11 Total lbs lost since last in-office visit: 5  Interim History: Carmen Garza's hunger is satisfied overall. She notes cravings for salt (chips). She is happy with the plan and she likes all of the food. She is able to eat all of the food on the meal plan. She had a recent birthday and they bought fruit for her at work rather than a cake.  Subjective:   1. Other depression, with emotional eating Carmen Garza has cravings for salty items like chips. She is on Effexor for hot flashes. Mood stable.  Assessment/Plan:   1. Other depression, with emotional eating . We discussed bupropion and she declined. I advised her that she can use extra calories for chips.    2. Class 1 obesity with serious comorbidity and body mass index (BMI) of 34.0 to 34.9 in adult, unspecified obesity type Carmen Garza is currently in the action stage of change. As such, her goal is to continue with weight loss efforts. She has agreed to the Category 2 Plan.   Exercise goals: As is.  Behavioral modification strategies: meal planning and cooking strategies.  Carmen Garza has agreed to follow-up with our clinic in 2 weeks with Dr. Juleen China, and in 4 weeks with myself.   Objective:   Blood pressure 109/75, pulse 83, temperature 97.7 F (36.5 C), height 5\' 2"  (1.575 m), weight 186 lb (84.4 kg), SpO2 97 %. Body mass index is 34.02 kg/m.  General: Cooperative, alert, well developed, in no acute distress. HEENT: Conjunctivae and lids unremarkable. Cardiovascular:  Regular rhythm.  Lungs: Normal work of breathing. Neurologic: No focal deficits.   Lab Results  Component Value Date   CREATININE 0.56 (L) 05/13/2020   BUN 13 05/13/2020   NA 141 05/13/2020   K 4.3 05/13/2020   CL 101 05/13/2020   CO2 24 05/13/2020   Lab Results  Component Value Date   ALT 24 05/13/2020   AST 21 05/13/2020   ALKPHOS 64 05/13/2020   BILITOT 0.2 05/13/2020   Lab Results  Component Value Date   HGBA1C 5.8 (H) 05/13/2020   Lab Results  Component Value Date   INSULIN 17.7 05/13/2020   Lab Results  Component Value Date   TSH 1.110 05/13/2020   Lab Results  Component Value Date   CHOL 187 05/13/2020   HDL 64 05/13/2020   LDLCALC 103 (H) 05/13/2020   LDLDIRECT 143.6 07/22/2007   TRIG 113 05/13/2020   CHOLHDL 2.9 05/13/2020   Lab Results  Component Value Date   WBC 6.7 05/13/2020   HGB 14.1 05/13/2020   HCT 42.5 05/13/2020   MCV 90 05/13/2020   PLT 269 05/13/2020   Lab Results  Component Value Date   IRON 72 05/13/2020   TIBC 321 05/13/2020   FERRITIN 203 (H) 05/13/2020   Attestation Statements:   Reviewed by clinician on day of visit: allergies, medications, problem list, medical history, surgical history, family history, social history, and previous encounter notes.   Wilhemena Durie, am acting as Location manager for [employer]  Biran Mayberry, FNP-C.  I have reviewed the above documentation for accuracy and completeness, and I agree with the above. -  Georgianne Fick, FNP

## 2020-06-22 ENCOUNTER — Encounter (INDEPENDENT_AMBULATORY_CARE_PROVIDER_SITE_OTHER): Payer: Self-pay | Admitting: Family Medicine

## 2020-06-22 DIAGNOSIS — E669 Obesity, unspecified: Secondary | ICD-10-CM | POA: Insufficient documentation

## 2020-06-30 ENCOUNTER — Ambulatory Visit (INDEPENDENT_AMBULATORY_CARE_PROVIDER_SITE_OTHER): Payer: BC Managed Care – PPO | Admitting: Bariatrics

## 2020-07-07 ENCOUNTER — Other Ambulatory Visit: Payer: Self-pay

## 2020-07-07 ENCOUNTER — Encounter (INDEPENDENT_AMBULATORY_CARE_PROVIDER_SITE_OTHER): Payer: Self-pay | Admitting: Family Medicine

## 2020-07-07 ENCOUNTER — Ambulatory Visit (INDEPENDENT_AMBULATORY_CARE_PROVIDER_SITE_OTHER): Payer: BC Managed Care – PPO | Admitting: Family Medicine

## 2020-07-07 VITALS — BP 102/69 | HR 74 | Temp 97.5°F | Ht 62.0 in | Wt 181.0 lb

## 2020-07-07 DIAGNOSIS — Z9189 Other specified personal risk factors, not elsewhere classified: Secondary | ICD-10-CM | POA: Diagnosis not present

## 2020-07-07 DIAGNOSIS — Z6836 Body mass index (BMI) 36.0-36.9, adult: Secondary | ICD-10-CM

## 2020-07-07 DIAGNOSIS — E559 Vitamin D deficiency, unspecified: Secondary | ICD-10-CM | POA: Diagnosis not present

## 2020-07-07 MED ORDER — VITAMIN D (ERGOCALCIFEROL) 1.25 MG (50000 UNIT) PO CAPS: 50000.0000 [IU] | ORAL_CAPSULE | ORAL | 0 refills | Status: DC

## 2020-07-14 ENCOUNTER — Ambulatory Visit (INDEPENDENT_AMBULATORY_CARE_PROVIDER_SITE_OTHER): Payer: BC Managed Care – PPO | Admitting: Family Medicine

## 2020-07-14 NOTE — Progress Notes (Signed)
Chief Complaint:   OBESITY Carmen Garza is here to discuss her progress with her obesity treatment plan along with follow-up of her obesity related diagnoses. Carmen Garza is on the Category 2 Plan and states she is following her eating plan approximately 80% of the time. Carmen Garza states she is walking for 10 minutes 3 times per week.  Today's visit was #: 4 Starting weight: 197 lbs Starting date: 05/13/2020 Today's weight: 181 lbs Today's date: 07/07/2020 Total lbs lost to date: 16 Total lbs lost since last in-office visit: 5  Interim History: Carmen Garza continues to do well with weight loss. She states her hunger is mostly controlled and she is doing well with meal planning. She has some unintentional sabotage from her mother.  Subjective:   1. Vitamin D deficiency Carmen Garza is stable on Vit D prescription, and she denies nausea or vomiting.  2. At risk for impaired metabolic function Carmen Garza is at increased risk for impaired metabolic function if protein decreases.  Assessment/Plan:   1. Vitamin D deficiency Low Vitamin D level contributes to fatigue and are associated with obesity, breast, and colon cancer. We will refill prescription Vitamin D for 1 month. Carmen Garza will follow-up for routine testing of Vitamin D, at least 2-3 times per year to avoid over-replacement.  - Vitamin D, Ergocalciferol, (DRISDOL) 1.25 MG (50000 UNIT) CAPS capsule; Take 1 capsule (50,000 Units total) by mouth every 7 (seven) days.  Dispense: 4 capsule; Refill: 0  2. At risk for impaired metabolic function Carmen Garza was given approximately 15 minutes of impaired  metabolic function prevention counseling today. We discussed intensive lifestyle modifications today with an emphasis on specific nutrition and exercise instructions and strategies.   Repetitive spaced learning was employed today to elicit superior memory formation and behavioral change.  3. Obesity BMI today is 36 Carmen Garza is currently in the action stage of  change. As such, her goal is to continue with weight loss efforts. She has agreed to the Category 2 Plan.   Exercise goals: As is.  Behavioral modification strategies: increasing lean protein intake and dealing with family or coworker sabotage.  Carmen Garza has agreed to follow-up with our clinic in 3 to 4 weeks. She was informed of the importance of frequent follow-up visits to maximize her success with intensive lifestyle modifications for her multiple health conditions.   Objective:   Blood pressure 102/69, pulse 74, temperature (!) 97.5 F (36.4 C), height 5\' 2"  (1.575 m), weight 181 lb (82.1 kg), SpO2 98 %. Body mass index is 33.11 kg/m.  General: Cooperative, alert, well developed, in no acute distress. HEENT: Conjunctivae and lids unremarkable. Cardiovascular: Regular rhythm.  Lungs: Normal work of breathing. Neurologic: No focal deficits.   Lab Results  Component Value Date   CREATININE 0.56 (L) 05/13/2020   BUN 13 05/13/2020   NA 141 05/13/2020   K 4.3 05/13/2020   CL 101 05/13/2020   CO2 24 05/13/2020   Lab Results  Component Value Date   ALT 24 05/13/2020   AST 21 05/13/2020   ALKPHOS 64 05/13/2020   BILITOT 0.2 05/13/2020   Lab Results  Component Value Date   HGBA1C 5.8 (H) 05/13/2020   Lab Results  Component Value Date   INSULIN 17.7 05/13/2020   Lab Results  Component Value Date   TSH 1.110 05/13/2020   Lab Results  Component Value Date   CHOL 187 05/13/2020   HDL 64 05/13/2020   LDLCALC 103 (H) 05/13/2020   LDLDIRECT 143.6 07/22/2007  TRIG 113 05/13/2020   CHOLHDL 2.9 05/13/2020   Lab Results  Component Value Date   WBC 6.7 05/13/2020   HGB 14.1 05/13/2020   HCT 42.5 05/13/2020   MCV 90 05/13/2020   PLT 269 05/13/2020   Lab Results  Component Value Date   IRON 72 05/13/2020   TIBC 321 05/13/2020   FERRITIN 203 (H) 05/13/2020   Attestation Statements:   Reviewed by clinician on day of visit: allergies, medications, problem list,  medical history, surgical history, family history, social history, and previous encounter notes.   I, Trixie Dredge, am acting as transcriptionist for Dennard Nip, MD.  I have reviewed the above documentation for accuracy and completeness, and I agree with the above. -  Dennard Nip, MD

## 2020-07-15 ENCOUNTER — Inpatient Hospital Stay: Admission: RE | Admit: 2020-07-15 | Payer: BC Managed Care – PPO | Source: Ambulatory Visit

## 2020-07-20 ENCOUNTER — Ambulatory Visit
Admission: RE | Admit: 2020-07-20 | Discharge: 2020-07-20 | Disposition: A | Discharge status: Home / Self Care | Payer: BC Managed Care – PPO | Source: Ambulatory Visit | Attending: Family Medicine | Admitting: Family Medicine

## 2020-07-20 ENCOUNTER — Other Ambulatory Visit: Payer: Self-pay

## 2020-07-20 ENCOUNTER — Ambulatory Visit: Payer: BC Managed Care – PPO

## 2020-07-20 DIAGNOSIS — Z1231 Encounter for screening mammogram for malignant neoplasm of breast: Secondary | ICD-10-CM

## 2020-07-21 ENCOUNTER — Ambulatory Visit (INDEPENDENT_AMBULATORY_CARE_PROVIDER_SITE_OTHER): Payer: BC Managed Care – PPO | Admitting: Family Medicine

## 2020-07-21 ENCOUNTER — Encounter (INDEPENDENT_AMBULATORY_CARE_PROVIDER_SITE_OTHER): Payer: Self-pay | Admitting: Family Medicine

## 2020-07-21 VITALS — BP 108/72 | HR 78 | Temp 97.5°F | Ht 62.0 in | Wt 179.0 lb

## 2020-07-21 DIAGNOSIS — E559 Vitamin D deficiency, unspecified: Secondary | ICD-10-CM

## 2020-07-21 DIAGNOSIS — Z6834 Body mass index (BMI) 34.0-34.9, adult: Secondary | ICD-10-CM | POA: Diagnosis not present

## 2020-07-21 DIAGNOSIS — R7303 Prediabetes: Secondary | ICD-10-CM | POA: Diagnosis not present

## 2020-07-21 DIAGNOSIS — E669 Obesity, unspecified: Secondary | ICD-10-CM

## 2020-07-23 NOTE — Progress Notes (Signed)
Chief Complaint:   OBESITY Carmen Garza is here to discuss her progress with her obesity treatment plan along with follow-up of her obesity related diagnoses. Carmen Garza is on the Category 2 Plan and states she is following her eating plan approximately 80% of the time. Frank states she is walking 10 minutes 3 times per week.  Today's visit was #: 5 Starting weight: 197 lbs Starting date: 05/13/2020 Today's weight: 179 lbs Today's date: 07/21/2020 Total lbs lost to date: 18 Total lbs lost since last in-office visit: 2  Interim History: Last few weeks, Carmen Garza has been getting our a bit more. Last night she did just bare chicken strips. She had a small indulgence of strawberry torte. She reports occasional hunger. She is getting between 6-8 oz of meat at dinner. Weekends are difficult in terms of remembering to eat.   Subjective:   1. Pre-diabetes Carmen Garza has an A1c of 5.8 and insulin level 17.7. She is not on medication.  2. Vitamin D deficiency Carmen Garza denies nausea, vomiting, and muscle weakness but notes fatigue. Pt is on prescription Vit D.  Assessment/Plan:   1. Pre-diabetes Carmen Garza will continue to work on weight loss, exercise, and decreasing simple carbohydrates to help decrease the risk of diabetes. Repeat labs in June 2022.  2. Vitamin D deficiency Low Vitamin D level contributes to fatigue and are associated with obesity, breast, and colon cancer. She agrees to continue to take prescription Vitamin D @50 ,000 IU every week and will follow-up for routine testing of Vitamin D, at least 2-3 times per year to avoid over-replacement.  3. Class 1 obesity with serious comorbidity and body mass index (BMI) of 34.0 to 34.9 in adult, unspecified obesity type Carmen Garza is currently in the action stage of change. As such, her goal is to continue with weight loss efforts. She has agreed to the Category 2 Plan + 100 calories.   Exercise goals: Pt is to start going back to the gym and implementing  resistance training.  Behavioral modification strategies: increasing lean protein intake, meal planning and cooking strategies, keeping healthy foods in the home and celebration eating strategies.  Carmen Garza has agreed to follow-up with our clinic in 2-3 weeks. She was informed of the importance of frequent follow-up visits to maximize her success with intensive lifestyle modifications for her multiple health conditions.   Objective:   Blood pressure 108/72, pulse 78, temperature (!) 97.5 F (36.4 C), height 5\' 2"  (1.575 m), weight 179 lb (81.2 kg), SpO2 97 %. Body mass index is 32.74 kg/m.  General: Cooperative, alert, well developed, in no acute distress. HEENT: Conjunctivae and lids unremarkable. Cardiovascular: Regular rhythm.  Lungs: Normal work of breathing. Neurologic: No focal deficits.   Lab Results  Component Value Date   CREATININE 0.56 (L) 05/13/2020   BUN 13 05/13/2020   NA 141 05/13/2020   K 4.3 05/13/2020   CL 101 05/13/2020   CO2 24 05/13/2020   Lab Results  Component Value Date   ALT 24 05/13/2020   AST 21 05/13/2020   ALKPHOS 64 05/13/2020   BILITOT 0.2 05/13/2020   Lab Results  Component Value Date   HGBA1C 5.8 (H) 05/13/2020   Lab Results  Component Value Date   INSULIN 17.7 05/13/2020   Lab Results  Component Value Date   TSH 1.110 05/13/2020   Lab Results  Component Value Date   CHOL 187 05/13/2020   HDL 64 05/13/2020   LDLCALC 103 (H) 05/13/2020   LDLDIRECT 143.6 07/22/2007  TRIG 113 05/13/2020   CHOLHDL 2.9 05/13/2020   Lab Results  Component Value Date   WBC 6.7 05/13/2020   HGB 14.1 05/13/2020   HCT 42.5 05/13/2020   MCV 90 05/13/2020   PLT 269 05/13/2020   Lab Results  Component Value Date   IRON 72 05/13/2020   TIBC 321 05/13/2020   FERRITIN 203 (H) 05/13/2020    Attestation Statements:   Reviewed by clinician on day of visit: allergies, medications, problem list, medical history, surgical history, family history,  social history, and previous encounter notes.  Time spent on visit including pre-visit chart review and post-visit care and charting was 13 minutes.   Coral Ceo, am acting as transcriptionist for Coralie Common, MD.  I have reviewed the above documentation for accuracy and completeness, and I agree with the above. - Jinny Blossom, MD

## 2020-08-04 ENCOUNTER — Other Ambulatory Visit: Payer: Self-pay

## 2020-08-04 ENCOUNTER — Ambulatory Visit (INDEPENDENT_AMBULATORY_CARE_PROVIDER_SITE_OTHER): Payer: BC Managed Care – PPO | Admitting: Family Medicine

## 2020-08-04 ENCOUNTER — Encounter (INDEPENDENT_AMBULATORY_CARE_PROVIDER_SITE_OTHER): Payer: Self-pay | Admitting: Family Medicine

## 2020-08-04 VITALS — BP 114/75 | HR 68 | Temp 97.6°F | Ht 62.0 in | Wt 177.0 lb

## 2020-08-04 DIAGNOSIS — E559 Vitamin D deficiency, unspecified: Secondary | ICD-10-CM | POA: Diagnosis not present

## 2020-08-04 DIAGNOSIS — Z6836 Body mass index (BMI) 36.0-36.9, adult: Secondary | ICD-10-CM

## 2020-08-04 DIAGNOSIS — Z9189 Other specified personal risk factors, not elsewhere classified: Secondary | ICD-10-CM | POA: Diagnosis not present

## 2020-08-04 DIAGNOSIS — K5900 Constipation, unspecified: Secondary | ICD-10-CM

## 2020-08-04 MED ORDER — VITAMIN D (ERGOCALCIFEROL) 1.25 MG (50000 UNIT) PO CAPS: 50000.0000 [IU] | ORAL_CAPSULE | ORAL | 0 refills | Status: DC

## 2020-08-05 NOTE — Progress Notes (Signed)
Chief Complaint:   OBESITY Carmen Garza is here to discuss her progress with her obesity treatment plan along with follow-up of her obesity related diagnoses. Carmen Garza is on the Category 2 Plan + 100 calories and states she is following her eating plan approximately 80% of the time. Carmen Garza states she is walking for 30 minutes 1 time per week.  Today's visit was #: 6 Starting weight: 197 lbs Starting date: 05/13/2020 Today's weight: 177 lbs Today's date: 08/04/2020 Total lbs lost to date: 20 Total lbs lost since last in-office visit: 2  Interim History: Carmen Garza's hunger is well satisfied. She is mostly able to get all the food in. She did have a few off plan meals. She feels she needs to drink more water. She went to the gym last night for the first time in a while.  Subjective:   1. Vitamin D deficiency Tomeshia's Vit D low at 29.2 on 05/13/2020. She is on weekly prescription Vit D. She has a family history of osteoporosis.  2. Constipation, unspecified constipation type Carmen Garza had been using metamucil but she notes cramping.  3. At risk for osteoporosis Carmen Garza is at higher risk of osteopenia and osteoporosis due to Vitamin D deficiency and family history of osteoporosis.   Assessment/Plan:   1. Vitamin D deficiency . We will refill prescription Vitamin D for 1 month. Carmen Garza will follow-up for routine testing of Vitamin D, at least 2-3 times per year to avoid over-replacement.  - Vitamin D, Ergocalciferol, (DRISDOL) 1.25 MG (50000 UNIT) CAPS capsule; Take 1 capsule (50,000 Units total) by mouth every 7 (seven) days.  Dispense: 4 capsule; Refill: 0  2. Constipation, unspecified constipation type Carmen Garza was informed that a decrease in bowel movement frequency is normal while losing weight, but stools should not be hard or painful. She will try miralax up to every day if needed and increase water and increase vegetables.   3. At risk for osteoporosis Carmen Garza was given approximately 15  minutes of osteoporosis prevention counseling today. Carmen Garza is at risk for osteopenia and osteoporosis due to her Vitamin D deficiency. She was encouraged to take her Vitamin D and follow her higher calcium diet and increase strengthening exercise to help strengthen her bones and decrease her risk of osteopenia and osteoporosis.  Repetitive spaced learning was employed today to elicit superior memory formation and behavioral change.  4. Obesity: Current BMI 32.37 Carmen Garza is currently in the action stage of change. As such, her goal is to continue with weight loss efforts. She has agreed to the Category 2 Plan.   Exercise goals: As is. We discussed doing circuit training at [employer] 2-3 times per week.  Behavioral modification strategies: increasing water intake and travel eating strategies.  Carmen Garza has agreed to follow-up with our clinic in 4 weeks.  Objective:   Blood pressure 114/75, pulse 68, temperature 97.6 F (36.4 C), height 5\' 2"  (1.575 m), weight 177 lb (80.3 kg), SpO2 96 %. Body mass index is 32.37 kg/m.  General: Cooperative, alert, well developed, in no acute distress. HEENT: Conjunctivae and lids unremarkable. Cardiovascular: Regular rhythm.  Lungs: Normal work of breathing. Neurologic: No focal deficits.   Lab Results  Component Value Date   CREATININE 0.56 (L) 05/13/2020   BUN 13 05/13/2020   NA 141 05/13/2020   K 4.3 05/13/2020   CL 101 05/13/2020   CO2 24 05/13/2020   Lab Results  Component Value Date   ALT 24 05/13/2020   AST 21 05/13/2020  ALKPHOS 64 05/13/2020   BILITOT 0.2 05/13/2020   Lab Results  Component Value Date   HGBA1C 5.8 (H) 05/13/2020   Lab Results  Component Value Date   INSULIN 17.7 05/13/2020   Lab Results  Component Value Date   TSH 1.110 05/13/2020   Lab Results  Component Value Date   CHOL 187 05/13/2020   HDL 64 05/13/2020   LDLCALC 103 (H) 05/13/2020   LDLDIRECT 143.6 07/22/2007   TRIG 113 05/13/2020   CHOLHDL  2.9 05/13/2020   Lab Results  Component Value Date   WBC 6.7 05/13/2020   HGB 14.1 05/13/2020   HCT 42.5 05/13/2020   MCV 90 05/13/2020   PLT 269 05/13/2020   Lab Results  Component Value Date   IRON 72 05/13/2020   TIBC 321 05/13/2020   FERRITIN 203 (H) 05/13/2020   Attestation Statements:   Reviewed by clinician on day of visit: allergies, medications, problem list, medical history, surgical history, family history, social history, and previous encounter notes.   Wilhemena Durie, am acting as Location manager for [employer], FNP-C.  I have reviewed the above documentation for accuracy and completeness, and I agree with the above. -  Georgianne Fick, FNP

## 2020-08-10 ENCOUNTER — Encounter (INDEPENDENT_AMBULATORY_CARE_PROVIDER_SITE_OTHER): Payer: Self-pay | Admitting: Family Medicine

## 2020-08-19 ENCOUNTER — Other Ambulatory Visit (INDEPENDENT_AMBULATORY_CARE_PROVIDER_SITE_OTHER): Payer: Self-pay | Admitting: Family Medicine

## 2020-08-19 DIAGNOSIS — E559 Vitamin D deficiency, unspecified: Secondary | ICD-10-CM

## 2020-08-19 NOTE — Telephone Encounter (Signed)
Last seen Dawn 

## 2020-09-01 ENCOUNTER — Encounter (INDEPENDENT_AMBULATORY_CARE_PROVIDER_SITE_OTHER): Payer: Self-pay | Admitting: Family Medicine

## 2020-09-01 ENCOUNTER — Ambulatory Visit (INDEPENDENT_AMBULATORY_CARE_PROVIDER_SITE_OTHER): Payer: BC Managed Care – PPO | Admitting: Family Medicine

## 2020-09-01 ENCOUNTER — Other Ambulatory Visit: Payer: Self-pay

## 2020-09-01 VITALS — BP 107/72 | HR 67 | Temp 97.9°F | Ht 62.0 in | Wt 173.0 lb

## 2020-09-01 DIAGNOSIS — Z6836 Body mass index (BMI) 36.0-36.9, adult: Secondary | ICD-10-CM

## 2020-09-01 DIAGNOSIS — E559 Vitamin D deficiency, unspecified: Secondary | ICD-10-CM | POA: Diagnosis not present

## 2020-09-01 DIAGNOSIS — R7303 Prediabetes: Secondary | ICD-10-CM | POA: Diagnosis not present

## 2020-09-01 MED ORDER — VITAMIN D (ERGOCALCIFEROL) 1.25 MG (50000 UNIT) PO CAPS: 50000.0000 [IU] | ORAL_CAPSULE | ORAL | 0 refills | Status: DC

## 2020-09-02 NOTE — Progress Notes (Signed)
Chief Complaint:   OBESITY Carmen Garza is here to discuss her progress with her obesity treatment plan along with follow-up of her obesity related diagnoses. Carmen Garza is on the Category 2 Plan and states she is following her eating plan approximately 75% of the time. Carmen Garza states she is walking and strengthening for 30-40 minutes 3-4 times per week.  Today's visit was #: 7 Starting weight: 197 lbs Starting date: 05/13/2020 Today's weight: 173 lbs Today's date: 09/01/2020 Total lbs lost to date: 24 Total lbs lost since last in-office visit: 4  Interim History: Carmen Garza is quite happy with her progress. She is down 24 lbs. She notes some cravings in the evenings. She does give in occasionally. She is doing well with protein intake. She is working on increasing her water intake. Her goal weight is 135 lbs (24 BMI).  Subjective:   1. Vitamin D deficiency Carmen Garza's Vit D level is not at goal at 29.2 at last check. She is on weekly prescription Vit D.  2. Pre-diabetes Carmen Garza's last A1c was 5.8. She is not on metformin.   Lab Results  Component Value Date   HGBA1C 5.8 (H) 05/13/2020   Lab Results  Component Value Date   INSULIN 17.7 05/13/2020   Assessment/Plan:   1. Vitamin D deficiency  We will refill prescription Vitamin D for 1 month. Carmen Garza will follow-up for routine testing of Vitamin D, at least 2-3 times per year to avoid over-replacement.  - Vitamin D, Ergocalciferol, (DRISDOL) 1.25 MG (50000 UNIT) CAPS capsule; Take 1 capsule (50,000 Units total) by mouth every 7 (seven) days.  Dispense: 4 capsule; Refill: 0  2. Pre-diabetes  We will recheck labs in 6 weeks.  3. Obesity: Current BMI 31.63 Carmen Garza is currently in the action stage of change. As such, her goal is to continue with weight loss efforts. She has agreed to the Category 2 Plan + 100 calories.   We will recheck her fasting labs in 6 weeks.  Exercise goals: As is.  Behavioral modification strategies: increasing  water intake.  Carmen Garza has agreed to follow-up with our clinic in 3 weeks with Carmen Marble, NP, and in 6 weeks with myself.   Objective:   Blood pressure 107/72, pulse 67, temperature 97.9 F (36.6 C), temperature source Oral, height 5\' 2"  (1.575 m), weight 173 lb (78.5 kg), SpO2 97 %. Body mass index is 31.64 kg/m.  General: Cooperative, alert, well developed, in no acute distress. HEENT: Conjunctivae and lids unremarkable. Cardiovascular: Regular rhythm.  Lungs: Normal work of breathing. Neurologic: No focal deficits.   Lab Results  Component Value Date   CREATININE 0.56 (L) 05/13/2020   BUN 13 05/13/2020   NA 141 05/13/2020   K 4.3 05/13/2020   CL 101 05/13/2020   CO2 24 05/13/2020   Lab Results  Component Value Date   ALT 24 05/13/2020   AST 21 05/13/2020   ALKPHOS 64 05/13/2020   BILITOT 0.2 05/13/2020   Lab Results  Component Value Date   HGBA1C 5.8 (H) 05/13/2020   Lab Results  Component Value Date   INSULIN 17.7 05/13/2020   Lab Results  Component Value Date   TSH 1.110 05/13/2020   Lab Results  Component Value Date   CHOL 187 05/13/2020   HDL 64 05/13/2020   LDLCALC 103 (H) 05/13/2020   LDLDIRECT 143.6 07/22/2007   TRIG 113 05/13/2020   CHOLHDL 2.9 05/13/2020   Lab Results  Component Value Date   WBC 6.7 05/13/2020  HGB 14.1 05/13/2020   HCT 42.5 05/13/2020   MCV 90 05/13/2020   PLT 269 05/13/2020   Lab Results  Component Value Date   IRON 72 05/13/2020   TIBC 321 05/13/2020   FERRITIN 203 (H) 05/13/2020   Attestation Statements:   Reviewed by clinician on day of visit: allergies, medications, problem list, medical history, surgical history, family history, social history, and previous encounter notes.   Carmen Garza, am acting as Location manager for [employer], FNP-C.  I have reviewed the above documentation for accuracy and completeness, and I agree with the above. -  Carmen Fick, FNP

## 2020-09-03 ENCOUNTER — Inpatient Hospital Stay: Admission: RE | Admit: 2020-09-03 | Payer: BC Managed Care – PPO | Source: Ambulatory Visit

## 2020-09-07 ENCOUNTER — Encounter (INDEPENDENT_AMBULATORY_CARE_PROVIDER_SITE_OTHER): Payer: Self-pay | Admitting: Family Medicine

## 2020-09-23 ENCOUNTER — Encounter (INDEPENDENT_AMBULATORY_CARE_PROVIDER_SITE_OTHER): Payer: Self-pay | Admitting: Adult Health

## 2020-09-23 ENCOUNTER — Other Ambulatory Visit: Payer: Self-pay

## 2020-09-23 ENCOUNTER — Ambulatory Visit (INDEPENDENT_AMBULATORY_CARE_PROVIDER_SITE_OTHER): Payer: BC Managed Care – PPO | Admitting: Adult Health

## 2020-09-23 VITALS — BP 101/70 | HR 86 | Temp 98.1°F | Ht 62.0 in | Wt 169.0 lb

## 2020-09-23 DIAGNOSIS — Z9189 Other specified personal risk factors, not elsewhere classified: Secondary | ICD-10-CM

## 2020-09-23 DIAGNOSIS — R7303 Prediabetes: Secondary | ICD-10-CM

## 2020-09-23 DIAGNOSIS — Z6836 Body mass index (BMI) 36.0-36.9, adult: Secondary | ICD-10-CM

## 2020-09-23 DIAGNOSIS — E559 Vitamin D deficiency, unspecified: Secondary | ICD-10-CM | POA: Diagnosis not present

## 2020-09-23 MED ORDER — VITAMIN D (ERGOCALCIFEROL) 1.25 MG (50000 UNIT) PO CAPS: 50000.0000 [IU] | ORAL_CAPSULE | ORAL | 0 refills | Status: DC

## 2020-09-27 NOTE — Progress Notes (Signed)
Chief Complaint:   OBESITY Carmen Garza is here to discuss her progress with her obesity treatment plan along with follow-up of her obesity related diagnoses. Carmen Garza is on the Category 2 Plan + 100 cal and states she is following her eating plan approximately 80% of the time. Carmen Garza states she is walking and weight training 30 minutes 3-4 times per week.  Today's visit was #: 8 Starting weight: 197 lbs Starting date: 05/13/2020 Today's weight: 169 lbs Today's date: 09/23/2020 Total lbs lost to date: 28 Total lbs lost since last in-office visit: 4  Interim History: Carmen Garza has been really focused on keeping snack calories to <300/day. She enjoys "salty snacks." Next week she will travel to [place] for family reunion. Discussed travel strategies.  Subjective:   1. Vitamin D deficiency Carmen Garza's Vitamin D level was 29.2 on 05/13/2020- well below goal of 50. She is currently taking prescription vitamin D 50,000 IU each week. She denies nausea, vomiting or muscle weakness.  2. Pre-diabetes Carmen Garza's maternal grandmother had type 2 diabetes. 05/13/2020 A1c 5.8 with elevated insulin 17.7. She is not on Metformin. She denies polyphagia.  Lab Results  Component Value Date   HGBA1C 5.8 (H) 05/13/2020   Lab Results  Component Value Date   INSULIN 17.7 05/13/2020   3. At risk for diabetes mellitus Carmen Garza is at higher than average risk for developing diabetes due to obesity and pre-diabetes.   Assessment/Plan:   1. Vitamin D deficiency Low Vitamin D level contributes to fatigue and are associated with obesity, breast, and colon cancer. She agrees to continue to take prescription Vitamin D @50 ,000 IU every week and will follow-up for routine testing of Vitamin D, at least 2-3 times per year to avoid over-replacement. -Check fasting labs at next OV.  - Vitamin D, Ergocalciferol, (DRISDOL) 1.25 MG (50000 UNIT) CAPS capsule; Take 1 capsule (50,000 Units total) by mouth every 7 (seven) days.  Dispense: 4  capsule; Refill: 0  2. Pre-diabetes Mayrani will continue to work on weight loss, exercise, and decreasing simple carbohydrates to help decrease the risk of diabetes.  -Check fasting labs at next OV.  3. At risk for diabetes mellitus Carmen Garza was given approximately 15 minutes of diabetes education and counseling today. We discussed intensive lifestyle modifications today with an emphasis on weight loss as well as increasing exercise and decreasing simple carbohydrates in her diet. We also reviewed medication options with an emphasis on risk versus benefit of those discussed.   Repetitive spaced learning was employed today to elicit superior memory formation and behavioral change.  4. Obesity: Current BMI 31.1  Carmen Garza is currently in the action stage of change. As such, her goal is to continue with weight loss efforts. She has agreed to the Category 2 Plan + 100 calories.   -Check fasting labs at next OV. Handout: Freight forward[employer]  Exercise goals:  As is  Behavioral modification strategies: increasing lean protein intake, decreasing simple carbohydrates, increasing water intake, meal planning and cooking strategies, keeping healthy foods in the home, travel eating strategies, and planning for success.  Carmen Garza has agreed to follow-up with our clinic in 3 weeks- fasting with Dawn. She was informed of the importance of frequent follow-up visits to maximize her success with intensive lifestyle modifications for her multiple health conditions.   Objective:   Blood pressure 101/70, pulse 86, temperature 98.1 F (36.7 C), height 5\' 2"  (1.575 m), weight 169 lb (76.7 kg), SpO2 97 %. Body mass index is 30.91 kg/m.  General: Cooperative, alert, well developed, in no acute distress. HEENT: Conjunctivae and lids unremarkable. Cardiovascular: Regular rhythm.  Lungs: Normal work of breathing. Neurologic: No focal deficits.   Lab Results  Component Value Date   CREATININE 0.56 (L) 05/13/2020   BUN  13 05/13/2020   NA 141 05/13/2020   K 4.3 05/13/2020   CL 101 05/13/2020   CO2 24 05/13/2020   Lab Results  Component Value Date   ALT 24 05/13/2020   AST 21 05/13/2020   ALKPHOS 64 05/13/2020   BILITOT 0.2 05/13/2020   Lab Results  Component Value Date   HGBA1C 5.8 (H) 05/13/2020   Lab Results  Component Value Date   INSULIN 17.7 05/13/2020   Lab Results  Component Value Date   TSH 1.110 05/13/2020   Lab Results  Component Value Date   CHOL 187 05/13/2020   HDL 64 05/13/2020   LDLCALC 103 (H) 05/13/2020   LDLDIRECT 143.6 07/22/2007   TRIG 113 05/13/2020   CHOLHDL 2.9 05/13/2020   Lab Results  Component Value Date   WBC 6.7 05/13/2020   HGB 14.1 05/13/2020   HCT 42.5 05/13/2020   MCV 90 05/13/2020   PLT 269 05/13/2020   Lab Results  Component Value Date   IRON 72 05/13/2020   TIBC 321 05/13/2020   FERRITIN 203 (H) 05/13/2020    Attestation Statements:   Reviewed by clinician on day of visit: allergies, medications, problem list, medical history, surgical history, family history, social history, and previous encounter notes.  Coral Ceo, CMA, am acting as transcriptionist for Mina Marble, NP.  I have reviewed the above documentation for accuracy and completeness, and I agree with the above. -  Aviya Jarvie d. Kaitlinn Iversen, NP-C

## 2020-10-13 ENCOUNTER — Other Ambulatory Visit: Payer: Self-pay

## 2020-10-13 ENCOUNTER — Ambulatory Visit (INDEPENDENT_AMBULATORY_CARE_PROVIDER_SITE_OTHER): Payer: BC Managed Care – PPO | Admitting: Family Medicine

## 2020-10-13 ENCOUNTER — Encounter (INDEPENDENT_AMBULATORY_CARE_PROVIDER_SITE_OTHER): Payer: Self-pay | Admitting: Family Medicine

## 2020-10-13 VITALS — BP 93/63 | HR 74 | Temp 97.7°F | Ht 62.0 in | Wt 167.0 lb

## 2020-10-13 DIAGNOSIS — E7849 Other hyperlipidemia: Secondary | ICD-10-CM | POA: Diagnosis not present

## 2020-10-13 DIAGNOSIS — Z6836 Body mass index (BMI) 36.0-36.9, adult: Secondary | ICD-10-CM

## 2020-10-13 DIAGNOSIS — Z9189 Other specified personal risk factors, not elsewhere classified: Secondary | ICD-10-CM

## 2020-10-13 DIAGNOSIS — R7303 Prediabetes: Secondary | ICD-10-CM

## 2020-10-13 DIAGNOSIS — E559 Vitamin D deficiency, unspecified: Secondary | ICD-10-CM | POA: Diagnosis not present

## 2020-10-13 DIAGNOSIS — E7800 Pure hypercholesterolemia, unspecified: Secondary | ICD-10-CM | POA: Diagnosis not present

## 2020-10-13 MED ORDER — VITAMIN D (ERGOCALCIFEROL) 1.25 MG (50000 UNIT) PO CAPS: 50000.0000 [IU] | ORAL_CAPSULE | ORAL | 0 refills | Status: DC

## 2020-10-14 LAB — COMPREHENSIVE METABOLIC PANEL
ALT: 24 IU/L (ref 0–32)
AST: 16 IU/L (ref 0–40)
Albumin/Globulin Ratio: 1.9 (ref 1.2–2.2)
Albumin: 4.6 g/dL (ref 3.8–4.9)
Alkaline Phosphatase: 84 IU/L (ref 44–121)
BUN/Creatinine Ratio: 24 — ABNORMAL HIGH (ref 9–23)
BUN: 12 mg/dL (ref 6–24)
Bilirubin Total: 0.4 mg/dL (ref 0.0–1.2)
CO2: 23 mmol/L (ref 20–29)
Calcium: 9.3 mg/dL (ref 8.7–10.2)
Chloride: 106 mmol/L (ref 96–106)
Creatinine, Ser: 0.51 mg/dL — ABNORMAL LOW (ref 0.57–1.00)
Globulin, Total: 2.4 g/dL (ref 1.5–4.5)
Glucose: 95 mg/dL (ref 65–99)
Potassium: 4.8 mmol/L (ref 3.5–5.2)
Sodium: 143 mmol/L (ref 134–144)
Total Protein: 7 g/dL (ref 6.0–8.5)
eGFR: 111 mL/min/{1.73_m2} (ref >59)

## 2020-10-14 LAB — LIPID PANEL WITH LDL/HDL RATIO
Cholesterol, Total: 180 mg/dL (ref 100–199)
HDL: 59 mg/dL (ref >39)
LDL Chol Calc (NIH): 102 mg/dL — ABNORMAL HIGH (ref 0–99)
LDL/HDL Ratio: 1.7 ratio (ref 0.0–3.2)
Triglycerides: 106 mg/dL (ref 0–149)
VLDL Cholesterol Cal: 19 mg/dL (ref 5–40)

## 2020-10-14 LAB — VITAMIN D 25 HYDROXY (VIT D DEFICIENCY, FRACTURES): Vit D, 25-Hydroxy: 77.9 ng/mL (ref 30.0–100.0)

## 2020-10-14 LAB — HEMOGLOBIN A1C
Est. average glucose Bld gHb Est-mCnc: 111 mg/dL
Hgb A1c MFr Bld: 5.5 % (ref 4.8–5.6)

## 2020-10-14 LAB — INSULIN, RANDOM: INSULIN: 9.4 u[IU]/mL (ref 2.6–24.9)

## 2020-10-19 NOTE — Progress Notes (Signed)
Chief Complaint:   OBESITY Carmen Garza is here to discuss her progress with her obesity treatment plan along with follow-up of her obesity related diagnoses. Carmen Garza is on the Category 2 Plan + 100 calories and states she is following her eating plan approximately 50% of the time. Carmen Garza states she is walking and doing weights for 30-40 minutes 3-4 times per week.  Today's visit was #: 9 Starting weight: 197 lbs Starting date: 05/13/2020 Today's weight: 167 lbs Today's date: 10/13/2020 Total lbs lost to date: 30 Total lbs lost since last in-office visit: 2  Interim History: Carmen Garza went to her family reunion in [place], but still lost 2 lbs. She feels she is getting in all of the prescribed protein. Her water intake is good. Her hunger is satisfied. She is mindful of keeping snacks <300 cal/day.  Subjective:   1. Pre-diabetes Carmen Garza is not on medications. Last A1c was 5.8.  Lab Results  Component Value Date   HGBA1C 5.5 10/13/2020   Lab Results  Component Value Date   INSULIN 9.4 10/13/2020   INSULIN 17.7 05/13/2020   2. Vitamin D deficiency Carmen Garza's last Vit D level was low at 29.2. She is on weekly prescription Vit D.  Lab Results  Component Value Date   VD25OH 77.9 10/13/2020   VD25OH 29.2 (L) 05/13/2020   3. Other hyperlipidemia Carmen Garza's last LDL was slightly elevated at 103. Her triglycerides and HDL were within normal limits. She is not on statin.   Lab Results  Component Value Date   ALT 24 10/13/2020   AST 16 10/13/2020   ALKPHOS 84 10/13/2020   BILITOT 0.4 10/13/2020   Lab Results  Component Value Date   CHOL 180 10/13/2020   HDL 59 10/13/2020   LDLCALC 102 (H) 10/13/2020   LDLDIRECT 143.6 07/22/2007   TRIG 106 10/13/2020   CHOLHDL 2.9 05/13/2020  The 10-year ASCVD risk score Mikey Bussing DC Jr., et al., 2013) is: 0.8%   Values used to calculate the score:     Age: 55 years     Sex: Female     Is Non-Hispanic African American: No     Diabetic: No     Tobacco  smoker: No     Systolic Blood Pressure: 93 mmHg     Is BP treated: No     HDL Cholesterol: 59 mg/dL     Total Cholesterol: 180 mg/dL  4. At risk for diabetes mellitus Carmen Garza is at higher than average risk for developing diabetes due to pre-diabetes, and family history of diabetes mellitus.   Assessment/Plan:   1. Pre-diabetes We will check labs today.  - Comprehensive metabolic panel - Hemoglobin A1c - Insulin, random  2. Vitamin D deficiency We will check labs today, and we will refill prescription Vitamin D 50,000 IU every week for 1 month. Carmen Garza will follow-up for routine testing of Vitamin D, at least 2-3 times per year to avoid over-replacement.  - VITAMIN D 25 Hydroxy (Vit-D Deficiency, Fractures) - Vitamin D, Ergocalciferol, (DRISDOL) 1.25 MG (50000 UNIT) CAPS capsule; Take 1 capsule (50,000 Units total) by mouth every 7 (seven) days.  Dispense: 4 capsule; Refill: 0  3. Other hyperlipidemia No statin indicated currently. We will check labs today.   - Lipid Panel With LDL/HDL Ratio  4. At risk for diabetes mellitus Carmen Garza was given approximately 15 minutes of diabetes education and counseling today. We discussed intensive lifestyle modifications today with an emphasis on weight loss as well as increasing exercise and  decreasing simple carbohydrates in her diet. We also reviewed medication options with an emphasis on risk versus benefit of those discussed.   Repetitive spaced learning was employed today to elicit superior memory formation and behavioral change.  5. Obesity: Current BMI 30.54 Carmen Garza is currently in the action stage of change. As such, her goal is to continue with weight loss efforts. She has agreed to the Category 2 Plan + 100 calories.   Exercise goals: As is.  Behavioral modification strategies: increasing water intake.  Carmen Garza has agreed to follow-up with our clinic in 3 weeks.  Carmen Garza was informed we would discuss her lab results at her next visit  unless there is a critical issue that needs to be addressed sooner. Carmen Garza agreed to keep her next visit at the agreed upon time to discuss these results.  Objective:   Blood pressure 93/63, pulse 74, temperature 97.7 F (36.5 C), height 5\' 2"  (1.575 m), weight 167 lb (75.8 kg), SpO2 99 %. Body mass index is 30.54 kg/m.  General: Cooperative, alert, well developed, in no acute distress. HEENT: Conjunctivae and lids unremarkable. Cardiovascular: Regular rhythm.  Lungs: Normal work of breathing. Neurologic: No focal deficits.   Lab Results  Component Value Date   CREATININE 0.51 (L) 10/13/2020   BUN 12 10/13/2020   NA 143 10/13/2020   K 4.8 10/13/2020   CL 106 10/13/2020   CO2 23 10/13/2020   Lab Results  Component Value Date   ALT 24 10/13/2020   AST 16 10/13/2020   ALKPHOS 84 10/13/2020   BILITOT 0.4 10/13/2020   Lab Results  Component Value Date   HGBA1C 5.5 10/13/2020   HGBA1C 5.8 (H) 05/13/2020   Lab Results  Component Value Date   INSULIN 9.4 10/13/2020   INSULIN 17.7 05/13/2020   Lab Results  Component Value Date   TSH 1.110 05/13/2020   Lab Results  Component Value Date   CHOL 180 10/13/2020   HDL 59 10/13/2020   LDLCALC 102 (H) 10/13/2020   LDLDIRECT 143.6 07/22/2007   TRIG 106 10/13/2020   CHOLHDL 2.9 05/13/2020   Lab Results  Component Value Date   VD25OH 77.9 10/13/2020   VD25OH 29.2 (L) 05/13/2020   Lab Results  Component Value Date   WBC 6.7 05/13/2020   HGB 14.1 05/13/2020   HCT 42.5 05/13/2020   MCV 90 05/13/2020   PLT 269 05/13/2020   Lab Results  Component Value Date   IRON 72 05/13/2020   TIBC 321 05/13/2020   FERRITIN 203 (H) 05/13/2020   Attestation Statements:   Reviewed by clinician on day of visit: allergies, medications, problem list, medical history, surgical history, family history, social history, and previous encounter notes.   Wilhemena Durie, am acting as Location manager for [employer], FNP-C.  I have  reviewed the above documentation for accuracy and completeness, and I agree with the above. -  Georgianne Fick, FNP

## 2020-11-02 DIAGNOSIS — Z20822 Contact with and (suspected) exposure to covid-19: Secondary | ICD-10-CM | POA: Diagnosis not present

## 2020-11-03 ENCOUNTER — Ambulatory Visit (INDEPENDENT_AMBULATORY_CARE_PROVIDER_SITE_OTHER): Payer: BC Managed Care – PPO | Admitting: Family Medicine

## 2020-11-03 ENCOUNTER — Encounter (INDEPENDENT_AMBULATORY_CARE_PROVIDER_SITE_OTHER): Payer: Self-pay | Admitting: Family Medicine

## 2020-11-03 ENCOUNTER — Other Ambulatory Visit: Payer: Self-pay

## 2020-11-03 VITALS — BP 125/81 | HR 80 | Temp 98.0°F | Ht 62.0 in | Wt 167.0 lb

## 2020-11-03 DIAGNOSIS — E7849 Other hyperlipidemia: Secondary | ICD-10-CM | POA: Diagnosis not present

## 2020-11-03 DIAGNOSIS — Z6836 Body mass index (BMI) 36.0-36.9, adult: Secondary | ICD-10-CM

## 2020-11-03 DIAGNOSIS — E559 Vitamin D deficiency, unspecified: Secondary | ICD-10-CM

## 2020-11-03 NOTE — Progress Notes (Signed)
The 10-year ASCVD risk score Mikey Bussing DC Brooke Bonito., et al., 2013) is: 1.4%   Values used to calculate the score:     Age: 55 years     Sex: Female     Is Non-Hispanic African American: No     Diabetic: No     Tobacco smoker: No     Systolic Blood Pressure: 0000000 mmHg     Is BP treated: No     HDL Cholesterol: 59 mg/dL     Total Cholesterol: 180 mg/dL

## 2020-11-04 ENCOUNTER — Ambulatory Visit
Admission: RE | Admit: 2020-11-04 | Discharge: 2020-11-04 | Disposition: A | Discharge status: Home / Self Care | Payer: BC Managed Care – PPO | Source: Ambulatory Visit | Attending: Family Medicine | Admitting: Family Medicine

## 2020-11-04 DIAGNOSIS — Z780 Asymptomatic menopausal state: Secondary | ICD-10-CM | POA: Diagnosis not present

## 2020-11-04 DIAGNOSIS — M810 Age-related osteoporosis without current pathological fracture: Secondary | ICD-10-CM | POA: Diagnosis not present

## 2020-11-04 DIAGNOSIS — M816 Localized osteoporosis [Lequesne]: Secondary | ICD-10-CM

## 2020-11-04 DIAGNOSIS — M8588 Other specified disorders of bone density and structure, other site: Secondary | ICD-10-CM | POA: Diagnosis not present

## 2020-11-09 MED ORDER — VITAMIN D 125 MCG (5000 UT) PO CAPS: 1.0000 | ORAL_CAPSULE | Freq: Every day | ORAL | 0 refills | Status: DC

## 2020-11-09 NOTE — Progress Notes (Signed)
Chief Complaint:   OBESITY Carmen Garza is here to discuss her progress with her obesity treatment plan along with follow-up of her obesity related diagnoses. Carmen Garza is on the Category 2 Plan + 100 calories and states she is following her eating plan approximately 75% of the time. Carmen Garza states she is doing cardio and weights for 30 minutes 3-4 times per week.  Today's visit was #: 10 Starting weight: 197 lbs Starting date: 05/13/2020 Today's weight: 167 lbs Today's date: 87/27/2022 Total lbs lost to date: 30 Total lbs lost since last in-office visit: 0  Interim History: Carmen Garza's mother is staying with her and she likes to buy snacks which are causing [name] to snack more. Carmen Garza is eating all of her meals on the plan but exceeding snack calories. Her mom will be with her through September. Hunger is not an issue. She says she needs to increase her water intake. She gets between 32-48 oz of water.  Subjective:   1. Other hyperlipidemia Carmen Garza's last LDL was slightly elevated at 102, and her HDL and triglycerides were within normal limits. She is not on statin, and her 10 year ASCVD risk score is 1.4%.  Lab Results  Component Value Date   ALT 24 10/13/2020   AST 16 10/13/2020   ALKPHOS 84 10/13/2020   BILITOT 0.4 10/13/2020   Lab Results  Component Value Date   CHOL 180 10/13/2020   HDL 59 10/13/2020   LDLCALC 102 (H) 10/13/2020   LDLDIRECT 143.6 07/22/2007   TRIG 106 10/13/2020   CHOLHDL 2.9 05/13/2020   2. Vitamin D deficiency Carmen Garza's last Vit D level was at goal at 77.9. She is on prescription weekly Vit D.  Lab Results  Component Value Date   VD25OH 77.9 10/13/2020   VD25OH 29.2 (L) 05/13/2020   Assessment/Plan:   1. Other hyperlipidemia Cardiovascular risk and specific lipid/LDL goals reviewed. We discussed several lifestyle modifications today. Carmen Garza will continue with weight loss efforts. No statin indicated.   2. Vitamin D deficiency Carmen Garza will finish  prescription Vitamin D, and will start 5,000 OTC Vit D. She will follow-up for routine testing of Vitamin D, at least 2-3 times per year to avoid over-replacement.  3. Obesity: Current BMI 30.54 Carmen Garza is currently in the action stage of change. As such, her goal is to continue with weight loss efforts. She has agreed to the Category 2 Plan + 100 calories.   Exercise goals: As is.  Behavioral modification strategies: increasing water intake, ways to avoid boredom eating, and ways to avoid night time snacking.  Carmen Garza has agreed to follow-up with our clinic in 3 weeks.  Objective:   Blood pressure 125/81, pulse 80, temperature 98 F (36.7 C), height '5\' 2"'$  (1.575 m), weight 167 lb (75.8 kg), SpO2 98 %. Body mass index is 30.54 kg/m.  General: Cooperative, alert, well developed, in no acute distress. HEENT: Conjunctivae and lids unremarkable. Cardiovascular: Regular rhythm.  Lungs: Normal work of breathing. Neurologic: No focal deficits.   Lab Results  Component Value Date   CREATININE 0.51 (L) 10/13/2020   BUN 12 10/13/2020   NA 143 10/13/2020   K 4.8 10/13/2020   CL 106 10/13/2020   CO2 23 10/13/2020   Lab Results  Component Value Date   ALT 24 10/13/2020   AST 16 10/13/2020   ALKPHOS 84 10/13/2020   BILITOT 0.4 10/13/2020   Lab Results  Component Value Date   HGBA1C 5.5 10/13/2020   HGBA1C 5.8 (H) 05/13/2020  Lab Results  Component Value Date   INSULIN 9.4 10/13/2020   INSULIN 17.7 05/13/2020   Lab Results  Component Value Date   TSH 1.110 05/13/2020   Lab Results  Component Value Date   CHOL 180 10/13/2020   HDL 59 10/13/2020   LDLCALC 102 (H) 10/13/2020   LDLDIRECT 143.6 07/22/2007   TRIG 106 10/13/2020   CHOLHDL 2.9 05/13/2020   Lab Results  Component Value Date   VD25OH 77.9 10/13/2020   VD25OH 29.2 (L) 05/13/2020   Lab Results  Component Value Date   WBC 6.7 05/13/2020   HGB 14.1 05/13/2020   HCT 42.5 05/13/2020   MCV 90 05/13/2020   PLT  269 05/13/2020   Lab Results  Component Value Date   IRON 72 05/13/2020   TIBC 321 05/13/2020   FERRITIN 203 (H) 05/13/2020    Attestation Statements:   Reviewed by clinician on day of visit: allergies, medications, problem list, medical history, surgical history, family history, social history, and previous encounter notes.  Time spent on visit including pre-visit chart review and post-visit care and charting was 33 minutes.    Wilhemena Durie, am acting as Location manager for [employer], FNP-C.  I have reviewed the above documentation for accuracy and completeness, and I agree with the above. -  Georgianne Fick, FNP

## 2020-11-24 ENCOUNTER — Encounter (INDEPENDENT_AMBULATORY_CARE_PROVIDER_SITE_OTHER): Payer: Self-pay | Admitting: Family Medicine

## 2020-11-24 ENCOUNTER — Other Ambulatory Visit: Payer: Self-pay

## 2020-11-24 ENCOUNTER — Ambulatory Visit (INDEPENDENT_AMBULATORY_CARE_PROVIDER_SITE_OTHER): Payer: BC Managed Care – PPO | Admitting: Family Medicine

## 2020-11-24 VITALS — BP 97/60 | HR 74 | Temp 97.8°F | Ht 62.0 in | Wt 161.0 lb

## 2020-11-24 DIAGNOSIS — E8881 Metabolic syndrome: Secondary | ICD-10-CM | POA: Diagnosis not present

## 2020-11-24 DIAGNOSIS — M8580 Other specified disorders of bone density and structure, unspecified site: Secondary | ICD-10-CM | POA: Insufficient documentation

## 2020-11-24 DIAGNOSIS — Z6836 Body mass index (BMI) 36.0-36.9, adult: Secondary | ICD-10-CM

## 2020-11-24 DIAGNOSIS — Z780 Asymptomatic menopausal state: Secondary | ICD-10-CM | POA: Insufficient documentation

## 2020-11-24 DIAGNOSIS — E88819 Insulin resistance, unspecified: Secondary | ICD-10-CM

## 2020-11-24 NOTE — Progress Notes (Signed)
Chief Complaint:   OBESITY Carmen Garza is here to discuss her progress with her obesity treatment plan along with follow-up of her obesity related diagnoses. Carmen Garza is on the Category 2 Plan plus 100 calories and states she is following her eating plan approximately 80% of the time. Carmen Garza states she is doing weights for 30 minutes 3 times per week and walking for 40 minutes 4 times per week.  Today's visit was #: 11 Starting weight: 167 lbs Starting date: 05/13/2020 Today's weight: 161 lbs Today's date: 11/24/2020 Total lbs lost to date: 6 lbs Total lbs lost since last in-office visit: 6 lbs  Interim History: Carmen Garza has done very well on the plan over the past few weeks. She has increased her water intake. Her mom is still staying with her and tends to bring in Snacks which makes it harder for Carmen Garza to adhere to plan. . She saves her snack calories for night time. Her goal weight is 135 lbs (24 BMI).  Subjective:   1. Insulin resistance Carmen Garza denies excessive hunger. Not on metformin. She does not want to take medication.  Lab Results  Component Value Date   INSULIN 9.4 10/13/2020   INSULIN 17.7 05/13/2020   Lab Results  Component Value Date   HGBA1C 5.5 10/13/2020    2. Osteopenia, unspecified location Carmen Garza's T score of recent DXA Scan ws -1.9. She is is compliant with Vitamin D and Calcium. Her last Vitamin D level was 77.9.  Assessment/Plan:   1. Insulin resistance Carmen Garza will continue to work on weight loss, exercise, and decreasing simple carbohydrates to help decrease the risk of diabetes.   2. Osteopenia, unspecified location Carmen Garza will follow up as directed with Dr. Kenton Kingfisher (PCP).  3. Overweight: Current BMI 29.44 Carmen Garza is currently in the action stage of change. As such, her goal is to continue with weight loss efforts. She has agreed to the Category 2 Plan plus 100 calories.  Exercise goals:  As is.  Behavioral modification strategies: better snacking  choices and planning for success.  Carmen Garza has agreed to follow-up with our clinic in 3 weeks.  Objective:   Blood pressure 97/60, pulse 74, temperature 97.8 F (36.6 C), height '5\' 2"'$  (1.575 m), weight 161 lb (73 kg), SpO2 98 %. Body mass index is 29.45 kg/m.  General: Cooperative, alert, well developed, in no acute distress. HEENT: Conjunctivae and lids unremarkable. Cardiovascular: Regular rhythm.  Lungs: Normal work of breathing. Neurologic: No focal deficits.   Lab Results  Component Value Date   CREATININE 0.51 (L) 10/13/2020   BUN 12 10/13/2020   NA 143 10/13/2020   K 4.8 10/13/2020   CL 106 10/13/2020   CO2 23 10/13/2020   Lab Results  Component Value Date   ALT 24 10/13/2020   AST 16 10/13/2020   ALKPHOS 84 10/13/2020   BILITOT 0.4 10/13/2020   Lab Results  Component Value Date   HGBA1C 5.5 10/13/2020   HGBA1C 5.8 (H) 05/13/2020   Lab Results  Component Value Date   INSULIN 9.4 10/13/2020   INSULIN 17.7 05/13/2020   Lab Results  Component Value Date   TSH 1.110 05/13/2020   Lab Results  Component Value Date   CHOL 180 10/13/2020   HDL 59 10/13/2020   LDLCALC 102 (H) 10/13/2020   LDLDIRECT 143.6 07/22/2007   TRIG 106 10/13/2020   CHOLHDL 2.9 05/13/2020   Lab Results  Component Value Date   VD25OH 77.9 10/13/2020   VD25OH 29.2 (L) 05/13/2020  Lab Results  Component Value Date   WBC 6.7 05/13/2020   HGB 14.1 05/13/2020   HCT 42.5 05/13/2020   MCV 90 05/13/2020   PLT 269 05/13/2020   Lab Results  Component Value Date   IRON 72 05/13/2020   TIBC 321 05/13/2020   FERRITIN 203 (H) 05/13/2020    Attestation Statements:   Reviewed by clinician on day of visit: allergies, medications, problem list, medical history, surgical history, family history, social history, and previous encounter notes.  I, Lizbeth Bark, RMA, am acting as Location manager for [employer], [place].   I have reviewed the above documentation for accuracy and  completeness, and I agree with the above. -  Georgianne Fick, FNP

## 2020-11-25 DIAGNOSIS — E88819 Insulin resistance, unspecified: Secondary | ICD-10-CM | POA: Insufficient documentation

## 2020-11-25 DIAGNOSIS — E8881 Metabolic syndrome: Secondary | ICD-10-CM | POA: Insufficient documentation

## 2020-12-15 ENCOUNTER — Ambulatory Visit (INDEPENDENT_AMBULATORY_CARE_PROVIDER_SITE_OTHER): Payer: BC Managed Care – PPO | Admitting: Family Medicine

## 2020-12-15 ENCOUNTER — Encounter (INDEPENDENT_AMBULATORY_CARE_PROVIDER_SITE_OTHER): Payer: Self-pay | Admitting: Family Medicine

## 2020-12-15 ENCOUNTER — Other Ambulatory Visit: Payer: Self-pay

## 2020-12-15 VITALS — BP 108/75 | HR 68 | Temp 97.7°F | Ht 62.0 in | Wt 159.0 lb

## 2020-12-15 DIAGNOSIS — Z6836 Body mass index (BMI) 36.0-36.9, adult: Secondary | ICD-10-CM

## 2020-12-15 DIAGNOSIS — E88819 Insulin resistance, unspecified: Secondary | ICD-10-CM

## 2020-12-15 DIAGNOSIS — E8881 Metabolic syndrome: Secondary | ICD-10-CM | POA: Diagnosis not present

## 2020-12-15 NOTE — Progress Notes (Signed)
Chief Complaint:   OBESITY Carmen Garza is here to discuss her progress with her obesity treatment plan along with follow-up of her obesity related diagnoses. Carmen Garza is on the Category 2 Plan + 1000 calories and states she is following her eating plan approximately 80% of the time. Carmen Garza states she is doing cardio for 30 minutes 3 times per week and weights for 40 minutes 4 times per week.  Today's visit was #: 12 Starting weight: 167 lbs Starting date: 05/13/2020 Today's weight: 159 lbs Today's date: 12/15/2020 Total lbs lost to date: 38 lbs Total lbs lost since last in-office visit: 2 lbs  Interim History: Carmen Garza's mother is still staying at her house which makes it more difficult to for Carmen Garza to adhere to plan. Her mother will be leaving next week. She has had some extra treats but other than that is sticking to plan. Carmen Garza is doing an excellent job with plan adherence and exercise.  She denies excessive hunger.  Subjective:   1. Insulin resistance Lexii's hunger is satisfied. A1c was previously in pre-diabetes range (A1C has been 5.8).  Lab Results  Component Value Date   INSULIN 9.4 10/13/2020   INSULIN 17.7 05/13/2020   Lab Results  Component Value Date   HGBA1C 5.5 10/13/2020    Assessment/Plan:   1. Insulin resistance Carmen Garza will continue meal plan. She will continue to work on weight loss, exercise, and decreasing simple carbohydrates to help decrease the risk of diabetes. Carmen Garza agreed to follow-up with [name] as directed to closely monitor her progress.   2. Overweight: Current BMI 29.07 Carmen Garza is currently in the action stage of change. As such, her goal is to continue with weight loss efforts. She has agreed to the Category 2 Plan and keeping a food journal and adhering to recommended goals of 400-550 calories and 35 + grams of protein daily.  Exercise goals:  As is.  Behavioral modification strategies: decreasing simple carbohydrates.  Carmen Garza has agreed to  follow-up with our clinic in 3 weeks with Dr. Leafy Ro.  Objective:   Blood pressure 108/75, pulse 68, temperature 97.7 F (36.5 C), height '5\' 2"'$  (1.575 m), weight 159 lb (72.1 kg), SpO2 98 %. Body mass index is 29.08 kg/m.  General: Cooperative, alert, well developed, in no acute distress. HEENT: Conjunctivae and lids unremarkable. Cardiovascular: Regular rhythm.  Lungs: Normal work of breathing. Neurologic: No focal deficits.   Lab Results  Component Value Date   CREATININE 0.51 (L) 10/13/2020   BUN 12 10/13/2020   NA 143 10/13/2020   K 4.8 10/13/2020   CL 106 10/13/2020   CO2 23 10/13/2020   Lab Results  Component Value Date   ALT 24 10/13/2020   AST 16 10/13/2020   ALKPHOS 84 10/13/2020   BILITOT 0.4 10/13/2020   Lab Results  Component Value Date   HGBA1C 5.5 10/13/2020   HGBA1C 5.8 (H) 05/13/2020   Lab Results  Component Value Date   INSULIN 9.4 10/13/2020   INSULIN 17.7 05/13/2020   Lab Results  Component Value Date   TSH 1.110 05/13/2020   Lab Results  Component Value Date   CHOL 180 10/13/2020   HDL 59 10/13/2020   LDLCALC 102 (H) 10/13/2020   LDLDIRECT 143.6 07/22/2007   TRIG 106 10/13/2020   CHOLHDL 2.9 05/13/2020   Lab Results  Component Value Date   VD25OH 77.9 10/13/2020   VD25OH 29.2 (L) 05/13/2020   Lab Results  Component Value Date   WBC 6.7 05/13/2020  HGB 14.1 05/13/2020   HCT 42.5 05/13/2020   MCV 90 05/13/2020   PLT 269 05/13/2020   Lab Results  Component Value Date   IRON 72 05/13/2020   TIBC 321 05/13/2020   FERRITIN 203 (H) 05/13/2020   Attestation Statements:   Reviewed by clinician on day of visit: allergies, medications, problem list, medical history, surgical history, family history, social history, and previous encounter notes.   I, Carmen Garza, RMA, am acting as Location manager for [employer], [place].   I have reviewed the above documentation for accuracy and completeness, and I agree with the above. -  Georgianne Fick, FNP

## 2021-01-05 ENCOUNTER — Encounter (INDEPENDENT_AMBULATORY_CARE_PROVIDER_SITE_OTHER): Payer: Self-pay | Admitting: Family Medicine

## 2021-01-05 ENCOUNTER — Other Ambulatory Visit: Payer: Self-pay

## 2021-01-05 ENCOUNTER — Ambulatory Visit (INDEPENDENT_AMBULATORY_CARE_PROVIDER_SITE_OTHER): Payer: BC Managed Care – PPO | Admitting: Family Medicine

## 2021-01-05 VITALS — BP 113/72 | HR 68 | Temp 97.7°F | Ht 62.0 in | Wt 157.0 lb

## 2021-01-05 DIAGNOSIS — F3289 Other specified depressive episodes: Secondary | ICD-10-CM | POA: Diagnosis not present

## 2021-01-05 DIAGNOSIS — E559 Vitamin D deficiency, unspecified: Secondary | ICD-10-CM | POA: Diagnosis not present

## 2021-01-05 DIAGNOSIS — Z6836 Body mass index (BMI) 36.0-36.9, adult: Secondary | ICD-10-CM | POA: Diagnosis not present

## 2021-01-05 NOTE — Progress Notes (Signed)
Chief Complaint:   OBESITY Carmen Garza is here to discuss her progress with her obesity treatment plan along with follow-up of her obesity related diagnoses. Carmen Garza is on the Category 2 Plan and keeping a food journal and adhering to recommended goals of 400-500 calories and 35+ grams of protein at supper daily and states she is following her eating plan approximately 75% of the time. Carmen Garza states she is doing cardio and lifting weights for 45 minutes 2 times per week.  Today's visit was #: 50 Starting weight: 167 lbs Starting date: 05/13/2020 Today's weight: 157 lbs Today's date: 01/05/2021 Total lbs lost to date: 10 Total lbs lost since last in-office visit: 2  Interim History: Carmen Garza continues to work on diet and weight loss. She has extra challenges with the unexpected death of her brother in-law. She did some emotional eating but she remained mindful. She has already gotten back on track.  Subjective:   1. Vitamin D deficiency Carmen Garza is on Vit D, and no side effects were noted. Her level is not yet at goal.  2. Other depression without emotional eating Carmen Garza has had a difficult time grieving her brother in-law. She has been stable on her Effexor and she seems to be handling this reasonably well.  Assessment/Plan:   1. Vitamin D deficiency Low Vitamin D level contributes to fatigue and are associated with obesity, breast, and colon cancer. Carmen Garza will continue OTC Vitamin D and prescription Vitamin D, and we will recheck labs in 1 month. She will follow-up for routine testing of Vitamin D, at least 2-3 times per year to avoid over-replacement.  2. Other depression without emotional eating Behavior modification techniques were discussed today to help Carmen Garza deal with her emotional/non-hunger eating behaviors. Carmen Garza will continue Effexor and will continue to monitor. Orders and follow up as documented in patient record.   3. Obesity with Current BMI of 28.8 Carmen Garza is currently in  the action stage of change. As such, her goal is to continue with weight loss efforts. She has agreed to the Category 2 Plan and keeping a food journal and adhering to recommended goals of 400-500 calories and 35+ grams of protein at supper daily.   Exercise goals: As is.  Behavioral modification strategies: increasing lean protein intake and meal planning and cooking strategies.  Carmen Garza has agreed to follow-up with our clinic in 3 weeks. She was informed of the importance of frequent follow-up visits to maximize her success with intensive lifestyle modifications for her multiple health conditions.   Objective:   Blood pressure 113/72, pulse 68, temperature 97.7 F (36.5 C), height 5\' 2"  (1.575 m), weight 157 lb (71.2 kg), SpO2 99 %. Body mass index is 28.72 kg/m.  General: Cooperative, alert, well developed, in no acute distress. HEENT: Conjunctivae and lids unremarkable. Cardiovascular: Regular rhythm.  Lungs: Normal work of breathing. Neurologic: No focal deficits.   Lab Results  Component Value Date   CREATININE 0.51 (L) 10/13/2020   BUN 12 10/13/2020   NA 143 10/13/2020   K 4.8 10/13/2020   CL 106 10/13/2020   CO2 23 10/13/2020   Lab Results  Component Value Date   ALT 24 10/13/2020   AST 16 10/13/2020   ALKPHOS 84 10/13/2020   BILITOT 0.4 10/13/2020   Lab Results  Component Value Date   HGBA1C 5.5 10/13/2020   HGBA1C 5.8 (H) 05/13/2020   Lab Results  Component Value Date   INSULIN 9.4 10/13/2020   INSULIN 17.7 05/13/2020  Lab Results  Component Value Date   TSH 1.110 05/13/2020   Lab Results  Component Value Date   CHOL 180 10/13/2020   HDL 59 10/13/2020   LDLCALC 102 (H) 10/13/2020   LDLDIRECT 143.6 07/22/2007   TRIG 106 10/13/2020   CHOLHDL 2.9 05/13/2020   Lab Results  Component Value Date   VD25OH 77.9 10/13/2020   VD25OH 29.2 (L) 05/13/2020   Lab Results  Component Value Date   WBC 6.7 05/13/2020   HGB 14.1 05/13/2020   HCT 42.5  05/13/2020   MCV 90 05/13/2020   PLT 269 05/13/2020   Lab Results  Component Value Date   IRON 72 05/13/2020   TIBC 321 05/13/2020   FERRITIN 203 (H) 05/13/2020   Attestation Statements:   Reviewed by clinician on day of visit: allergies, medications, problem list, medical history, surgical history, family history, social history, and previous encounter notes.  Time spent on visit including pre-visit chart review and post-visit care and charting was 30 minutes.    I, Trixie Dredge, am acting as transcriptionist for Dennard Nip, MD.  I have reviewed the above documentation for accuracy and completeness, and I agree with the above. -  Dennard Nip, MD

## 2021-01-26 ENCOUNTER — Encounter (INDEPENDENT_AMBULATORY_CARE_PROVIDER_SITE_OTHER): Payer: Self-pay | Admitting: Bariatrics

## 2021-01-26 ENCOUNTER — Other Ambulatory Visit: Payer: Self-pay

## 2021-01-26 ENCOUNTER — Ambulatory Visit (INDEPENDENT_AMBULATORY_CARE_PROVIDER_SITE_OTHER): Payer: BC Managed Care – PPO | Admitting: Bariatrics

## 2021-01-26 VITALS — BP 107/73 | HR 81 | Temp 97.4°F | Ht 62.0 in | Wt 154.0 lb

## 2021-01-26 DIAGNOSIS — M8580 Other specified disorders of bone density and structure, unspecified site: Secondary | ICD-10-CM | POA: Diagnosis not present

## 2021-01-26 DIAGNOSIS — R7303 Prediabetes: Secondary | ICD-10-CM | POA: Diagnosis not present

## 2021-01-26 DIAGNOSIS — E559 Vitamin D deficiency, unspecified: Secondary | ICD-10-CM | POA: Diagnosis not present

## 2021-01-26 DIAGNOSIS — Z6836 Body mass index (BMI) 36.0-36.9, adult: Secondary | ICD-10-CM

## 2021-01-26 NOTE — Progress Notes (Signed)
Chief Complaint:   OBESITY Carmen Garza is here to discuss her progress with her obesity treatment plan along with follow-up of her obesity related diagnoses. Carmen Garza is on the Category 2 Plan and keeping a food journal and adhering to recommended goals of 400-500 calories and 35 grams of protein at supper and states she is following her eating plan approximately 80% of the time. Carmen Garza states she is doing weights and cardio for 30 minutes 2-3 times per week.  Today's visit was #: 14 Starting weight: 167 lbs Starting date: 05/13/2020 Today's weight: 154 lbs Today's date: 01/26/2021 Total lbs lost to date: 13 lbs Total lbs lost since last in-office visit: 3 lbs  Interim History: Carmen Garza is down 3 lbs. She is trying to stay on her protein. She is getting variety in her meals.   Subjective:   1. Pre-diabetes Carmen Garza is not on medications for Pre-diabetes currently.   2. Vitamin D deficiency Carmen Garza is taking OTC Vitamin D currently.   3. Osteopenia, unspecified location Carmen Garza is taking calcium and Vitamin D currently.   Assessment/Plan:   1. Pre-diabetes Carmen Garza will continue to work on weight loss, exercise, and decreasing simple carbohydrates to help decrease the risk of diabetes.   2. Vitamin D deficiency Low Vitamin D level contributes to fatigue and are associated with obesity, breast, and colon cancer. Carmen Garza agrees to continue to take OTC Vitamin D 5,000 IU daily and she will follow-up for routine testing of Vitamin D, at least 2-3 times per year to avoid over-replacement.  3. Osteopenia, unspecified location Carmen Garza will continue calcium and Vitamin D.  4. Obesity with Current BMI of 28.2 Carmen Garza is currently in the action stage of change. As such, her goal is to continue with weight loss efforts. She has agreed to the Category 2 Plan and keeping a food journal and adhering to recommended goals of 400-500 calories and 35 plus grams of protein at supper.  Carmen Garza will continue  meal planning and intentional eating.  Exercise goals:  As is.  Behavioral modification strategies: increasing lean protein intake, decreasing simple carbohydrates, increasing vegetables, increasing water intake, decreasing eating out, no skipping meals, meal planning and cooking strategies, keeping healthy foods in the home, and planning for success.  Carmen Garza has agreed to follow-up with our clinic in 3 weeks (fasting). She was informed of the importance of frequent follow-up visits to maximize her success with intensive lifestyle modifications for her multiple health conditions.   Objective:   Blood pressure 107/73, pulse 81, temperature (!) 97.4 F (36.3 C), height 5\' 2"  (1.575 m), weight 154 lb (69.9 kg), SpO2 99 %. Body mass index is 28.17 kg/m.  General: Cooperative, alert, well developed, in no acute distress. HEENT: Conjunctivae and lids unremarkable. Cardiovascular: Regular rhythm.  Lungs: Normal work of breathing. Neurologic: No focal deficits.   Lab Results  Component Value Date   CREATININE 0.51 (L) 10/13/2020   BUN 12 10/13/2020   NA 143 10/13/2020   K 4.8 10/13/2020   CL 106 10/13/2020   CO2 23 10/13/2020   Lab Results  Component Value Date   ALT 24 10/13/2020   AST 16 10/13/2020   ALKPHOS 84 10/13/2020   BILITOT 0.4 10/13/2020   Lab Results  Component Value Date   HGBA1C 5.5 10/13/2020   HGBA1C 5.8 (H) 05/13/2020   Lab Results  Component Value Date   INSULIN 9.4 10/13/2020   INSULIN 17.7 05/13/2020   Lab Results  Component Value Date   TSH  1.110 05/13/2020   Lab Results  Component Value Date   CHOL 180 10/13/2020   HDL 59 10/13/2020   LDLCALC 102 (H) 10/13/2020   LDLDIRECT 143.6 07/22/2007   TRIG 106 10/13/2020   CHOLHDL 2.9 05/13/2020   Lab Results  Component Value Date   VD25OH 77.9 10/13/2020   VD25OH 29.2 (L) 05/13/2020   Lab Results  Component Value Date   WBC 6.7 05/13/2020   HGB 14.1 05/13/2020   HCT 42.5 05/13/2020   MCV 90  05/13/2020   PLT 269 05/13/2020   Lab Results  Component Value Date   IRON 72 05/13/2020   TIBC 321 05/13/2020   FERRITIN 203 (H) 05/13/2020   Attestation Statements:   Reviewed by clinician on day of visit: allergies, medications, problem list, medical history, surgical history, family history, social history, and previous encounter notes.  I, Lizbeth Bark, RMA, am acting as Location manager for [employer], [place].   I have reviewed the above documentation for accuracy and completeness, and I agree with the above. Jearld Lesch, DO

## 2021-01-27 ENCOUNTER — Encounter (INDEPENDENT_AMBULATORY_CARE_PROVIDER_SITE_OTHER): Payer: Self-pay | Admitting: Bariatrics

## 2021-01-27 DIAGNOSIS — Z20828 Contact with and (suspected) exposure to other viral communicable diseases: Secondary | ICD-10-CM | POA: Diagnosis not present

## 2021-01-27 DIAGNOSIS — J111 Influenza due to unidentified influenza virus with other respiratory manifestations: Secondary | ICD-10-CM | POA: Diagnosis not present

## 2021-02-15 ENCOUNTER — Encounter (INDEPENDENT_AMBULATORY_CARE_PROVIDER_SITE_OTHER): Payer: Self-pay

## 2021-02-16 ENCOUNTER — Other Ambulatory Visit: Payer: Self-pay

## 2021-02-16 ENCOUNTER — Ambulatory Visit (INDEPENDENT_AMBULATORY_CARE_PROVIDER_SITE_OTHER): Payer: BC Managed Care – PPO | Admitting: Family Medicine

## 2021-02-16 ENCOUNTER — Encounter (INDEPENDENT_AMBULATORY_CARE_PROVIDER_SITE_OTHER): Payer: Self-pay | Admitting: Family Medicine

## 2021-02-16 VITALS — BP 98/66 | HR 74 | Temp 97.5°F | Ht 62.0 in | Wt 153.0 lb

## 2021-02-16 DIAGNOSIS — F3289 Other specified depressive episodes: Secondary | ICD-10-CM | POA: Diagnosis not present

## 2021-02-16 DIAGNOSIS — E559 Vitamin D deficiency, unspecified: Secondary | ICD-10-CM

## 2021-02-16 DIAGNOSIS — Z6836 Body mass index (BMI) 36.0-36.9, adult: Secondary | ICD-10-CM

## 2021-02-16 NOTE — Progress Notes (Signed)
Chief Complaint:   OBESITY Carmen Garza is here to discuss her progress with her obesity treatment plan along with follow-up of her obesity related diagnoses. Carmen Garza is on the Category 2 Plan and keeping a food journal and adhering to recommended goals of 400-500 calories and 35+ grams of protein at supper daily and states she is following her eating plan approximately 75% of the time. Carmen Garza states she is walking and lifting weights for 30-45 minutes 3 times per week.  Today's visit was #: 15 Starting weight: 167 lbs Starting date: 05/13/2020 Today's weight: 153 lbs Today's date: 02/16/2021 Total lbs lost to date: 14 Total lbs lost since last in-office visit: 1  Interim History: Carmen Garza continues to do well with weight loss. She had extra challenges with traveling and watching her grandkids. She was active during this time, but she hasn't been able to do as much formal exercise.  Subjective:   1. Vitamin D deficiency Carmen Garza is stable on Vit D, and she is due for labs soon. She denies signs of over-replacement.  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 She notes she is struggling with increased snacking.  Assessment/Plan:   1. Vitamin D deficiency Low Vitamin D level contributes to fatigue and are associated with obesity, breast, and colon cancer. Carmen Garza will continue Vitamin D as is, and we will recheck labs in 3 weeks. She will follow-up for routine testing of Vitamin D, at least 2-3 times per year to avoid over-replacement.  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 She will continue Effexor and we will follow up at her next visit. Orders and follow up as documented in patient record.   3. Obesity BMI today is 60 Carmen Garza is currently in the action stage of change.  As such, her goal is to continue with weight loss efforts. She has agreed to the Category 2 Plan and keeping a food journal and adhering to recommended goals of 400-500 calories and 35+ grams of protein at supper daily.   We will recheck fasting labs at her next visit.  Exercise goals: As is.  Behavioral modification strategies: increasing lean protein intake, better snacking choices, and holiday eating strategies .  Carmen Garza has agreed to follow-up with our clinic in 3 weeks. She was informed of the importance of frequent follow-up visits to maximize her success with intensive lifestyle modifications for her multiple health conditions.   Objective:   Blood pressure 98/66, pulse 74, temperature (!) 97.5 F (36.4 C), height 5\' 2"  (1.575 m), weight 153 lb (69.4 kg), SpO2 98 %. Body mass index is 27.98 kg/m.  General: Cooperative, alert, well developed, in no acute distress. HEENT: Conjunctivae and lids unremarkable. Cardiovascular: Regular rhythm.  Lungs: Normal work of breathing. Neurologic: No focal deficits.   Lab Results  Component Value Date   CREATININE 0.51 (L) 10/13/2020   BUN 12 10/13/2020   NA 143 10/13/2020   K 4.8 10/13/2020   CL 106 10/13/2020   CO2 23 10/13/2020   Lab Results  Component Value Date   ALT 24 10/13/2020   AST 16 10/13/2020   ALKPHOS 84 10/13/2020   BILITOT 0.4 10/13/2020   Lab Results  Component Value Date   HGBA1C 5.5 10/13/2020   HGBA1C 5.8 (H) 05/13/2020   Lab Results  Component Value Date   INSULIN 9.4 10/13/2020  INSULIN 17.7 05/13/2020   Lab Results  Component Value Date   TSH 1.110 05/13/2020   Lab Results  Component Value Date   CHOL 180 10/13/2020   HDL 59 10/13/2020   LDLCALC 102 (H) 10/13/2020   LDLDIRECT 143.6 07/22/2007   TRIG 106 10/13/2020   CHOLHDL 2.9 05/13/2020   Lab Results  Component Value Date   VD25OH 77.9 10/13/2020   VD25OH 29.2 (L) 05/13/2020   Lab Results  Component Value Date   WBC 6.7 05/13/2020    HGB 14.1 05/13/2020   HCT 42.5 05/13/2020   MCV 90 05/13/2020   PLT 269 05/13/2020   Lab Results  Component Value Date   IRON 72 05/13/2020   TIBC 321 05/13/2020   FERRITIN 203 (H) 05/13/2020   Attestation Statements:   Reviewed by clinician on day of visit: allergies, medications, problem list, medical history, surgical history, family history, social history, and previous encounter notes.  Time spent on visit including pre-visit chart review and post-visit care and charting was 30 minutes.    I, Trixie Dredge, am acting as transcriptionist for Dennard Nip, MD.  I have reviewed the above documentation for accuracy and completeness, and I agree with the above. -  Dennard Nip, MD

## 2021-03-09 ENCOUNTER — Other Ambulatory Visit: Payer: Self-pay

## 2021-03-09 ENCOUNTER — Ambulatory Visit (INDEPENDENT_AMBULATORY_CARE_PROVIDER_SITE_OTHER): Payer: BC Managed Care – PPO | Admitting: Bariatrics

## 2021-03-09 ENCOUNTER — Encounter (INDEPENDENT_AMBULATORY_CARE_PROVIDER_SITE_OTHER): Payer: Self-pay | Admitting: Bariatrics

## 2021-03-09 VITALS — BP 95/54 | HR 66 | Temp 97.5°F | Ht 62.0 in | Wt 154.0 lb

## 2021-03-09 DIAGNOSIS — E559 Vitamin D deficiency, unspecified: Secondary | ICD-10-CM | POA: Diagnosis not present

## 2021-03-09 DIAGNOSIS — Z6836 Body mass index (BMI) 36.0-36.9, adult: Secondary | ICD-10-CM

## 2021-03-09 DIAGNOSIS — E8881 Metabolic syndrome: Secondary | ICD-10-CM

## 2021-03-09 DIAGNOSIS — F5089 Other specified eating disorder: Secondary | ICD-10-CM | POA: Diagnosis not present

## 2021-03-09 NOTE — Progress Notes (Signed)
Chief Complaint:   OBESITY Carmen Garza is here to discuss her progress with her obesity treatment plan along with follow-up of her obesity related diagnoses. Carmen Garza is on the Category 2 Plan and states she is following her eating plan approximately 75% of the time. Carmen Garza states she is doing cardio and weights for 30-45 minutes 3 times per week.  Today's visit was #: 83 Starting weight: 167 lbs Starting date: 05/13/2020 Today's weight: 154 lbs Today's date: 03/09/2021 Total lbs lost to date: 13 lbs Total lbs lost since last in-office visit: 0  Interim History: Carmen Garza is up 1 lb since her last visit. She ate more over the holidays and she is now trying to get back on track.  Subjective:   1. Vitamin D deficiency Carmen Garza is currently taking Vitamin D.  2. Insulin resistance Carmen Garza is not on medications currently.  3. Other disorder of eating Carmen Garza is currently taking Effexor.  Assessment/Plan:   1. Vitamin D deficiency Low Vitamin D level contributes to fatigue and are associated with obesity, breast, and colon cancer. We will check Vitamin D today and Noreene will follow-up for routine testing of Vitamin D, at least 2-3 times per year to avoid over-replacement.  - VITAMIN D 25 Hydroxy (Vit-D Deficiency, Fractures)  2. Insulin resistance Eldine will continue to work on weight loss, exercise, and decreasing simple carbohydrates to help decrease the risk of diabetes. We will check A1C, Insulin and CMP today. Carmen Garza agreed to follow-up with [name] as directed to closely monitor her progress.  - Hemoglobin A1c - Comprehensive metabolic panel - Insulin, random  3. Other disorder of eating Behavior modification techniques were discussed today to help Ece deal with her emotional/non-hunger eating behaviors.  Shameca will continue her medications. She will increase activities. Orders and follow up as documented in patient record.    4. Obesity BMI today is 28.2 Carmen Garza is currently in  the action stage of change. As such, her goal is to continue with weight loss efforts. She has agreed to the Category 2 Plan.   Carmen Garza will continue meal planning and she will continue intentional eating. She will increase protein and water intake.  Exercise goals:  Carmen Garza started back with exercise last week.  Behavioral modification strategies: increasing lean protein intake, decreasing simple carbohydrates, increasing vegetables, increasing water intake, decreasing eating out, no skipping meals, meal planning and cooking strategies, keeping healthy foods in the home, and planning for success.  Carmen Garza has agreed to follow-up with our clinic in 4 weeks with Jake Bathe, FNP or Mina Marble, NP or Dr. Leafy Ro. She was informed of the importance of frequent follow-up visits to maximize her success with intensive lifestyle modifications for her multiple health conditions.   Carmen Garza was informed we would discuss her lab results at her next visit unless there is a critical issue that needs to be addressed sooner. Carmen Garza agreed to keep her next visit at the agreed upon time to discuss these results.  Objective:   Blood pressure (!) 95/54, pulse 66, temperature (!) 97.5 F (36.4 C), height 5\' 2"  (1.575 m), weight 154 lb (69.9 kg), SpO2 99 %. Body mass index is 28.17 kg/m.  General: Cooperative, alert, well developed, in no acute distress. HEENT: Conjunctivae and lids unremarkable. Cardiovascular: Regular rhythm.  Lungs: Normal work of breathing. Neurologic: No focal deficits.   Lab Results  Component Value Date   CREATININE 0.51 (L) 10/13/2020   BUN 12 10/13/2020   NA 143 10/13/2020   K  4.8 10/13/2020   CL 106 10/13/2020   CO2 23 10/13/2020   Lab Results  Component Value Date   ALT 24 10/13/2020   AST 16 10/13/2020   ALKPHOS 84 10/13/2020   BILITOT 0.4 10/13/2020   Lab Results  Component Value Date   HGBA1C 5.5 10/13/2020   HGBA1C 5.8 (H) 05/13/2020   Lab Results  Component  Value Date   INSULIN 9.4 10/13/2020   INSULIN 17.7 05/13/2020   Lab Results  Component Value Date   TSH 1.110 05/13/2020   Lab Results  Component Value Date   CHOL 180 10/13/2020   HDL 59 10/13/2020   LDLCALC 102 (H) 10/13/2020   LDLDIRECT 143.6 07/22/2007   TRIG 106 10/13/2020   CHOLHDL 2.9 05/13/2020   Lab Results  Component Value Date   VD25OH 77.9 10/13/2020   VD25OH 29.2 (L) 05/13/2020   Lab Results  Component Value Date   WBC 6.7 05/13/2020   HGB 14.1 05/13/2020   HCT 42.5 05/13/2020   MCV 90 05/13/2020   PLT 269 05/13/2020   Lab Results  Component Value Date   IRON 72 05/13/2020   TIBC 321 05/13/2020   FERRITIN 203 (H) 05/13/2020   Attestation Statements:   Reviewed by clinician on day of visit: allergies, medications, problem list, medical history, surgical history, family history, social history, and previous encounter notes.  I, Lizbeth Bark, RMA, am acting as Location manager for [employer], DO.   I have reviewed the above documentation for accuracy and completeness, and I agree with the above. Jearld Lesch, DO

## 2021-03-10 LAB — COMPREHENSIVE METABOLIC PANEL
ALT: 21 IU/L (ref 0–32)
AST: 16 IU/L (ref 0–40)
Albumin/Globulin Ratio: 1.9 (ref 1.2–2.2)
Albumin: 4.7 g/dL (ref 3.8–4.9)
Alkaline Phosphatase: 70 IU/L (ref 44–121)
BUN/Creatinine Ratio: 34 — ABNORMAL HIGH (ref 9–23)
BUN: 18 mg/dL (ref 6–24)
Bilirubin Total: 0.5 mg/dL (ref 0.0–1.2)
CO2: 27 mmol/L (ref 20–29)
Calcium: 9.6 mg/dL (ref 8.7–10.2)
Chloride: 103 mmol/L (ref 96–106)
Creatinine, Ser: 0.53 mg/dL — ABNORMAL LOW (ref 0.57–1.00)
Globulin, Total: 2.5 g/dL (ref 1.5–4.5)
Glucose: 92 mg/dL (ref 70–99)
Potassium: 5.2 mmol/L (ref 3.5–5.2)
Sodium: 143 mmol/L (ref 134–144)
Total Protein: 7.2 g/dL (ref 6.0–8.5)
eGFR: 110 mL/min/{1.73_m2} (ref >59)

## 2021-03-10 LAB — VITAMIN D 25 HYDROXY (VIT D DEFICIENCY, FRACTURES): Vit D, 25-Hydroxy: 69.9 ng/mL (ref 30.0–100.0)

## 2021-03-10 LAB — HEMOGLOBIN A1C
Est. average glucose Bld gHb Est-mCnc: 111 mg/dL
Hgb A1c MFr Bld: 5.5 % (ref 4.8–5.6)

## 2021-03-10 LAB — INSULIN, RANDOM: INSULIN: 11.9 u[IU]/mL (ref 2.6–24.9)

## 2021-03-29 ENCOUNTER — Ambulatory Visit (INDEPENDENT_AMBULATORY_CARE_PROVIDER_SITE_OTHER): Payer: BC Managed Care – PPO | Admitting: Family Medicine

## 2021-03-29 ENCOUNTER — Other Ambulatory Visit: Payer: Self-pay

## 2021-03-29 ENCOUNTER — Encounter (INDEPENDENT_AMBULATORY_CARE_PROVIDER_SITE_OTHER): Payer: Self-pay | Admitting: Bariatrics

## 2021-03-29 ENCOUNTER — Encounter (INDEPENDENT_AMBULATORY_CARE_PROVIDER_SITE_OTHER): Payer: Self-pay | Admitting: Family Medicine

## 2021-03-29 VITALS — BP 87/57 | HR 71 | Temp 97.7°F | Ht 62.0 in | Wt 150.0 lb

## 2021-03-29 DIAGNOSIS — E860 Dehydration: Secondary | ICD-10-CM

## 2021-03-29 DIAGNOSIS — R7303 Prediabetes: Secondary | ICD-10-CM | POA: Diagnosis not present

## 2021-03-29 DIAGNOSIS — R0981 Nasal congestion: Secondary | ICD-10-CM

## 2021-03-29 DIAGNOSIS — Z6836 Body mass index (BMI) 36.0-36.9, adult: Secondary | ICD-10-CM

## 2021-03-29 NOTE — Progress Notes (Signed)
Chief Complaint:   OBESITY Carmen Garza is here to discuss her progress with her obesity treatment plan along with follow-up of her obesity related diagnoses. Carmen Garza is on the Category 2 Plan and states she is following her eating plan approximately 75% of the time. Carmen Garza states she is walking for 30-35 minutes 3 times per week.  Today's visit was #: 43 Starting weight: 167 lbs Starting date: 05/13/2020 Today's weight: 150 lbs Today's date: 03/29/2021 Total lbs lost to date: 17 Total lbs lost since last in-office visit: 4  Interim History: Carmen Garza continues to do very well with weight loss on her eating plan. Her hunger is mostly controlled and she is really working on meeting her protein goals.  Subjective:   1. Pre-diabetes Carmen Garza has done well with diet and weight loss. She notes decreased polyphagia when she increases protein. Her recent A1c was well controlled at 5.5.  2. Dehydration Carmen Garza's BUN/creatinine ratio has increased. She notes feeling a bit lightheaded.  3. Congestion  Carmen Garza notes increased congestion with colder weather. She takes Nasacort OTC as needed. She has no symptoms of upper respiratory infection.  Assessment/Plan:   1. Pre-diabetes Carmen Garza will continue to work on diet, exercise, and decreasing simple carbohydrates to help decrease the risk of diabetes.   2. Dehydration Carmen Garza is to increase her water intake, and will continue to monitor.  3. Congestion  Carmen Garza was encouraged to use Nasacort daily, and she will continue to follow up as directed.  4. Obesity BMI today is 27.6 Carmen Garza is currently in the action stage of change. As such, her goal is to continue with weight loss efforts. She has agreed to the Category 2 Plan.   Exercise goals: As is.  Behavioral modification strategies: holiday eating strategies .  Carmen Garza has agreed to follow-up with our clinic in 3 weeks. She was informed of the importance of frequent follow-up visits to maximize her  success with intensive lifestyle modifications for her multiple health conditions.   Objective:   Blood pressure (!) 87/57, pulse 71, temperature 97.7 F (36.5 C), height 5\' 2"  (1.575 m), weight 150 lb (68 kg), SpO2 98 %. Body mass index is 27.44 kg/m.  General: Cooperative, alert, well developed, in no acute distress. HEENT: Conjunctivae and lids unremarkable. Cardiovascular: Regular rhythm.  Lungs: Normal work of breathing. Neurologic: No focal deficits.   Lab Results  Component Value Date   CREATININE 0.53 (L) 03/09/2021   BUN 18 03/09/2021   NA 143 03/09/2021   K 5.2 03/09/2021   CL 103 03/09/2021   CO2 27 03/09/2021   Lab Results  Component Value Date   ALT 21 03/09/2021   AST 16 03/09/2021   ALKPHOS 70 03/09/2021   BILITOT 0.5 03/09/2021   Lab Results  Component Value Date   HGBA1C 5.5 03/09/2021   HGBA1C 5.5 10/13/2020   HGBA1C 5.8 (H) 05/13/2020   Lab Results  Component Value Date   INSULIN 11.9 03/09/2021   INSULIN 9.4 10/13/2020   INSULIN 17.7 05/13/2020   Lab Results  Component Value Date   TSH 1.110 05/13/2020   Lab Results  Component Value Date   CHOL 180 10/13/2020   HDL 59 10/13/2020   LDLCALC 102 (H) 10/13/2020   LDLDIRECT 143.6 07/22/2007   TRIG 106 10/13/2020   CHOLHDL 2.9 05/13/2020   Lab Results  Component Value Date   VD25OH 69.9 03/09/2021   VD25OH 77.9 10/13/2020   VD25OH 29.2 (L) 05/13/2020   Lab Results  Component  Value Date   WBC 6.7 05/13/2020   HGB 14.1 05/13/2020   HCT 42.5 05/13/2020   MCV 90 05/13/2020   PLT 269 05/13/2020   Lab Results  Component Value Date   IRON 72 05/13/2020   TIBC 321 05/13/2020   FERRITIN 203 (H) 05/13/2020   Attestation Statements:   Reviewed by clinician on day of visit: allergies, medications, problem list, medical history, surgical history, family history, social history, and previous encounter notes.  Time spent on visit including pre-visit chart review and post-visit care and  charting was 30 minutes.    I, Trixie Dredge, am acting as transcriptionist for Dennard Nip, MD.  I have reviewed the above documentation for accuracy and completeness, and I agree with the above. -  Dennard Nip, MD

## 2021-04-18 DIAGNOSIS — Z0000 Encounter for general adult medical examination without abnormal findings: Secondary | ICD-10-CM | POA: Diagnosis not present

## 2021-04-18 DIAGNOSIS — Z8543 Personal history of malignant neoplasm of ovary: Secondary | ICD-10-CM | POA: Diagnosis not present

## 2021-04-18 DIAGNOSIS — E7800 Pure hypercholesterolemia, unspecified: Secondary | ICD-10-CM | POA: Diagnosis not present

## 2021-04-19 ENCOUNTER — Encounter (INDEPENDENT_AMBULATORY_CARE_PROVIDER_SITE_OTHER): Payer: Self-pay | Admitting: Family Medicine

## 2021-04-19 ENCOUNTER — Other Ambulatory Visit: Payer: Self-pay

## 2021-04-19 ENCOUNTER — Ambulatory Visit (INDEPENDENT_AMBULATORY_CARE_PROVIDER_SITE_OTHER): Payer: BC Managed Care – PPO | Admitting: Family Medicine

## 2021-04-19 VITALS — BP 106/67 | HR 75 | Temp 97.9°F | Ht 62.0 in | Wt 152.0 lb

## 2021-04-19 DIAGNOSIS — Z6827 Body mass index (BMI) 27.0-27.9, adult: Secondary | ICD-10-CM

## 2021-04-19 DIAGNOSIS — E559 Vitamin D deficiency, unspecified: Secondary | ICD-10-CM | POA: Diagnosis not present

## 2021-04-19 DIAGNOSIS — Z6836 Body mass index (BMI) 36.0-36.9, adult: Secondary | ICD-10-CM

## 2021-04-20 NOTE — Progress Notes (Signed)
Chief Complaint:   OBESITY Carmen Garza is here to discuss her progress with her obesity treatment plan along with follow-up of her obesity related diagnoses. Carmen Garza is on the Category 2 Plan and states she is following her eating plan approximately 50% of the time. Carmen Garza states she is at the gym for 30 minutes 3-4 times per week.  Today's visit was #: 18 Starting weight: 167 lbs Starting date: 05/13/2020 Today's weight: 152 lbs Today's date: 04/19/2021 Total lbs lost to date: 15 Total lbs lost since last in-office visit: 0  Interim History: Carmen Garza did well minimizing her holiday weight gain. She has recently started on a modified fast at church, and she has tried to cut out snacking. Her hunger is controlled and she is working on getting back to the gym and meal planning.  Subjective:   1. Vitamin D deficiency Carmen Garza is stable on Vit D. She just had labs done at her primary care physician's office.  Assessment/Plan:   1. Vitamin D deficiency Carmen Garza will continue Vitamin D, and we will obtain her labs from her primary care physician. She will follow-up for routine testing of Vitamin D, at least 2-3 times per year to avoid over-replacement.  2. Obesity BMI today is 27.9 Carmen Garza is currently in the action stage of change. As such, her goal is to continue with weight loss efforts. She has agreed to the Category 2 Plan.   Exercise goals: As is.  Behavioral modification strategies: increasing lean protein intake and decreasing simple carbohydrates.  Carmen Garza has agreed to follow-up with our clinic in 4 weeks. She was informed of the importance of frequent follow-up visits to maximize her success with intensive lifestyle modifications for her multiple health conditions.   Objective:   Blood pressure 106/67, pulse 75, temperature 97.9 F (36.6 C), temperature source Oral, height 5\' 2"  (1.575 m), weight 152 lb (68.9 kg), SpO2 100 %. Body mass index is 27.8 kg/m.  General: Cooperative,  alert, well developed, in no acute distress. HEENT: Conjunctivae and lids unremarkable. Cardiovascular: Regular rhythm.  Lungs: Normal work of breathing. Neurologic: No focal deficits.   Lab Results  Component Value Date   CREATININE 0.53 (L) 03/09/2021   BUN 18 03/09/2021   NA 143 03/09/2021   K 5.2 03/09/2021   CL 103 03/09/2021   CO2 27 03/09/2021   Lab Results  Component Value Date   ALT 21 03/09/2021   AST 16 03/09/2021   ALKPHOS 70 03/09/2021   BILITOT 0.5 03/09/2021   Lab Results  Component Value Date   HGBA1C 5.5 03/09/2021   HGBA1C 5.5 10/13/2020   HGBA1C 5.8 (H) 05/13/2020   Lab Results  Component Value Date   INSULIN 11.9 03/09/2021   INSULIN 9.4 10/13/2020   INSULIN 17.7 05/13/2020   Lab Results  Component Value Date   TSH 1.110 05/13/2020   Lab Results  Component Value Date   CHOL 180 10/13/2020   HDL 59 10/13/2020   LDLCALC 102 (H) 10/13/2020   LDLDIRECT 143.6 07/22/2007   TRIG 106 10/13/2020   CHOLHDL 2.9 05/13/2020   Lab Results  Component Value Date   VD25OH 69.9 03/09/2021   VD25OH 77.9 10/13/2020   VD25OH 29.2 (L) 05/13/2020   Lab Results  Component Value Date   WBC 6.7 05/13/2020   HGB 14.1 05/13/2020   HCT 42.5 05/13/2020   MCV 90 05/13/2020   PLT 269 05/13/2020   Lab Results  Component Value Date   IRON 72 05/13/2020  TIBC 321 05/13/2020   FERRITIN 203 (H) 05/13/2020   Attestation Statements:   Reviewed by clinician on day of visit: allergies, medications, problem list, medical history, surgical history, family history, social history, and previous encounter notes.  Time spent on visit including pre-visit chart review and post-visit care and charting was 22 minutes.    I, Trixie Dredge, am acting as transcriptionist for Dennard Nip, MD.  I have reviewed the above documentation for accuracy and completeness, and I agree with the above. -  Dennard Nip, MD

## 2021-05-05 DIAGNOSIS — Z01419 Encounter for gynecological examination (general) (routine) without abnormal findings: Secondary | ICD-10-CM | POA: Diagnosis not present

## 2021-05-05 DIAGNOSIS — Z8543 Personal history of malignant neoplasm of ovary: Secondary | ICD-10-CM | POA: Diagnosis not present

## 2021-05-12 ENCOUNTER — Other Ambulatory Visit: Payer: Self-pay

## 2021-05-12 ENCOUNTER — Ambulatory Visit (INDEPENDENT_AMBULATORY_CARE_PROVIDER_SITE_OTHER): Payer: BC Managed Care – PPO | Admitting: Family Medicine

## 2021-05-12 ENCOUNTER — Encounter (INDEPENDENT_AMBULATORY_CARE_PROVIDER_SITE_OTHER): Payer: Self-pay | Admitting: Family Medicine

## 2021-05-12 VITALS — BP 102/67 | HR 75 | Temp 97.7°F | Ht 62.0 in | Wt 151.0 lb

## 2021-05-12 DIAGNOSIS — Z6827 Body mass index (BMI) 27.0-27.9, adult: Secondary | ICD-10-CM | POA: Diagnosis not present

## 2021-05-12 DIAGNOSIS — Z9189 Other specified personal risk factors, not elsewhere classified: Secondary | ICD-10-CM

## 2021-05-12 DIAGNOSIS — E669 Obesity, unspecified: Secondary | ICD-10-CM | POA: Diagnosis not present

## 2021-05-12 DIAGNOSIS — E559 Vitamin D deficiency, unspecified: Secondary | ICD-10-CM

## 2021-05-12 DIAGNOSIS — R7303 Prediabetes: Secondary | ICD-10-CM | POA: Diagnosis not present

## 2021-05-12 NOTE — Progress Notes (Signed)
Chief Complaint:   OBESITY Carmen Garza is here to discuss her progress with her obesity treatment plan along with follow-up of her obesity related diagnoses. Carmen Garza is on the Category 2 Plan and states she is following her eating plan approximately 50% of the time. Carmen Garza states she is doing cardio and weights for 30-45 minutes 3 times per week.  Today's visit was #: 22 Starting weight: 167 lbs Starting date: 05/13/2020 Today's weight: 151 lbs Today's date: 05/12/2021 Total lbs lost to date: 16 Total lbs lost since last in-office visit: 1  Interim History: Carmen Garza continues to do well with weight loss. She had extra challenges with taking care of her daughter and her grandkids. She is eating out a bit more, and she is trying to make smarter choices.   Subjective:   1. Vitamin D deficiency Carmen Garza is on Vit D OTC, and her level is not at goal. Her fatigue has improved. She is increasing strengthening exercise to help prevent osteoporosis. I discussed labs with the patient today.  2. Pre-diabetes Carmen Garza's A1c has improved from 5.8 to 5.5. she is doing well with diet and weight loss, and exercise. She has no signs of hypoglycemia. I discussed labs with the patient today.  3. At risk for heart disease Carmen Garza is at higher than average risk for cardiovascular disease due to obesity.  Assessment/Plan:   1. Vitamin D deficiency Carmen Garza will continue OTC Vitamin D as is, and we will recheck labs in 1 month. She will follow-up for routine testing of Vitamin D, at least 2-3 times per year to avoid over-replacement.  2. Pre-diabetes Carmen Garza will continue diet, exercise, and decreasing simple carbohydrates to help decrease the risk of diabetes. We will recheck labs in 1 month.  3. At risk for heart disease Carmen Garza was given approximately 15 minutes of coronary artery disease prevention counseling today. She is 56 y.o. female and has risk factors for heart disease including obesity. We discussed  intensive lifestyle modifications today with an emphasis on specific weight loss instructions and strategies.  Repetitive spaced learning was employed today to elicit superior memory formation and behavioral change.   4. Obesity BMI today is 27.7 Carmen Garza is currently in the action stage of change. As such, her goal is to continue with weight loss efforts. She has agreed to the Category 2 Plan.   Exercise goals: As is.  Behavioral modification strategies: increasing lean protein intake, decreasing eating out, and meal planning and cooking strategies.  Carmen Garza has agreed to follow-up with our clinic in 4 weeks. She was informed of the importance of frequent follow-up visits to maximize her success with intensive lifestyle modifications for her multiple health conditions.   Objective:   Blood pressure 102/67, pulse 75, temperature 97.7 F (36.5 C), height 5\' 2"  (1.575 m), weight 151 lb (68.5 kg), SpO2 99 %. Body mass index is 27.62 kg/m.  General: Cooperative, alert, well developed, in no acute distress. HEENT: Conjunctivae and lids unremarkable. Cardiovascular: Regular rhythm.  Lungs: Normal work of breathing. Neurologic: No focal deficits.   Lab Results  Component Value Date   CREATININE 0.53 (L) 03/09/2021   BUN 18 03/09/2021   NA 143 03/09/2021   K 5.2 03/09/2021   CL 103 03/09/2021   CO2 27 03/09/2021   Lab Results  Component Value Date   ALT 21 03/09/2021   AST 16 03/09/2021   ALKPHOS 70 03/09/2021   BILITOT 0.5 03/09/2021   Lab Results  Component Value Date  HGBA1C 5.5 03/09/2021   HGBA1C 5.5 10/13/2020   HGBA1C 5.8 (H) 05/13/2020   Lab Results  Component Value Date   INSULIN 11.9 03/09/2021   INSULIN 9.4 10/13/2020   INSULIN 17.7 05/13/2020   Lab Results  Component Value Date   TSH 1.110 05/13/2020   Lab Results  Component Value Date   CHOL 180 10/13/2020   HDL 59 10/13/2020   LDLCALC 102 (H) 10/13/2020   LDLDIRECT 143.6 07/22/2007   TRIG 106  10/13/2020   CHOLHDL 2.9 05/13/2020   Lab Results  Component Value Date   VD25OH 69.9 03/09/2021   VD25OH 77.9 10/13/2020   VD25OH 29.2 (L) 05/13/2020   Lab Results  Component Value Date   WBC 6.7 05/13/2020   HGB 14.1 05/13/2020   HCT 42.5 05/13/2020   MCV 90 05/13/2020   PLT 269 05/13/2020   Lab Results  Component Value Date   IRON 72 05/13/2020   TIBC 321 05/13/2020   FERRITIN 203 (H) 05/13/2020   Attestation Statements:   Reviewed by clinician on day of visit: allergies, medications, problem list, medical history, surgical history, family history, social history, and previous encounter notes.   I, Trixie Dredge, am acting as transcriptionist for Dennard Nip, MD.  I have reviewed the above documentation for accuracy and completeness, and I agree with the above. -  Dennard Nip, MD

## 2021-06-09 ENCOUNTER — Ambulatory Visit (INDEPENDENT_AMBULATORY_CARE_PROVIDER_SITE_OTHER): Payer: BC Managed Care – PPO | Admitting: Family Medicine

## 2021-06-09 ENCOUNTER — Other Ambulatory Visit: Payer: Self-pay

## 2021-06-09 ENCOUNTER — Encounter (INDEPENDENT_AMBULATORY_CARE_PROVIDER_SITE_OTHER): Payer: Self-pay | Admitting: Family Medicine

## 2021-06-09 VITALS — BP 101/68 | HR 68 | Temp 97.6°F | Ht 62.0 in | Wt 154.0 lb

## 2021-06-09 DIAGNOSIS — E669 Obesity, unspecified: Secondary | ICD-10-CM | POA: Diagnosis not present

## 2021-06-09 DIAGNOSIS — R7303 Prediabetes: Secondary | ICD-10-CM

## 2021-06-09 DIAGNOSIS — E559 Vitamin D deficiency, unspecified: Secondary | ICD-10-CM

## 2021-06-09 DIAGNOSIS — Z9189 Other specified personal risk factors, not elsewhere classified: Secondary | ICD-10-CM

## 2021-06-09 DIAGNOSIS — E7849 Other hyperlipidemia: Secondary | ICD-10-CM | POA: Diagnosis not present

## 2021-06-09 DIAGNOSIS — Z6828 Body mass index (BMI) 28.0-28.9, adult: Secondary | ICD-10-CM

## 2021-06-09 NOTE — Progress Notes (Signed)
? ? ? ?Chief Complaint:  ? ?OBESITY ?Carmen Garza is here to discuss her progress with her obesity treatment plan along with follow-up of her obesity related diagnoses. Carmen Garza is on the Category 2 Plan and states she is following her eating plan approximately 50% of the time. Carmen Garza states she is walking and lifting weights for 30 minutes 3 times per week. ? ?Today's visit was #: 20 ?Starting weight: 167 lbs ?Starting date: 05/13/2020 ?Today's weight: 154 lbs ?Today's date: 06/09/2021 ?Total lbs lost to date: 21 ?Total lbs lost since last in-office visit: 0 ? ?Interim History: Carmen Garza hasn't followed the plan as much as she has been in the past. She has been eating more calories recently due to cravings. She has been keeping her grandson over the past week. She would like to discuss options today. ? ?Subjective:  ? ?1. Pre-diabetes ?Carmen Garza's last A1c looked better at 5.5. ? ?2. Vitamin D deficiency ?Carmen Garza is taking Vit D 5,000 IU daily OTC. She denies nausea, vomiting, or muscle weakness. ? ?3. Other hyperlipidemia ?Carmen Garza has done well with weight loss. She has gotten off track since her last visit. ? ?4. At risk for heart disease ?Carmen Garza is at higher than average risk for cardiovascular disease due to obesity. ? ?Assessment/Plan:  ? ?1. Pre-diabetes ?Labs will be obtained today. Carmen Garza will continue to working on dietary changes, exercise, weight loss, and decreasing simple carbohydrates to help decrease the risk of diabetes.  ? ?- CBC with Differential/Platelet ?- CMP14+EGFR ?- Insulin, random ?- Hemoglobin A1c ?- TSH ? ?2. Vitamin D deficiency ?Labs will be obtained today. Carmen Garza will continue OTC Vitamin D and will follow-up for routine testing of Vitamin D, at least 2-3 times per year to avoid over-replacement. ? ?- VITAMIN D 25 Hydroxy (Vit-D Deficiency, Fractures) ? ?3. Other hyperlipidemia ?Cardiovascular risk and specific lipid/LDL goals reviewed. We discussed several lifestyle modifications today. Labs will be  obtained today. Carmen Garza will continue working on dietary changes, exercise, and weight loss efforts. Orders and follow up as documented in patient record.  ? ?- Lipid Panel With LDL/HDL Ratio ? ?4. At risk for heart disease ?Carmen Garza was given approximately 15 minutes of coronary artery disease prevention counseling today. She is 56 y.o. female and has risk factors for heart disease including obesity. We discussed intensive lifestyle modifications today with an emphasis on specific weight loss instructions and strategies. ? ?Repetitive spaced learning was employed today to elicit superior memory formation and behavioral change.  ? ?5. Obesity BMI today is 28.3 ?Carmen Garza is currently in the action stage of change. As such, her goal is to continue with weight loss efforts. She has agreed to start following a lower carbohydrate, vegetable and lean protein rich diet plan.  ? ?Exercise goals: As is. ? ?Behavioral modification strategies: increasing lean protein intake, increasing water intake, no skipping meals, and meal planning and cooking strategies. ? ?Carmen Garza has agreed to follow-up with our clinic in 3 weeks. She was informed of the importance of frequent follow-up visits to maximize her success with intensive lifestyle modifications for her multiple health conditions.  ? ?Carmen Garza was informed we would discuss her lab results at her next visit unless there is a critical issue that needs to be addressed sooner. Carmen Garza agreed to keep her next visit at the agreed upon time to discuss these results. ? ?Objective:  ? ?Blood pressure 101/68, pulse 68, temperature 97.6 ?F (36.4 ?C), height '5\' 2"'  (1.575 m), weight 154 lb (69.9 kg), SpO2 99 %. ?Body  mass index is 28.17 kg/m?. ? ?General: Cooperative, alert, well developed, in no acute distress. ?HEENT: Conjunctivae and lids unremarkable. ?Cardiovascular: Regular rhythm.  ?Lungs: Normal work of breathing. ?Neurologic: No focal deficits.  ? ?Lab Results  ?Component Value Date  ?  CREATININE 0.53 (L) 03/09/2021  ? BUN 18 03/09/2021  ? NA 143 03/09/2021  ? K 5.2 03/09/2021  ? CL 103 03/09/2021  ? CO2 27 03/09/2021  ? ?Lab Results  ?Component Value Date  ? ALT 21 03/09/2021  ? AST 16 03/09/2021  ? ALKPHOS 70 03/09/2021  ? BILITOT 0.5 03/09/2021  ? ?Lab Results  ?Component Value Date  ? HGBA1C 5.5 03/09/2021  ? HGBA1C 5.5 10/13/2020  ? HGBA1C 5.8 (H) 05/13/2020  ? ?Lab Results  ?Component Value Date  ? INSULIN 11.9 03/09/2021  ? INSULIN 9.4 10/13/2020  ? INSULIN 17.7 05/13/2020  ? ?Lab Results  ?Component Value Date  ? TSH 1.110 05/13/2020  ? ?Lab Results  ?Component Value Date  ? CHOL 180 10/13/2020  ? HDL 59 10/13/2020  ? LDLCALC 102 (H) 10/13/2020  ? LDLDIRECT 143.6 07/22/2007  ? TRIG 106 10/13/2020  ? CHOLHDL 2.9 05/13/2020  ? ?Lab Results  ?Component Value Date  ? VD25OH 69.9 03/09/2021  ? VD25OH 77.9 10/13/2020  ? VD25OH 29.2 (L) 05/13/2020  ? ?Lab Results  ?Component Value Date  ? WBC 6.7 05/13/2020  ? HGB 14.1 05/13/2020  ? HCT 42.5 05/13/2020  ? MCV 90 05/13/2020  ? PLT 269 05/13/2020  ? ?Lab Results  ?Component Value Date  ? IRON 72 05/13/2020  ? TIBC 321 05/13/2020  ? FERRITIN 203 (H) 05/13/2020  ? ?Attestation Statements:  ? ?Reviewed by clinician on day of visit: allergies, medications, problem list, medical history, surgical history, family history, social history, and previous encounter notes. ? ? ?I, Trixie Dredge, am acting as transcriptionist for Dennard Nip, MD. ? ?I have reviewed the above documentation for accuracy and completeness, and I agree with the above. -  Dennard Nip, MD ? ? ?

## 2021-06-10 LAB — CBC WITH DIFFERENTIAL/PLATELET
Basophils Absolute: 0.1 10*3/uL (ref 0.0–0.2)
Basos: 1 %
EOS (ABSOLUTE): 0.1 10*3/uL (ref 0.0–0.4)
Eos: 2 %
Hematocrit: 42.6 % (ref 34.0–46.6)
Hemoglobin: 14.2 g/dL (ref 11.1–15.9)
Immature Grans (Abs): 0 10*3/uL (ref 0.0–0.1)
Immature Granulocytes: 0 %
Lymphocytes Absolute: 2.2 10*3/uL (ref 0.7–3.1)
Lymphs: 46 %
MCH: 30.1 pg (ref 26.6–33.0)
MCHC: 33.3 g/dL (ref 31.5–35.7)
MCV: 90 fL (ref 79–97)
Monocytes Absolute: 0.5 10*3/uL (ref 0.1–0.9)
Monocytes: 10 %
Neutrophils Absolute: 2 10*3/uL (ref 1.4–7.0)
Neutrophils: 41 %
Platelets: 237 10*3/uL (ref 150–450)
RBC: 4.71 x10E6/uL (ref 3.77–5.28)
RDW: 12 % (ref 11.7–15.4)
WBC: 4.8 10*3/uL (ref 3.4–10.8)

## 2021-06-10 LAB — LIPID PANEL WITH LDL/HDL RATIO
Cholesterol, Total: 184 mg/dL (ref 100–199)
HDL: 64 mg/dL (ref >39)
LDL Chol Calc (NIH): 100 mg/dL — ABNORMAL HIGH (ref 0–99)
LDL/HDL Ratio: 1.6 ratio (ref 0.0–3.2)
Triglycerides: 114 mg/dL (ref 0–149)
VLDL Cholesterol Cal: 20 mg/dL (ref 5–40)

## 2021-06-10 LAB — CMP14+EGFR
ALT: 24 IU/L (ref 0–32)
AST: 18 IU/L (ref 0–40)
Albumin/Globulin Ratio: 1.8 (ref 1.2–2.2)
Albumin: 4.6 g/dL (ref 3.8–4.9)
Alkaline Phosphatase: 65 IU/L (ref 44–121)
BUN/Creatinine Ratio: 35 — ABNORMAL HIGH (ref 9–23)
BUN: 18 mg/dL (ref 6–24)
Bilirubin Total: 0.5 mg/dL (ref 0.0–1.2)
CO2: 26 mmol/L (ref 20–29)
Calcium: 9.5 mg/dL (ref 8.7–10.2)
Chloride: 102 mmol/L (ref 96–106)
Creatinine, Ser: 0.51 mg/dL — ABNORMAL LOW (ref 0.57–1.00)
Globulin, Total: 2.6 g/dL (ref 1.5–4.5)
Glucose: 96 mg/dL (ref 70–99)
Potassium: 4.8 mmol/L (ref 3.5–5.2)
Sodium: 141 mmol/L (ref 134–144)
Total Protein: 7.2 g/dL (ref 6.0–8.5)
eGFR: 111 mL/min/{1.73_m2} (ref >59)

## 2021-06-10 LAB — TSH: TSH: 1.1 u[IU]/mL (ref 0.450–4.500)

## 2021-06-10 LAB — HEMOGLOBIN A1C
Est. average glucose Bld gHb Est-mCnc: 111 mg/dL
Hgb A1c MFr Bld: 5.5 % (ref 4.8–5.6)

## 2021-06-10 LAB — INSULIN, RANDOM: INSULIN: 14.6 u[IU]/mL (ref 2.6–24.9)

## 2021-06-10 LAB — VITAMIN D 25 HYDROXY (VIT D DEFICIENCY, FRACTURES): Vit D, 25-Hydroxy: 67.3 ng/mL (ref 30.0–100.0)

## 2021-06-30 ENCOUNTER — Ambulatory Visit (INDEPENDENT_AMBULATORY_CARE_PROVIDER_SITE_OTHER): Payer: BC Managed Care – PPO | Admitting: Family Medicine

## 2021-06-30 ENCOUNTER — Other Ambulatory Visit: Payer: Self-pay

## 2021-06-30 ENCOUNTER — Encounter (INDEPENDENT_AMBULATORY_CARE_PROVIDER_SITE_OTHER): Payer: Self-pay | Admitting: Family Medicine

## 2021-06-30 VITALS — BP 110/68 | HR 68 | Temp 97.3°F | Ht 62.0 in | Wt 151.0 lb

## 2021-06-30 DIAGNOSIS — E669 Obesity, unspecified: Secondary | ICD-10-CM

## 2021-06-30 DIAGNOSIS — R7303 Prediabetes: Secondary | ICD-10-CM

## 2021-06-30 DIAGNOSIS — E559 Vitamin D deficiency, unspecified: Secondary | ICD-10-CM | POA: Diagnosis not present

## 2021-06-30 DIAGNOSIS — Z6827 Body mass index (BMI) 27.0-27.9, adult: Secondary | ICD-10-CM | POA: Diagnosis not present

## 2021-07-04 NOTE — Progress Notes (Signed)
? ? ? ?Chief Complaint:  ? ?OBESITY ?Jarelly is here to discuss her progress with her obesity treatment plan along with follow-up of her obesity related diagnoses. Kynsleigh is on following a lower carbohydrate, vegetable and lean protein rich diet plan and states she is following her eating plan approximately 80% of the time. Almarie states she is lifting weights and doing cardio for 30-40 minutes 4 times per week. ? ?Today's visit was #: 21 ?Starting weight: 167 lbs ?Starting date: 05/13/2020 ?Today's weight: 151 lbs ?Today's date: 06/30/2021 ?Total lbs lost to date: 25 ?Total lbs lost since last in-office visit: 3 ? ?Interim History: Jovonna has done well with weight loss. She tried the low carb plan for a bit and then went back to her Category 2 plan. She is working on increasing her water intake and she is trying to decrease snacking. ? ?Subjective:  ? ?1. Vitamin D deficiency ?Elise is on OTC Vit D. Her level is at goal, and she has no signs of over-replacement. I discussed labs with the patient today. ? ?2. Pre-diabetes ?Rayssa continues to do well with diet and weight loss. Her A1c is at goal. She denies hypoglycemia, and her polyphagia has improved. I discussed labs with the patient today. ? ?Assessment/Plan:  ? ?1. Vitamin D deficiency ?Josaphine will continue Vitamin D as is, and we will recheck labs in 3 months as she is at high risk of over-replacement. ? ?2. Pre-diabetes ?Roxene will continue with diet, exercise, and weight loss. ? ?3. Obesity BMI today is 27.7 ?Emely is currently in the action stage of change. As such, her goal is to continue with weight loss efforts. She has agreed to the Category 2 Plan.  ? ?Exercise goals: As is. ? ?Behavioral modification strategies: increasing lean protein intake. ? ?Nezzie has agreed to follow-up with our clinic in 4 weeks. She was informed of the importance of frequent follow-up visits to maximize her success with intensive lifestyle modifications for her multiple  health conditions.  ? ?Objective:  ? ?Blood pressure 110/68, pulse 68, temperature (!) 97.3 ?F (36.3 ?C), height '5\' 2"'$  (1.575 m), weight 151 lb (68.5 kg), SpO2 98 %. ?Body mass index is 27.62 kg/m?. ? ?General: Cooperative, alert, well developed, in no acute distress. ?HEENT: Conjunctivae and lids unremarkable. ?Cardiovascular: Regular rhythm.  ?Lungs: Normal work of breathing. ?Neurologic: No focal deficits.  ? ?Lab Results  ?Component Value Date  ? CREATININE 0.51 (L) 06/09/2021  ? BUN 18 06/09/2021  ? NA 141 06/09/2021  ? K 4.8 06/09/2021  ? CL 102 06/09/2021  ? CO2 26 06/09/2021  ? ?Lab Results  ?Component Value Date  ? ALT 24 06/09/2021  ? AST 18 06/09/2021  ? ALKPHOS 65 06/09/2021  ? BILITOT 0.5 06/09/2021  ? ?Lab Results  ?Component Value Date  ? HGBA1C 5.5 06/09/2021  ? HGBA1C 5.5 03/09/2021  ? HGBA1C 5.5 10/13/2020  ? HGBA1C 5.8 (H) 05/13/2020  ? ?Lab Results  ?Component Value Date  ? INSULIN 14.6 06/09/2021  ? INSULIN 11.9 03/09/2021  ? INSULIN 9.4 10/13/2020  ? INSULIN 17.7 05/13/2020  ? ?Lab Results  ?Component Value Date  ? TSH 1.100 06/09/2021  ? ?Lab Results  ?Component Value Date  ? CHOL 184 06/09/2021  ? HDL 64 06/09/2021  ? LDLCALC 100 (H) 06/09/2021  ? LDLDIRECT 143.6 07/22/2007  ? TRIG 114 06/09/2021  ? CHOLHDL 2.9 05/13/2020  ? ?Lab Results  ?Component Value Date  ? VD25OH 67.3 06/09/2021  ? VD25OH 69.9 03/09/2021  ?  VD25OH 77.9 10/13/2020  ? ?Lab Results  ?Component Value Date  ? WBC 4.8 06/09/2021  ? HGB 14.2 06/09/2021  ? HCT 42.6 06/09/2021  ? MCV 90 06/09/2021  ? PLT 237 06/09/2021  ? ?Lab Results  ?Component Value Date  ? IRON 72 05/13/2020  ? TIBC 321 05/13/2020  ? FERRITIN 203 (H) 05/13/2020  ? ?Attestation Statements:  ? ?Reviewed by clinician on day of visit: allergies, medications, problem list, medical history, surgical history, family history, social history, and previous encounter notes. ? ?Time spent on visit including pre-visit chart review and post-visit care and charting was 30  minutes.  ? ? ?I, Trixie Dredge, am acting as transcriptionist for Dennard Nip, MD. ? ?I have reviewed the above documentation for accuracy and completeness, and I agree with the above. -  Dennard Nip, MD ? ? ?

## 2021-07-07 ENCOUNTER — Ambulatory Visit (INDEPENDENT_AMBULATORY_CARE_PROVIDER_SITE_OTHER): Payer: BC Managed Care – PPO | Admitting: Family Medicine

## 2021-07-27 ENCOUNTER — Encounter (INDEPENDENT_AMBULATORY_CARE_PROVIDER_SITE_OTHER): Payer: Self-pay | Admitting: Nurse Practitioner

## 2021-07-27 ENCOUNTER — Ambulatory Visit (INDEPENDENT_AMBULATORY_CARE_PROVIDER_SITE_OTHER): Payer: BC Managed Care – PPO | Admitting: Nurse Practitioner

## 2021-07-27 VITALS — BP 101/65 | Ht 62.0 in | Wt 151.0 lb

## 2021-07-27 DIAGNOSIS — E669 Obesity, unspecified: Secondary | ICD-10-CM

## 2021-07-27 DIAGNOSIS — E8881 Metabolic syndrome: Secondary | ICD-10-CM | POA: Diagnosis not present

## 2021-07-27 DIAGNOSIS — Z6827 Body mass index (BMI) 27.0-27.9, adult: Secondary | ICD-10-CM

## 2021-07-27 DIAGNOSIS — E559 Vitamin D deficiency, unspecified: Secondary | ICD-10-CM

## 2021-08-03 NOTE — Progress Notes (Signed)
? ? ? ?Chief Complaint:  ? ?OBESITY ?Carmen Garza is here to discuss her progress with her obesity treatment plan along with follow-up of her obesity related diagnoses. Carmen Garza is on the Category 2 Plan and states she is following her eating plan approximately 75% of the time. Carmen Garza states she is doing cardio and weights for 30 to 45 minutes 4 times per week. ? ?Today's visit was #: 40 ?Starting weight: 167 lbs ?Starting date: 05/13/2020 ?Today's weight: 151 lbs ?Today's date: 07/27/2021 ?Total lbs lost to date: 30 ?Total lbs lost since last in-office visit: 0 ? ?Interim History: Carmen Garza is overall doing well. She is struggling with snacking and she is craving salt and sweets. Carmen Garza is not interested in starting medications at this time. She is drinking water, coffee, and 2 diet sodas. ? ?Subjective:  ? ?1. Vitamin D deficiency ?Carmen Garza is taking vitamin D 5,000IU daily. Her last Dexa was 11/02/20. She denies any side effects, such as nausea, vomiting, or muscle weakness. ? ?2. Insulin resistance ?Carmen Garza's last Insulin was 14.6 and her last A1c was 5.5. She has never been on medication. ? ?Assessment/Plan:  ? ?1. Vitamin D deficiency ?Low Vitamin D level contributes to fatigue and are associated with obesity, breast, and colon cancer. She agrees to continue to take Vitamin D as directed and will follow-up for routine testing of Vitamin D, at least 2-3 times per year to avoid over-replacement. ? ?2. Insulin resistance ?Carmen Garza was given information on insulin resistance and metformin. She will continue to work on weight loss, exercise, and decreasing simple carbohydrates to help decrease the risk of diabetes. Carmen Garza agreed to follow-up with [name] as directed to closely monitor her progress. ? ?3. Obesity BMI today is 27.6 ?Carmen Garza was given the Protein Content of Food handout. ? ?Carmen Garza is currently in the action stage of change. As such, her goal is to continue with weight loss efforts. She has agreed to the Category 2 Plan.   ? ?Exercise goals:  As is. ? ?Behavioral modification strategies: increasing lean protein intake, increasing water intake, no skipping meals, and keeping a strict food journal and we weill review at the next visit. ? ?Carmen Garza has agreed to follow-up with our clinic in 4 weeks with IC prior to next visit. She was informed of the importance of frequent follow-up visits to maximize her success with intensive lifestyle modifications for her multiple health conditions.  ? ?Objective:  ? ?Blood pressure 101/65, height '5\' 2"'$  (1.575 m), weight 151 lb (68.5 kg). ?Body mass index is 27.62 kg/m?. ? ?General: Cooperative, alert, well developed, in no acute distress. ?HEENT: Conjunctivae and lids unremarkable. ?Cardiovascular: Regular rhythm.  ?Lungs: Normal work of breathing. ?Neurologic: No focal deficits.  ? ?Lab Results  ?Component Value Date  ? CREATININE 0.51 (L) 06/09/2021  ? BUN 18 06/09/2021  ? NA 141 06/09/2021  ? K 4.8 06/09/2021  ? CL 102 06/09/2021  ? CO2 26 06/09/2021  ? ?Lab Results  ?Component Value Date  ? ALT 24 06/09/2021  ? AST 18 06/09/2021  ? ALKPHOS 65 06/09/2021  ? BILITOT 0.5 06/09/2021  ? ?Lab Results  ?Component Value Date  ? HGBA1C 5.5 06/09/2021  ? HGBA1C 5.5 03/09/2021  ? HGBA1C 5.5 10/13/2020  ? HGBA1C 5.8 (H) 05/13/2020  ? ?Lab Results  ?Component Value Date  ? INSULIN 14.6 06/09/2021  ? INSULIN 11.9 03/09/2021  ? INSULIN 9.4 10/13/2020  ? INSULIN 17.7 05/13/2020  ? ?Lab Results  ?Component Value Date  ? TSH 1.100 06/09/2021  ? ?  Lab Results  ?Component Value Date  ? CHOL 184 06/09/2021  ? HDL 64 06/09/2021  ? LDLCALC 100 (H) 06/09/2021  ? LDLDIRECT 143.6 07/22/2007  ? TRIG 114 06/09/2021  ? CHOLHDL 2.9 05/13/2020  ? ?Lab Results  ?Component Value Date  ? VD25OH 67.3 06/09/2021  ? VD25OH 69.9 03/09/2021  ? VD25OH 77.9 10/13/2020  ? ?Lab Results  ?Component Value Date  ? WBC 4.8 06/09/2021  ? HGB 14.2 06/09/2021  ? HCT 42.6 06/09/2021  ? MCV 90 06/09/2021  ? PLT 237 06/09/2021  ? ?Lab Results   ?Component Value Date  ? IRON 72 05/13/2020  ? TIBC 321 05/13/2020  ? FERRITIN 203 (H) 05/13/2020  ? ?Attestation Statements:  ? ?Reviewed by clinician on day of visit: allergies, medications, problem list, medical history, surgical history, family history, social history, and previous encounter notes. ? ?Time spent on visit including pre-visit chart review and post-visit care and charting was 30 minutes.  ? ?I, Marcille Blanco, CMA, am acting as transcriptionist for Everardo Pacific, [place] ? ?I have reviewed the above documentation for accuracy and completeness, and I agree with the above. Everardo Pacific, FNP  ?

## 2021-08-23 ENCOUNTER — Ambulatory Visit (INDEPENDENT_AMBULATORY_CARE_PROVIDER_SITE_OTHER): Payer: BC Managed Care – PPO | Admitting: Family Medicine

## 2021-08-25 ENCOUNTER — Other Ambulatory Visit: Payer: Self-pay | Admitting: Family Medicine

## 2021-08-25 DIAGNOSIS — Z1231 Encounter for screening mammogram for malignant neoplasm of breast: Secondary | ICD-10-CM

## 2021-08-29 ENCOUNTER — Ambulatory Visit (INDEPENDENT_AMBULATORY_CARE_PROVIDER_SITE_OTHER): Payer: BC Managed Care – PPO | Admitting: Family Medicine

## 2021-09-01 ENCOUNTER — Encounter (INDEPENDENT_AMBULATORY_CARE_PROVIDER_SITE_OTHER): Payer: Self-pay | Admitting: Nurse Practitioner

## 2021-09-01 ENCOUNTER — Ambulatory Visit (INDEPENDENT_AMBULATORY_CARE_PROVIDER_SITE_OTHER): Payer: BC Managed Care – PPO | Admitting: Nurse Practitioner

## 2021-09-01 VITALS — BP 95/65 | HR 68 | Temp 97.4°F | Ht 62.0 in | Wt 152.0 lb

## 2021-09-01 DIAGNOSIS — E8881 Metabolic syndrome: Secondary | ICD-10-CM

## 2021-09-01 DIAGNOSIS — R0602 Shortness of breath: Secondary | ICD-10-CM | POA: Diagnosis not present

## 2021-09-01 DIAGNOSIS — E669 Obesity, unspecified: Secondary | ICD-10-CM | POA: Diagnosis not present

## 2021-09-01 DIAGNOSIS — Z6827 Body mass index (BMI) 27.0-27.9, adult: Secondary | ICD-10-CM

## 2021-09-02 ENCOUNTER — Ambulatory Visit
Admission: RE | Admit: 2021-09-02 | Discharge: 2021-09-02 | Disposition: A | Discharge status: Home / Self Care | Payer: BC Managed Care – PPO | Source: Ambulatory Visit | Attending: Family Medicine | Admitting: Family Medicine

## 2021-09-02 DIAGNOSIS — Z1231 Encounter for screening mammogram for malignant neoplasm of breast: Secondary | ICD-10-CM

## 2021-09-08 NOTE — Progress Notes (Signed)
Chief Complaint:   OBESITY Carmen Garza is here to discuss her progress with her obesity treatment plan along with follow-up of her obesity related diagnoses. Carmen Garza is on the Category 2 Plan and states she is following her eating plan approximately 75% of the time. Carmen Garza states she is doing cardio and weights for 30 minutes 3 times per week.  Today's visit was #: 23 Starting weight: 167 lbs Starting date: 05/13/2020 Today's weight: 152 lbs Today's date: 09/01/2021 Total lbs lost to date: 15 lbs Total lbs lost since last in-office visit: 0  Interim History: Carmen Garza feels she is meeting her protein goals. She feels she is eating too many calories. Her daughter in law was pregnant with twins and lost one of the twins. She started stress eating. She feels she is now getting back on track. She is drinking water, coffee and 2 diet sodas. She is starting to work out with a Physiological scientist tonight once per week and will increase going to the gym 4 days per week. She will consider Category 1 at next visit based upon how she is doing. She is currently struggling with hunger and I don't want to decrease her calories at this time.   Subjective:   1. Insulin resistance Carmen Garza has never been on medication. She is struggling with hunger and cravings.   2. SOBOE (shortness of breath on exertion) Carmen Garza notices shortness of breath more with climbing stairs. Her IC on 05/13/2020 was 1773, IC on 09/01/2021 was 1339.   Assessment/Plan:   1. Insulin resistance [name] will continue to work on weight loss, exercise, and decreasing simple carbohydrates to help decrease the risk of diabetes. [name] agreed to follow-up with [name] as directed to closely monitor her progress. [name] was given information on Metformin.   2. SOBOE (shortness of breath on exertion) [name] does feel that she gets out of breath more easily that she used to when she exercises. [name]'s shortness of breath appears to be obesity related and  exercise induced. She has agreed to work on weight loss and gradually increase exercise to treat her exercise induced shortness of breath. IC was obtained today. Results was reviewed with Carmen Garza. Will continue to monitor closely.   3. Obesity BMI today is 27.8 Carmen Garza is currently in the action stage of change. As such, her goal is to continue with weight loss efforts. She has agreed to the Category 2 Plan.   Exercise goals:  As is.   Behavioral modification strategies: increasing lean protein intake, increasing water intake, and no skipping meals.  Carmen Garza has agreed to follow-up with our clinic in 4 weeks. She was informed of the importance of frequent follow-up visits to maximize her success with intensive lifestyle modifications for her multiple health conditions.   Objective:   Blood pressure 95/65, pulse 68, temperature (!) 97.4 F (36.3 C), temperature source Oral, height '5\' 2"'$  (1.575 m), weight 152 lb (68.9 kg), SpO2 100 %. Body mass index is 27.8 kg/m.  General: Cooperative, alert, well developed, in no acute distress. HEENT: Conjunctivae and lids unremarkable. Cardiovascular: Regular rhythm.  Lungs: Normal work of breathing. Neurologic: No focal deficits.   Lab Results  Component Value Date   CREATININE 0.51 (L) 06/09/2021   BUN 18 06/09/2021   NA 141 06/09/2021   K 4.8 06/09/2021   CL 102 06/09/2021   CO2 26 06/09/2021   Lab Results  Component Value Date   ALT 24 06/09/2021   AST 18 06/09/2021   ALKPHOS  65 06/09/2021   BILITOT 0.5 06/09/2021   Lab Results  Component Value Date   HGBA1C 5.5 06/09/2021   HGBA1C 5.5 03/09/2021   HGBA1C 5.5 10/13/2020   HGBA1C 5.8 (H) 05/13/2020   Lab Results  Component Value Date   INSULIN 14.6 06/09/2021   INSULIN 11.9 03/09/2021   INSULIN 9.4 10/13/2020   INSULIN 17.7 05/13/2020   Lab Results  Component Value Date   TSH 1.100 06/09/2021   Lab Results  Component Value Date   CHOL 184 06/09/2021   HDL 64 06/09/2021    LDLCALC 100 (H) 06/09/2021   LDLDIRECT 143.6 07/22/2007   TRIG 114 06/09/2021   CHOLHDL 2.9 05/13/2020   Lab Results  Component Value Date   VD25OH 67.3 06/09/2021   VD25OH 69.9 03/09/2021   VD25OH 77.9 10/13/2020   Lab Results  Component Value Date   WBC 4.8 06/09/2021   HGB 14.2 06/09/2021   HCT 42.6 06/09/2021   MCV 90 06/09/2021   PLT 237 06/09/2021   Lab Results  Component Value Date   IRON 72 05/13/2020   TIBC 321 05/13/2020   FERRITIN 203 (H) 05/13/2020   Attestation Statements:   Reviewed by clinician on day of visit: allergies, medications, problem list, medical history, surgical history, family history, social history, and previous encounter notes.  Time spent on visit including pre-visit chart review and post-visit care and charting was 30 minutes.   I, Lizbeth Bark, RMA, am acting as Location manager for Everardo Pacific, FNP.   I have reviewed the above documentation for accuracy and completeness, and I agree with the above. Everardo Pacific, FNP

## 2021-10-04 ENCOUNTER — Ambulatory Visit (INDEPENDENT_AMBULATORY_CARE_PROVIDER_SITE_OTHER): Payer: BC Managed Care – PPO | Admitting: Nurse Practitioner

## 2021-10-04 VITALS — BP 98/66 | HR 78 | Temp 97.5°F | Ht 62.0 in | Wt 151.0 lb

## 2021-10-04 DIAGNOSIS — Z6827 Body mass index (BMI) 27.0-27.9, adult: Secondary | ICD-10-CM

## 2021-10-04 DIAGNOSIS — E669 Obesity, unspecified: Secondary | ICD-10-CM

## 2021-10-04 DIAGNOSIS — E8881 Metabolic syndrome: Secondary | ICD-10-CM | POA: Diagnosis not present

## 2021-10-04 DIAGNOSIS — R638 Other symptoms and signs concerning food and fluid intake: Secondary | ICD-10-CM | POA: Diagnosis not present

## 2021-10-05 NOTE — Progress Notes (Signed)
Chief Complaint:   OBESITY Carmen Garza is here to discuss her progress with her obesity treatment plan along with follow-up of her obesity related diagnoses. Carmen Garza is on the Category 2 Plan and states she is following her eating plan approximately 75% of the time. Carmen Garza states she is doing 0 minutes 0 times per week.  Today's visit was #: 24 Starting weight: 167 lbs Starting date: 05/13/2020 Today's weight: 151 lbs Today's date: 10/04/2021 Total lbs lost to date: 16 Total lbs lost since last in-office visit: 1  Interim History: [name] is doing well on her Category 2 plan.  She does not want to change plans at this time.  Her husband has been sick and she has been helping with him.  She went to 2 weddings and went on vacation since her last visit.  She is helping to care for her aunt with dementia.  She still has cravings, but notes that they are getting better.  Subjective:   1. Insulin resistance Carmen Garza is not on medications.  She is struggling with cravings but doing better.  2. Abnormal craving Carmen Garza is struggling with cravings and stress eating.  Assessment/Plan:   1. Insulin resistance We discussed metformin, and side effects were discussed.  Carmen Garza will continue to work on weight loss, exercise, and decreasing simple carbohydrates to help decrease the risk of diabetes. Carmen Garza agreed to follow-up with [name] as directed to closely monitor her progress.  2. Abnormal craving We discussed Wellbutrin and Topamax.  Side effects were discussed.  3. Obesity BMI today is 27.7 [name] is currently in the action stage of change. As such, her goal is to continue with weight loss efforts. She has agreed to the Category 2 Plan.   We discussed metformin, Topamax, and Wellbutrin.  SE discussed   Exercise goals: No exercise has been prescribed at this time.  Behavioral modification strategies: increasing water intake, meal planning and cooking strategies, and planning for success.  Carmen Garza  has agreed to follow-up with our clinic in 4 weeks. She was informed of the importance of frequent follow-up visits to maximize her success with intensive lifestyle modifications for her multiple health conditions.   Objective:   Blood pressure 98/66, pulse 78, temperature (!) 97.5 F (36.4 C), height '5\' 2"'$  (1.575 m), weight 151 lb (68.5 kg), SpO2 97 %. Body mass index is 27.62 kg/m.  General: Cooperative, alert, well developed, in no acute distress. HEENT: Conjunctivae and lids unremarkable. Cardiovascular: Regular rhythm.  Lungs: Normal work of breathing. Neurologic: No focal deficits.   Lab Results  Component Value Date   CREATININE 0.51 (L) 06/09/2021   BUN 18 06/09/2021   NA 141 06/09/2021   K 4.8 06/09/2021   CL 102 06/09/2021   CO2 26 06/09/2021   Lab Results  Component Value Date   ALT 24 06/09/2021   AST 18 06/09/2021   ALKPHOS 65 06/09/2021   BILITOT 0.5 06/09/2021   Lab Results  Component Value Date   HGBA1C 5.5 06/09/2021   HGBA1C 5.5 03/09/2021   HGBA1C 5.5 10/13/2020   HGBA1C 5.8 (H) 05/13/2020   Lab Results  Component Value Date   INSULIN 14.6 06/09/2021   INSULIN 11.9 03/09/2021   INSULIN 9.4 10/13/2020   INSULIN 17.7 05/13/2020   Lab Results  Component Value Date   TSH 1.100 06/09/2021   Lab Results  Component Value Date   CHOL 184 06/09/2021   HDL 64 06/09/2021   LDLCALC 100 (H) 06/09/2021   LDLDIRECT 143.6  07/22/2007   TRIG 114 06/09/2021   CHOLHDL 2.9 05/13/2020   Lab Results  Component Value Date   VD25OH 67.3 06/09/2021   VD25OH 69.9 03/09/2021   VD25OH 77.9 10/13/2020   Lab Results  Component Value Date   WBC 4.8 06/09/2021   HGB 14.2 06/09/2021   HCT 42.6 06/09/2021   MCV 90 06/09/2021   PLT 237 06/09/2021   Lab Results  Component Value Date   IRON 72 05/13/2020   TIBC 321 05/13/2020   FERRITIN 203 (H) 05/13/2020   Attestation Statements:   Reviewed by clinician on day of visit: allergies, medications, problem  list, medical history, surgical history, family history, social history, and previous encounter notes.  Time spent on visit including pre-visit chart review and post-visit care and charting was 30 minutes.   Wilhemena Durie, am acting as Location manager for [employer], FNP-C.  I have reviewed the above documentation for accuracy and completeness, and I agree with the above. Everardo Pacific, FNP

## 2021-10-06 ENCOUNTER — Encounter (INDEPENDENT_AMBULATORY_CARE_PROVIDER_SITE_OTHER): Payer: Self-pay | Admitting: Nurse Practitioner

## 2021-10-06 ENCOUNTER — Other Ambulatory Visit (INDEPENDENT_AMBULATORY_CARE_PROVIDER_SITE_OTHER): Payer: Self-pay | Admitting: Nurse Practitioner

## 2021-10-06 MED ORDER — BUPROPION HCL ER (SR) 150 MG PO TB12: 150.0000 mg | ORAL_TABLET | Freq: Every day | ORAL | 0 refills | Status: DC

## 2021-10-06 NOTE — Telephone Encounter (Signed)
Please advise 

## 2021-11-03 ENCOUNTER — Ambulatory Visit (INDEPENDENT_AMBULATORY_CARE_PROVIDER_SITE_OTHER): Payer: BC Managed Care – PPO | Admitting: Nurse Practitioner

## 2021-11-03 ENCOUNTER — Encounter (INDEPENDENT_AMBULATORY_CARE_PROVIDER_SITE_OTHER): Payer: Self-pay | Admitting: Nurse Practitioner

## 2021-11-03 VITALS — BP 94/61 | Ht 62.0 in | Wt 150.0 lb

## 2021-11-03 DIAGNOSIS — R638 Other symptoms and signs concerning food and fluid intake: Secondary | ICD-10-CM | POA: Diagnosis not present

## 2021-11-03 DIAGNOSIS — Z6827 Body mass index (BMI) 27.0-27.9, adult: Secondary | ICD-10-CM

## 2021-11-03 DIAGNOSIS — E669 Obesity, unspecified: Secondary | ICD-10-CM

## 2021-11-03 MED ORDER — BUPROPION HCL ER (SR) 150 MG PO TB12: 150.0000 mg | ORAL_TABLET | Freq: Two times a day (BID) | ORAL | 0 refills | Status: DC

## 2021-11-08 NOTE — Progress Notes (Signed)
Chief Complaint:   OBESITY Carmen Garza is here to discuss her progress with her obesity treatment plan along with follow-up of her obesity related diagnoses. Carmen Garza is on the Category 2 Plan and states she is following her eating plan approximately 50% of the time. Carmen Garza states she is exercising 0 minutes 0 times per week.  Today's visit was #: 25 Starting weight: 197 lbs Starting date: 05/13/2020 Today's weight: 150 lbs Today's date: 11/03/2021 Total lbs lost to date: 47 lbs Total lbs lost since last in-office visit: 1  Interim History: Carmen Garza is surprised that she lost weight since her last visit. Since her last visit she went to a conference and celebrated her grandson's birthday. Her highest weight was 197 lbs. She is still struggling with cravings. Not drinking enough water and has not been able to go to the gym. Her God son passed away recently.  Subjective:   1. Abnormal craving Carmen Garza is currently taking Wellbutrin SR 150 mg. Denies any side effects. She can not tell if it is helping with cravings.  Assessment/Plan:   1. Abnormal craving INCREASE Wellbutrin SR to 150 mg twice a day. Refill for 1 month with 0 refills.  Side effects discussed.   -Refill/Increase buPROPion (WELLBUTRIN SR) 150 MG 12 hr tablet; Take 1 tablet (150 mg total) by mouth 2 (two) times daily.  Dispense: 60 tablet; Refill: 0  2. Obesity BMI today is 27.5 Carmen Garza is currently in the action stage of change. As such, her goal is to continue with weight loss efforts. She has agreed to the Category 2 Plan.   Exercise goals: All adults should avoid inactivity. Some physical activity is better than none, and adults who participate in any amount of physical activity gain some health benefits.  Behavioral modification strategies: increasing lean protein intake, increasing water intake, and no skipping meals.  Carmen Garza has agreed to follow-up with our clinic in 4 weeks. She was informed of the importance of  frequent follow-up visits to maximize her success with intensive lifestyle modifications for her multiple health conditions.   Objective:   Blood pressure 94/61, height '5\' 2"'$  (1.575 m), weight 150 lb (68 kg). Body mass index is 27.44 kg/m.  General: Cooperative, alert, well developed, in no acute distress. HEENT: Conjunctivae and lids unremarkable. Cardiovascular: Regular rhythm.  Lungs: Normal work of breathing. Neurologic: No focal deficits.   Lab Results  Component Value Date   CREATININE 0.51 (L) 06/09/2021   BUN 18 06/09/2021   NA 141 06/09/2021   K 4.8 06/09/2021   CL 102 06/09/2021   CO2 26 06/09/2021   Lab Results  Component Value Date   ALT 24 06/09/2021   AST 18 06/09/2021   ALKPHOS 65 06/09/2021   BILITOT 0.5 06/09/2021   Lab Results  Component Value Date   HGBA1C 5.5 06/09/2021   HGBA1C 5.5 03/09/2021   HGBA1C 5.5 10/13/2020   HGBA1C 5.8 (H) 05/13/2020   Lab Results  Component Value Date   INSULIN 14.6 06/09/2021   INSULIN 11.9 03/09/2021   INSULIN 9.4 10/13/2020   INSULIN 17.7 05/13/2020   Lab Results  Component Value Date   TSH 1.100 06/09/2021   Lab Results  Component Value Date   CHOL 184 06/09/2021   HDL 64 06/09/2021   LDLCALC 100 (H) 06/09/2021   LDLDIRECT 143.6 07/22/2007   TRIG 114 06/09/2021   CHOLHDL 2.9 05/13/2020   Lab Results  Component Value Date   VD25OH 67.3 06/09/2021   VD25OH 69.9  03/09/2021   VD25OH 77.9 10/13/2020   Lab Results  Component Value Date   WBC 4.8 06/09/2021   HGB 14.2 06/09/2021   HCT 42.6 06/09/2021   MCV 90 06/09/2021   PLT 237 06/09/2021   Lab Results  Component Value Date   IRON 72 05/13/2020   TIBC 321 05/13/2020   FERRITIN 203 (H) 05/13/2020   Attestation Statements:   Reviewed by clinician on day of visit: allergies, medications, problem list, medical history, surgical history, family history, social history, and previous encounter notes.  I, Brendell Tyus, RMA, am acting as  transcriptionist for Everardo Pacific, FNP.  I have reviewed the above documentation for accuracy and completeness, and I agree with the above. Everardo Pacific, FNP

## 2021-11-16 ENCOUNTER — Encounter (INDEPENDENT_AMBULATORY_CARE_PROVIDER_SITE_OTHER): Payer: Self-pay

## 2021-11-24 ENCOUNTER — Other Ambulatory Visit (INDEPENDENT_AMBULATORY_CARE_PROVIDER_SITE_OTHER): Payer: Self-pay | Admitting: Nurse Practitioner

## 2021-11-24 DIAGNOSIS — R638 Other symptoms and signs concerning food and fluid intake: Secondary | ICD-10-CM

## 2021-12-06 ENCOUNTER — Ambulatory Visit (INDEPENDENT_AMBULATORY_CARE_PROVIDER_SITE_OTHER): Payer: BC Managed Care – PPO | Admitting: Nurse Practitioner

## 2021-12-06 ENCOUNTER — Encounter (INDEPENDENT_AMBULATORY_CARE_PROVIDER_SITE_OTHER): Payer: Self-pay | Admitting: Nurse Practitioner

## 2021-12-06 VITALS — BP 96/65 | HR 72 | Temp 97.9°F | Ht 62.0 in | Wt 152.0 lb

## 2021-12-06 DIAGNOSIS — R638 Other symptoms and signs concerning food and fluid intake: Secondary | ICD-10-CM

## 2021-12-06 DIAGNOSIS — E669 Obesity, unspecified: Secondary | ICD-10-CM | POA: Diagnosis not present

## 2021-12-06 DIAGNOSIS — Z6827 Body mass index (BMI) 27.0-27.9, adult: Secondary | ICD-10-CM | POA: Diagnosis not present

## 2021-12-06 MED ORDER — BUPROPION HCL ER (SR) 150 MG PO TB12: 150.0000 mg | ORAL_TABLET | Freq: Two times a day (BID) | ORAL | 0 refills | Status: DC

## 2021-12-15 DIAGNOSIS — R638 Other symptoms and signs concerning food and fluid intake: Secondary | ICD-10-CM | POA: Insufficient documentation

## 2021-12-15 NOTE — Progress Notes (Signed)
Chief Complaint:   OBESITY Carmen Garza is here to discuss her progress with her obesity treatment plan along with follow-up of her obesity related diagnoses. Carmen Garza is on the Category 2 Plan and states she is following her eating plan approximately 25% of the time. Carmen Garza states she is not exercising.  Today's visit was #: 25 Starting weight: 167 lbs Starting date: 05/13/2020 Today's weight: 152 lbs Today's date: 12/06/2021 Total lbs lost to date: 15 lbs Total lbs lost since last in-office visit: +2 lbs  Interim History: Struggling with stress eating. Her twin grandchildren are due September 12th.  Today she is trying to figure out the next best plan for care for her aunt.  She had a baby shower and her God son's memorial since her last visit.  Drinking water, but not as much as she should.   Subjective:   1. Abnormal craving Taking Wellbutrin SR 150 mg BID.  Denies side effects.  Not sure if it's helping.  Struggling with stress eating and making good choices.   Assessment/Plan:   1. Abnormal craving Refill - buPROPion (WELLBUTRIN SR) 150 MG 12 hr tablet; Take 1 tablet (150 mg total) by mouth 2 (two) times daily.  Dispense: 60 tablet; Refill: 0 Side effects discussed.   2. Obesity BMI today is 27.9 Carmen Garza is currently in the action stage of change. As such, her goal is to continue with weight loss efforts. She has agreed to the Category 2 Plan.   Exercise goals: All adults should avoid inactivity. Some physical activity is better than none, and adults who participate in any amount of physical activity gain some health benefits.  Behavioral modification strategies: increasing lean protein intake, increasing vegetables, increasing water intake, and planning for success.  Carmen Garza has agreed to follow-up with our clinic in 4 weeks. She was informed of the importance of frequent follow-up visits to maximize her success with intensive lifestyle modifications for her multiple health  conditions.   Objective:   Blood pressure 96/65, pulse 72, temperature 97.9 F (36.6 C), height '5\' 2"'$  (1.575 m), weight 152 lb (68.9 kg), SpO2 98 %. Body mass index is 27.8 kg/m.  General: Cooperative, alert, well developed, in no acute distress. HEENT: Conjunctivae and lids unremarkable. Cardiovascular: Regular rhythm.  Lungs: Normal work of breathing. Neurologic: No focal deficits.   Lab Results  Component Value Date   CREATININE 0.51 (L) 06/09/2021   BUN 18 06/09/2021   NA 141 06/09/2021   K 4.8 06/09/2021   CL 102 06/09/2021   CO2 26 06/09/2021   Lab Results  Component Value Date   ALT 24 06/09/2021   AST 18 06/09/2021   ALKPHOS 65 06/09/2021   BILITOT 0.5 06/09/2021   Lab Results  Component Value Date   HGBA1C 5.5 06/09/2021   HGBA1C 5.5 03/09/2021   HGBA1C 5.5 10/13/2020   HGBA1C 5.8 (H) 05/13/2020   Lab Results  Component Value Date   INSULIN 14.6 06/09/2021   INSULIN 11.9 03/09/2021   INSULIN 9.4 10/13/2020   INSULIN 17.7 05/13/2020   Lab Results  Component Value Date   TSH 1.100 06/09/2021   Lab Results  Component Value Date   CHOL 184 06/09/2021   HDL 64 06/09/2021   LDLCALC 100 (H) 06/09/2021   LDLDIRECT 143.6 07/22/2007   TRIG 114 06/09/2021   CHOLHDL 2.9 05/13/2020   Lab Results  Component Value Date   VD25OH 67.3 06/09/2021   VD25OH 69.9 03/09/2021   VD25OH 77.9 10/13/2020  Lab Results  Component Value Date   WBC 4.8 06/09/2021   HGB 14.2 06/09/2021   HCT 42.6 06/09/2021   MCV 90 06/09/2021   PLT 237 06/09/2021   Lab Results  Component Value Date   IRON 72 05/13/2020   TIBC 321 05/13/2020   FERRITIN 203 (H) 05/13/2020    Attestation Statements:   Reviewed by clinician on day of visit: allergies, medications, problem list, medical history, surgical history, family history, social history, and previous encounter notes.  I, Davy Pique, RMA, am acting as transcriptionist for Everardo Pacific, FNP  I have reviewed  the above documentation for accuracy and completeness, and I agree with the above. Everardo Pacific, FNP

## 2021-12-27 ENCOUNTER — Other Ambulatory Visit (INDEPENDENT_AMBULATORY_CARE_PROVIDER_SITE_OTHER): Payer: Self-pay | Admitting: Nurse Practitioner

## 2021-12-27 DIAGNOSIS — R638 Other symptoms and signs concerning food and fluid intake: Secondary | ICD-10-CM

## 2022-01-03 ENCOUNTER — Ambulatory Visit (INDEPENDENT_AMBULATORY_CARE_PROVIDER_SITE_OTHER): Payer: BC Managed Care – PPO | Admitting: Family Medicine

## 2022-01-24 ENCOUNTER — Ambulatory Visit (INDEPENDENT_AMBULATORY_CARE_PROVIDER_SITE_OTHER): Payer: BC Managed Care – PPO | Admitting: Family Medicine

## 2022-02-13 ENCOUNTER — Encounter (INDEPENDENT_AMBULATORY_CARE_PROVIDER_SITE_OTHER): Payer: Self-pay | Admitting: Internal Medicine

## 2022-02-13 ENCOUNTER — Ambulatory Visit (INDEPENDENT_AMBULATORY_CARE_PROVIDER_SITE_OTHER): Payer: BC Managed Care – PPO | Admitting: Internal Medicine

## 2022-02-13 VITALS — BP 114/70 | HR 75 | Temp 97.7°F | Ht 62.0 in | Wt 161.0 lb

## 2022-02-13 DIAGNOSIS — E88819 Insulin resistance, unspecified: Secondary | ICD-10-CM

## 2022-02-13 DIAGNOSIS — Z6829 Body mass index (BMI) 29.0-29.9, adult: Secondary | ICD-10-CM

## 2022-02-13 DIAGNOSIS — E559 Vitamin D deficiency, unspecified: Secondary | ICD-10-CM | POA: Diagnosis not present

## 2022-02-13 DIAGNOSIS — E669 Obesity, unspecified: Secondary | ICD-10-CM | POA: Diagnosis not present

## 2022-02-27 NOTE — Progress Notes (Signed)
Chief Complaint:   OBESITY Carmen Garza is here to discuss her progress with her obesity treatment plan along with follow-up of her obesity related diagnoses. Carmen Garza is on the Category 2 Plan and states she is following her eating plan approximately 0% of the time. Carmen Garza states she is not currently exercising.  Today's visit was #: 26 Starting weight: 167 lbs Starting date: 05/13/2020 Today's weight: 161 lbs Today's date: 02/13/2022 Total lbs lost to date: 6 Total lbs lost since last in-office visit: +9  Interim History: Carmen Garza encountered some family problems, which threw off her routine. She notes an increase in convenience foods and decreased going to the gym. Pt is not taking bupropion. She had a lapse of several days.   Subjective:   1. Insulin resistance Discussed labs with patient today. Diagnosis may contribute to cravings and fatigue. Pt's insulin level was 14.6 on last check.  2. Vitamin D deficiency Discussed labs with patient today. Levels replenished.  Assessment/Plan:   1. Insulin resistance Weight loss therapy. Consider Metformin if cravings for carbs are not emotionally driven.  2. Vitamin D deficiency Continue Vitamin D supplementation. Recheck levels in 2 weeks.  3. Obesity BMI today is 29.5 Carmen Garza is currently in the action stage of change. As such, her goal is to continue with weight loss efforts. She has agreed to the Category 2 Plan.   Avoid the "all or none" mindset. Prioritize your health. Hold off on bupropion until you are back on plan and reaching protein targets. If emotional eating behaviors begin, restart medication.  Exercise goals:  Resume exercise.  Behavioral modification strategies: increasing water intake, decreasing eating out, no skipping meals, emotional eating strategies, and planning for success.  Carmen Garza has agreed to follow-up with our clinic in 2 weeks. She was informed of the importance of frequent follow-up visits to maximize her  success with intensive lifestyle modifications for her multiple health conditions.   Objective:   Blood pressure 114/70, pulse 75, temperature 97.7 F (36.5 C), height '5\' 2"'$  (1.575 m), weight 161 lb (73 kg), SpO2 99 %. Body mass index is 29.45 kg/m.  General: Cooperative, alert, well developed, in no acute distress. HEENT: Conjunctivae and lids unremarkable. Cardiovascular: Regular rhythm.  Lungs: Normal work of breathing. Neurologic: No focal deficits.   Lab Results  Component Value Date   CREATININE 0.51 (L) 06/09/2021   BUN 18 06/09/2021   NA 141 06/09/2021   K 4.8 06/09/2021   CL 102 06/09/2021   CO2 26 06/09/2021   Lab Results  Component Value Date   ALT 24 06/09/2021   AST 18 06/09/2021   ALKPHOS 65 06/09/2021   BILITOT 0.5 06/09/2021   Lab Results  Component Value Date   HGBA1C 5.5 06/09/2021   HGBA1C 5.5 03/09/2021   HGBA1C 5.5 10/13/2020   HGBA1C 5.8 (H) 05/13/2020   Lab Results  Component Value Date   INSULIN 14.6 06/09/2021   INSULIN 11.9 03/09/2021   INSULIN 9.4 10/13/2020   INSULIN 17.7 05/13/2020   Lab Results  Component Value Date   TSH 1.100 06/09/2021   Lab Results  Component Value Date   CHOL 184 06/09/2021   HDL 64 06/09/2021   LDLCALC 100 (H) 06/09/2021   LDLDIRECT 143.6 07/22/2007   TRIG 114 06/09/2021   CHOLHDL 2.9 05/13/2020   Lab Results  Component Value Date   VD25OH 67.3 06/09/2021   VD25OH 69.9 03/09/2021   VD25OH 77.9 10/13/2020   Lab Results  Component Value Date  WBC 4.8 06/09/2021   HGB 14.2 06/09/2021   HCT 42.6 06/09/2021   MCV 90 06/09/2021   PLT 237 06/09/2021   Lab Results  Component Value Date   IRON 72 05/13/2020   TIBC 321 05/13/2020   FERRITIN 203 (H) 05/13/2020    Attestation Statements:   Reviewed by clinician on day of visit: allergies, medications, problem list, medical history, surgical history, family history, social history, and previous encounter notes.  Time spent on visit including  pre-visit chart review and post-visit care and charting was 20 minutes.   I, Kathlene November, BS, CMA, am acting as transcriptionist for Thomes Dinning, MD.   I have reviewed the above documentation for accuracy and completeness, and I agree with the above. -Thomes Dinning, MD

## 2022-03-06 ENCOUNTER — Ambulatory Visit (INDEPENDENT_AMBULATORY_CARE_PROVIDER_SITE_OTHER): Payer: BC Managed Care – PPO | Admitting: Internal Medicine

## 2022-03-06 ENCOUNTER — Encounter (INDEPENDENT_AMBULATORY_CARE_PROVIDER_SITE_OTHER): Payer: Self-pay | Admitting: Internal Medicine

## 2022-03-06 VITALS — BP 110/75 | HR 77 | Temp 98.3°F | Ht 62.0 in | Wt 163.2 lb

## 2022-03-06 DIAGNOSIS — E669 Obesity, unspecified: Secondary | ICD-10-CM

## 2022-03-06 DIAGNOSIS — R638 Other symptoms and signs concerning food and fluid intake: Secondary | ICD-10-CM | POA: Diagnosis not present

## 2022-03-06 DIAGNOSIS — Z6829 Body mass index (BMI) 29.0-29.9, adult: Secondary | ICD-10-CM | POA: Diagnosis not present

## 2022-03-06 MED ORDER — BUPROPION HCL ER (SR) 150 MG PO TB12: 150.0000 mg | ORAL_TABLET | Freq: Two times a day (BID) | ORAL | 0 refills | Status: DC

## 2022-03-15 NOTE — Progress Notes (Signed)
Chief Complaint:   OBESITY Carmen Garza is here to discuss her progress with her obesity treatment plan along with follow-up of her obesity related diagnoses. Carmen Garza is on the Category 2 Plan and states she is following her eating plan approximately 25% of the time. Carmen Garza states she is doing 0 minutes 0 times per week.  Today's visit was #: 22 Starting weight: 167 lbs Starting date: 05/13/2020 Today's weight: 163 lbs Today's date: 03/06/2022 Total lbs lost to date: 4 Total lbs lost since last in-office visit: 0  Interim History: Carmen Garza is slowly getting back on track. She has arranged for her family to help care for her mother. She feels that now she can focus on her health. She plans to start going to the gym 4-5 times per week. Review of of trends show weight increase since April due to caregiver stress and decreased adherence to her nutritional plan.   Subjective:   1. Abnormal craving Carmen Garza has not been taking bupropion since August. We reviewed benefits and side effects. She is agreeable to start the medication.   Assessment/Plan:   1. Abnormal craving Carmen Garza agreed to restart bupropion 150 mg BID with no refills.   - buPROPion (WELLBUTRIN SR) 150 MG 12 hr tablet; Take 1 tablet (150 mg total) by mouth 2 (two) times daily.  Dispense: 60 tablet; Refill: 0  2. Obesity BMI today is 29.8 Carmen Garza is currently in the action stage of change. As such, her goal is to continue with weight loss efforts. She has agreed to the Category 2 Plan.   Patient is to work on implementing reduced calorie meal plan as before. Restart bupropion to assist with cravings and abnormal hunger signals.   Exercise goals: Start gym exercise 4-5 times per week.   Behavioral modification strategies: increasing lean protein intake, decreasing simple carbohydrates, increasing water intake, no skipping meals, meal planning and cooking strategies, keeping healthy foods in the home, and emotional eating  strategies.  Carmen Garza has agreed to follow-up with our clinic in 2 to 3 weeks. She was informed of the importance of frequent follow-up visits to maximize her success with intensive lifestyle modifications for her multiple health conditions.   Objective:   Blood pressure 110/75, pulse 77, temperature 98.3 F (36.8 C), height '5\' 2"'$  (1.575 m), weight 163 lb 3.2 oz (74 kg), SpO2 98 %. Body mass index is 29.85 kg/m.  General: Cooperative, alert, well developed, in no acute distress. HEENT: Conjunctivae and lids unremarkable. Cardiovascular: Regular rhythm.  Lungs: Normal work of breathing. Neurologic: No focal deficits.   Lab Results  Component Value Date   CREATININE 0.51 (L) 06/09/2021   BUN 18 06/09/2021   NA 141 06/09/2021   K 4.8 06/09/2021   CL 102 06/09/2021   CO2 26 06/09/2021   Lab Results  Component Value Date   ALT 24 06/09/2021   AST 18 06/09/2021   ALKPHOS 65 06/09/2021   BILITOT 0.5 06/09/2021   Lab Results  Component Value Date   HGBA1C 5.5 06/09/2021   HGBA1C 5.5 03/09/2021   HGBA1C 5.5 10/13/2020   HGBA1C 5.8 (H) 05/13/2020   Lab Results  Component Value Date   INSULIN 14.6 06/09/2021   INSULIN 11.9 03/09/2021   INSULIN 9.4 10/13/2020   INSULIN 17.7 05/13/2020   Lab Results  Component Value Date   TSH 1.100 06/09/2021   Lab Results  Component Value Date   CHOL 184 06/09/2021   HDL 64 06/09/2021   LDLCALC 100 (H) 06/09/2021  LDLDIRECT 143.6 07/22/2007   TRIG 114 06/09/2021   CHOLHDL 2.9 05/13/2020   Lab Results  Component Value Date   VD25OH 67.3 06/09/2021   VD25OH 69.9 03/09/2021   VD25OH 77.9 10/13/2020   Lab Results  Component Value Date   WBC 4.8 06/09/2021   HGB 14.2 06/09/2021   HCT 42.6 06/09/2021   MCV 90 06/09/2021   PLT 237 06/09/2021   Lab Results  Component Value Date   IRON 72 05/13/2020   TIBC 321 05/13/2020   FERRITIN 203 (H) 05/13/2020   Attestation Statements:   Reviewed by clinician on day of visit:  allergies, medications, problem list, medical history, surgical history, family history, social history, and previous encounter notes.  Time spent on visit including pre-visit chart review and post-visit care and charting was 20 minutes.   Wilhemena Durie, am acting as transcriptionist for Thomes Dinning, MD.  I have reviewed the above documentation for accuracy and completeness, and I agree with the above. -Thomes Dinning, MD

## 2022-03-23 ENCOUNTER — Ambulatory Visit (INDEPENDENT_AMBULATORY_CARE_PROVIDER_SITE_OTHER): Payer: BC Managed Care – PPO | Admitting: Internal Medicine

## 2022-03-23 ENCOUNTER — Encounter (INDEPENDENT_AMBULATORY_CARE_PROVIDER_SITE_OTHER): Payer: Self-pay | Admitting: Internal Medicine

## 2022-03-23 VITALS — BP 113/77 | HR 74 | Temp 98.0°F | Ht 62.0 in | Wt 161.0 lb

## 2022-03-23 DIAGNOSIS — E88819 Insulin resistance, unspecified: Secondary | ICD-10-CM | POA: Diagnosis not present

## 2022-03-23 DIAGNOSIS — Z6829 Body mass index (BMI) 29.0-29.9, adult: Secondary | ICD-10-CM

## 2022-03-23 DIAGNOSIS — R638 Other symptoms and signs concerning food and fluid intake: Secondary | ICD-10-CM

## 2022-03-23 DIAGNOSIS — E669 Obesity, unspecified: Secondary | ICD-10-CM | POA: Diagnosis not present

## 2022-03-23 MED ORDER — BUPROPION HCL ER (SR) 150 MG PO TB12: 150.0000 mg | ORAL_TABLET | Freq: Two times a day (BID) | ORAL | 0 refills | Status: DC

## 2022-03-23 NOTE — Progress Notes (Signed)
Chief Complaint:   OBESITY Carmen Garza is here to discuss her progress with her obesity treatment plan along with follow-up of her obesity related diagnoses. Carmen Garza is on the Category 2 Plan and states she is following her eating plan approximately 60% of the time. Carmen Garza states she is doing weights and cardio 30-40 minutes 3-4 times per week.  Today's visit was #: 28 Starting weight: 167 lbs Starting date: 05/13/2020 Today's weight: 161 lbs Today's date: 03/23/2022 Total lbs lost to date: 6 Total lbs lost since last in-office visit: 2  Interim History: Carmen Garza presents today for follow-up.  She reports doing well has actually started going to the gym about 3-4 times per week she is doing cardio and weights and has been feeling well.  She has started to prioritize her health.  Her family is supportive.  She reports adequate satiety and satiation on meal plan.  She has occasional cravings for carbs but denies any abnormal eating patterns.  She asked for advice about eating during the holidays.  She has also increased her consumption of water.  She was restarted on bupropion and feels that medication is helping her with her mood as well as cravings.  She has noticed less cravings. She will be do for labs in March 2024.  Subjective:   1. Abnormal craving Symptoms improved on bupropion with no side effects. Pt's BP is well controlled.  2. Insulin resistance Diagnosis may contribute to carb cravings.  Assessment/Plan:   1. Abnormal craving Continue weight loss therapy and bupropion 150 mg BID.  Refill- buPROPion (WELLBUTRIN SR) 150 MG 12 hr tablet; Take 1 tablet (150 mg total) by mouth 2 (two) times daily.  Dispense: 60 tablet; Refill: 0  2. Insulin resistance Weight loss therapy, maintaining low consumption of simple sugars.  3. Obesity BMI today is 29.6 Carmen Garza is currently in the action stage of change. As such, her goal is to continue with weight loss efforts. She has agreed to the  Category 2 Plan.   Exercise goals:  As is    Behavioral modification strategies: increasing lean protein intake, no skipping meals, and holiday eating strategies .  Carmen Garza has agreed to follow-up with our clinic in 3 weeks. She was informed of the importance of frequent follow-up visits to maximize her success with intensive lifestyle modifications for her multiple health conditions.   Objective:   Blood pressure 113/77, pulse 74, temperature 98 F (36.7 C), height '5\' 2"'$  (1.575 m), weight 161 lb (73 kg), SpO2 99 %. Body mass index is 29.45 kg/m.  General: Cooperative, alert, well developed, in no acute distress. HEENT: Conjunctivae and lids unremarkable. Cardiovascular: Regular rhythm.  Lungs: Normal work of breathing. Neurologic: No focal deficits.   Lab Results  Component Value Date   CREATININE 0.51 (L) 06/09/2021   BUN 18 06/09/2021   NA 141 06/09/2021   K 4.8 06/09/2021   CL 102 06/09/2021   CO2 26 06/09/2021   Lab Results  Component Value Date   ALT 24 06/09/2021   AST 18 06/09/2021   ALKPHOS 65 06/09/2021   BILITOT 0.5 06/09/2021   Lab Results  Component Value Date   HGBA1C 5.5 06/09/2021   HGBA1C 5.5 03/09/2021   HGBA1C 5.5 10/13/2020   HGBA1C 5.8 (H) 05/13/2020   Lab Results  Component Value Date   INSULIN 14.6 06/09/2021   INSULIN 11.9 03/09/2021   INSULIN 9.4 10/13/2020   INSULIN 17.7 05/13/2020   Lab Results  Component Value Date  TSH 1.100 06/09/2021   Lab Results  Component Value Date   CHOL 184 06/09/2021   HDL 64 06/09/2021   LDLCALC 100 (H) 06/09/2021   LDLDIRECT 143.6 07/22/2007   TRIG 114 06/09/2021   CHOLHDL 2.9 05/13/2020   Lab Results  Component Value Date   VD25OH 67.3 06/09/2021   VD25OH 69.9 03/09/2021   VD25OH 77.9 10/13/2020   Lab Results  Component Value Date   WBC 4.8 06/09/2021   HGB 14.2 06/09/2021   HCT 42.6 06/09/2021   MCV 90 06/09/2021   PLT 237 06/09/2021   Lab Results  Component Value Date   IRON 72  05/13/2020   TIBC 321 05/13/2020   FERRITIN 203 (H) 05/13/2020    Attestation Statements:   Reviewed by clinician on day of visit: allergies, medications, problem list, medical history, surgical history, family history, social history, and previous encounter notes.  Time spent on visit including pre-visit chart review and post-visit care and charting was 20 minutes.   I, Kathlene November, BS, CMA, am acting as transcriptionist for Thomes Dinning, MD.  I have reviewed the above documentation for accuracy and completeness, and I agree with the above. -Thomes Dinning, MD

## 2022-04-13 ENCOUNTER — Encounter (INDEPENDENT_AMBULATORY_CARE_PROVIDER_SITE_OTHER): Payer: Self-pay | Admitting: Internal Medicine

## 2022-04-13 ENCOUNTER — Ambulatory Visit (INDEPENDENT_AMBULATORY_CARE_PROVIDER_SITE_OTHER): Payer: BC Managed Care – PPO | Admitting: Internal Medicine

## 2022-04-13 VITALS — BP 127/84 | HR 82 | Temp 98.0°F | Ht 62.0 in | Wt 161.0 lb

## 2022-04-13 DIAGNOSIS — R638 Other symptoms and signs concerning food and fluid intake: Secondary | ICD-10-CM

## 2022-04-13 DIAGNOSIS — E669 Obesity, unspecified: Secondary | ICD-10-CM

## 2022-04-13 DIAGNOSIS — E88819 Insulin resistance, unspecified: Secondary | ICD-10-CM

## 2022-04-13 DIAGNOSIS — Z6829 Body mass index (BMI) 29.0-29.9, adult: Secondary | ICD-10-CM | POA: Diagnosis not present

## 2022-04-13 MED ORDER — BUPROPION HCL ER (SR) 150 MG PO TB12: 150.0000 mg | ORAL_TABLET | Freq: Two times a day (BID) | ORAL | 0 refills | Status: DC

## 2022-04-13 NOTE — Progress Notes (Signed)
Chief Complaint:   OBESITY Carmen Garza is here to discuss her progress with her obesity treatment plan along with follow-up of her obesity related diagnoses. Carmen Garza is on the Category 2 Plan and states she is following her eating plan approximately 60% of the time. Carmen Garza states she is doing cardio and weights for 30-40 minutes 2-3 times per week.  Today's visit was #: 69 Starting weight: 167 lbs Starting date: 05/13/2020 Today's weight: 161 lbs Today's date: 04/13/2022 Total lbs lost to date: 6 Total lbs lost since last in-office visit: 0  Interim History: Carmen Garza presents today for follow-up.  Since last office visit she has remained weight neutral.  She acknowledges some deviation from meal plan during the holidays but still managed to make healthy choices.  She would like to reach 149 pounds by March.  She is starting exercise program at the gym.  Her husband and family have been supportive.  She is getting 7 to 8 hours of sleep and reports her stress levels 7 out of 10 and manageable.  She is on bupropion for abnormal cravings.  She reports medication has been helpful and denies any adverse effects.  Blood pressure is adequately controlled.  Subjective:   1. Abnormal craving Improved on bupropion with no side effects.   2. Insulin resistance Last insulin level was 14.6 and may contribute to cravings. Normal limits A1c in the past. I discussed labs with the patient today.   Assessment/Plan:   1. Abnormal craving Continue weight loss therapy and bupropion, and we will refill for 1 month.   - buPROPion (WELLBUTRIN SR) 150 MG 12 hr tablet; Take 1 tablet (150 mg total) by mouth 2 (two) times daily.  Dispense: 60 tablet; Refill: 0  2. Insulin resistance Continue weight loss therapy, bupropion for cravings, and we will recheck insulin level in March.   3. Obesity BMI today is 29.5 Carmen Garza is currently in the action stage of change. As such, her goal is to continue with weight loss  efforts. She has agreed to the Category 2 Plan.   Exercise goals: As is.   Behavioral modification strategies: increasing lean protein intake, increasing water intake, no skipping meals, meal planning and cooking strategies, avoiding temptations, and planning for success.  Carmen Garza has agreed to follow-up with our clinic in 3 to 4 weeks. She was informed of the importance of frequent follow-up visits to maximize her success with intensive lifestyle modifications for her multiple health conditions.   Objective:   Blood pressure 127/84, pulse 82, temperature 98 F (36.7 C), height '5\' 2"'$  (1.575 m), weight 161 lb (73 kg), SpO2 100 %. Body mass index is 29.45 kg/m.  General: Cooperative, alert, well developed, in no acute distress. HEENT: Conjunctivae and lids unremarkable. Cardiovascular: Regular rhythm.  Lungs: Normal work of breathing. Neurologic: No focal deficits.   Lab Results  Component Value Date   CREATININE 0.51 (L) 06/09/2021   BUN 18 06/09/2021   NA 141 06/09/2021   K 4.8 06/09/2021   CL 102 06/09/2021   CO2 26 06/09/2021   Lab Results  Component Value Date   ALT 24 06/09/2021   AST 18 06/09/2021   ALKPHOS 65 06/09/2021   BILITOT 0.5 06/09/2021   Lab Results  Component Value Date   HGBA1C 5.5 06/09/2021   HGBA1C 5.5 03/09/2021   HGBA1C 5.5 10/13/2020   HGBA1C 5.8 (H) 05/13/2020   Lab Results  Component Value Date   INSULIN 14.6 06/09/2021   INSULIN 11.9 03/09/2021  INSULIN 9.4 10/13/2020   INSULIN 17.7 05/13/2020   Lab Results  Component Value Date   TSH 1.100 06/09/2021   Lab Results  Component Value Date   CHOL 184 06/09/2021   HDL 64 06/09/2021   LDLCALC 100 (H) 06/09/2021   LDLDIRECT 143.6 07/22/2007   TRIG 114 06/09/2021   CHOLHDL 2.9 05/13/2020   Lab Results  Component Value Date   VD25OH 67.3 06/09/2021   VD25OH 69.9 03/09/2021   VD25OH 77.9 10/13/2020   Lab Results  Component Value Date   WBC 4.8 06/09/2021   HGB 14.2 06/09/2021    HCT 42.6 06/09/2021   MCV 90 06/09/2021   PLT 237 06/09/2021   Lab Results  Component Value Date   IRON 72 05/13/2020   TIBC 321 05/13/2020   FERRITIN 203 (H) 05/13/2020   Attestation Statements:   Reviewed by clinician on day of visit: allergies, medications, problem list, medical history, surgical history, family history, social history, and previous encounter notes.   Wilhemena Durie, am acting as transcriptionist for Thomes Dinning, MD.  I have reviewed the above documentation for accuracy and completeness, and I agree with the above. -Thomes Dinning, MD

## 2022-05-04 ENCOUNTER — Ambulatory Visit (INDEPENDENT_AMBULATORY_CARE_PROVIDER_SITE_OTHER): Payer: BC Managed Care – PPO | Admitting: Internal Medicine

## 2022-05-04 ENCOUNTER — Encounter (INDEPENDENT_AMBULATORY_CARE_PROVIDER_SITE_OTHER): Payer: Self-pay | Admitting: Internal Medicine

## 2022-05-04 VITALS — BP 104/69 | HR 80 | Temp 97.7°F | Ht 62.0 in | Wt 161.0 lb

## 2022-05-04 DIAGNOSIS — Z6829 Body mass index (BMI) 29.0-29.9, adult: Secondary | ICD-10-CM

## 2022-05-04 DIAGNOSIS — E669 Obesity, unspecified: Secondary | ICD-10-CM | POA: Diagnosis not present

## 2022-05-04 DIAGNOSIS — R7303 Prediabetes: Secondary | ICD-10-CM | POA: Diagnosis not present

## 2022-05-04 DIAGNOSIS — R638 Other symptoms and signs concerning food and fluid intake: Secondary | ICD-10-CM | POA: Diagnosis not present

## 2022-05-04 DIAGNOSIS — Z6836 Body mass index (BMI) 36.0-36.9, adult: Secondary | ICD-10-CM

## 2022-05-04 DIAGNOSIS — E559 Vitamin D deficiency, unspecified: Secondary | ICD-10-CM | POA: Diagnosis not present

## 2022-05-04 NOTE — Assessment & Plan Note (Signed)
Improved etiology includes neurohormonal and psychological.  She will continue bupropion.  She is not having any side effects.  Continue with medical nutrition therapy and behavioral techniques.

## 2022-05-04 NOTE — Assessment & Plan Note (Signed)
Her most recent vitamin D levels were in the 60s.  She has been taking vitamin D 5000 units daily.  She is having blood work done by her PCP I have asked her to have them check her vitamin D levels to make sure she is not over supplementing.  If her levels are still in the 60s she may be able to reduce to every other day.  Vitamin D deficiency is associated with excess adiposity and may result in leptin resistance and adipogenesis.

## 2022-05-04 NOTE — Progress Notes (Addendum)
Office: [phone]  /  Fax: [phone]  WEIGHT SUMMARY AND BIOMETRICS  Medical Weight Loss Height: '5\' 2"'$  (1.575 m) Weight: 164 lb (74.4 kg) Temp: 97.7 F (36.5 C) Pulse Rate: 80 BP: 104/69 SpO2: 99 % Fasting: no Labs: no Today's Visit #: 30 Weight at Last VIsit: 161 lb Weight Lost Since Last Visit: 0  Body Fat %: 39.4 % Fat Mass (lbs): 64.8 lbs Muscle Mass (lbs): 94.6 lbs Total Body Water (lbs): 66 lbs Visceral Fat Rating : 9 Starting Date: 05/12/20 Starting Weight: 197 lb Total Weight Loss (lbs): 33 lb (15 kg)    HPI  Chief Complaint: OBESITY  Carmen Garza is here to discuss her progress with her obesity treatment plan. She is on the the Category 2 Plan and states she is following her eating plan approximately 30 % of the time. She states she is not exercising.   Interval History: Since last office she reports had sinus infection for 2 weeks so did not go to gym.  She acknowledges deviating from prescribed nutritional plan at some meals.  She denies problems with satiety, hunger or abnormal cravings.  She denies consumption of liquid calories and will be starting gym next week.  Carmen Garza's peak weight had been 197 with a nadir of 150 she started to experience some weight regain as of July of last year likely due to metabolic adaptations.  Her BIA shows a body fat percentage of 39 with a visceral fat rating of 9.  Pharmacotherapy: buproprion  PHYSICAL EXAM:  Blood pressure 104/69, pulse 80, temperature 97.7 F (36.5 C), height '5\' 2"'$  (1.575 m), weight 161 lb (73 kg), SpO2 99 %. Body mass index is 29.45 kg/m.  General: She is overweight, cooperative, alert, well developed, and in no acute distress. PSYCH: Has normal mood, affect and thought process.   HEENT: EOMI, sclerae are anicteric. Lungs: Normal breathing effort, no conversational dyspnea. Extremities: No edema.  Neurologic: No gross sensory or motor deficits. No tremors or fasciculations noted.    DIAGNOSTIC  DATA REVIEWED:  BMET    Component Value Date/Time   NA 141 06/09/2021 0740   K 4.8 06/09/2021 0740   CL 102 06/09/2021 0740   CO2 26 06/09/2021 0740   GLUCOSE 96 06/09/2021 0740   GLUCOSE 97 07/05/2011 1034   BUN 18 06/09/2021 0740   CREATININE 0.51 (L) 06/09/2021 0740   CALCIUM 9.5 06/09/2021 0740   GFRNONAA 107 05/13/2020 0852   GFRAA 123 05/13/2020 0852   Lab Results  Component Value Date   HGBA1C 5.5 06/09/2021   HGBA1C 5.8 (H) 05/13/2020   Lab Results  Component Value Date   INSULIN 14.6 06/09/2021   INSULIN 17.7 05/13/2020   CBC    Component Value Date/Time   WBC 4.8 06/09/2021 0740   WBC 7.9 03/25/2012 1112   WBC 14.2 (H) 06/24/2009 0512   RBC 4.71 06/09/2021 0740   RBC 4.65 03/25/2012 1112   RBC 4.36 06/24/2009 0512   HGB 14.2 06/09/2021 0740   HGB 14.0 03/25/2012 1112   HCT 42.6 06/09/2021 0740   HCT 41.7 03/25/2012 1112   PLT 237 06/09/2021 0740   MCV 90 06/09/2021 0740   MCV 89.7 03/25/2012 1112   MCH 30.1 06/09/2021 0740   MCH 30.2 03/25/2012 1112   MCHC 33.3 06/09/2021 0740   MCHC 33.6 03/25/2012 1112   MCHC 33.4 06/24/2009 0512   RDW 12.0 06/09/2021 0740   RDW 12.8 03/25/2012 1112   Iron/TIBC/Ferritin/ %Sat    Component Value  Date/Time   IRON 72 05/13/2020 0852   TIBC 321 05/13/2020 0852   FERRITIN 203 (H) 05/13/2020 0852   IRONPCTSAT 22 05/13/2020 0852   Lipid Panel     Component Value Date/Time   CHOL 184 06/09/2021 0740   TRIG 114 06/09/2021 0740   HDL 64 06/09/2021 0740   CHOLHDL 2.9 05/13/2020 0852   CHOLHDL 4.4 CALC 07/22/2007 1008   VLDL 17 07/22/2007 1008   LDLCALC 100 (H) 06/09/2021 0740   LDLDIRECT 143.6 07/22/2007 1008   Hepatic Function Panel     Component Value Date/Time   PROT 7.2 06/09/2021 0740   ALBUMIN 4.6 06/09/2021 0740   AST 18 06/09/2021 0740   ALT 24 06/09/2021 0740   ALKPHOS 65 06/09/2021 0740   BILITOT 0.5 06/09/2021 0740   BILIDIR 0.2 07/22/2007 1008      Component Value Date/Time   TSH 1.100  06/09/2021 0740     ASSESSMENT AND PLAN  TREATMENT PLAN FOR OBESITY:  Recommended Dietary Goals  Carmen Garza is currently in the action stage of change. As such, her goal is to continue with weight loss efforts. She has agreed to the Category 2 Plan.  Behavioral Intervention  We discussed the following Behavioral Modification Strategies today: increasing lean protein intake, increasing water intake, no skipping meals, meal planning and cooking strategies, keeping healthy foods in the home, and planning for success.  Additional resources provided today: NA  Recommended Physical Activity Goals  Carmen Garza has been instructed to work up to 150 minutes of moderate intensity aerobic activity a week and strengthening exercises 2-3 times per week for cardiovascular health, weight loss maintenance and preservation of muscle mass. Will start going to gym next week.   She has agreed to increase physical activity in their day and reduce sedentary time (increase NEAT).    Pharmacotherapy We discussed various medication options to help Carmen Garza with her weight loss efforts and we both agreed to continue bupropion.  ASSOCIATED CONDITIONS ADDRESSED TODAY  Abnormal craving Assessment & Plan: Improved etiology includes neurohormonal and psychological.  She will continue bupropion.  She is not having any side effects.  Continue with medical nutrition therapy and behavioral techniques.   Pre-diabetes Assessment & Plan: Her initial A1c had been 5.8 is down to 5.5.  She also had mildly elevated insulin levels.  She will be having blood work done at her PCPs have asked that they add insulin levels.   This may contribute to abnormal cravings, fatigue and diabetes complications without having diabetes. She may also be a candidate for incretin therapy with metformin.  This will be considered if she she reaches a plateau.    Obesity BMI with current  BMI 29.5  Vitamin D deficiency Assessment & Plan: Her  most recent vitamin D levels were in the 60s.  She has been taking vitamin D 5000 units daily.  She is having blood work done by her PCP I have asked her to have them check her vitamin D levels to make sure she is not over supplementing.  If her levels are still in the 60s she may be able to reduce to every other day.  Vitamin D deficiency is associated with excess adiposity and may result in leptin resistance and adipogenesis.       Return in about 3 weeks (around 05/25/2022) for For Weight Mangement with Dr. Gerarda Fraction.Marland Kitchen She was informed of the importance of frequent follow up visits to maximize her success with intensive lifestyle modifications for her multiple health conditions.  ATTESTASTION STATEMENTS:  Reviewed by clinician on day of visit: allergies, medications, problem list, medical history, surgical history, family history, social history, and previous encounter notes.   Time spent on visit including pre-visit chart review and post-visit care and charting was 30 minutes.    Thomes Dinning, MD

## 2022-05-04 NOTE — Assessment & Plan Note (Signed)
Her initial A1c had been 5.8 is down to 5.5.  She also had mildly elevated insulin levels.  She will be having blood work done at her PCPs have asked that they add insulin levels.   This may contribute to abnormal cravings, fatigue and diabetes complications without having diabetes. She may also be a candidate for incretin therapy with metformin.  This will be considered if she she reaches a plateau.

## 2022-05-10 DIAGNOSIS — Z8543 Personal history of malignant neoplasm of ovary: Secondary | ICD-10-CM | POA: Diagnosis not present

## 2022-05-10 DIAGNOSIS — Z0000 Encounter for general adult medical examination without abnormal findings: Secondary | ICD-10-CM | POA: Diagnosis not present

## 2022-05-10 DIAGNOSIS — M816 Localized osteoporosis [Lequesne]: Secondary | ICD-10-CM | POA: Diagnosis not present

## 2022-05-10 DIAGNOSIS — E88819 Insulin resistance, unspecified: Secondary | ICD-10-CM | POA: Diagnosis not present

## 2022-05-10 DIAGNOSIS — E7800 Pure hypercholesterolemia, unspecified: Secondary | ICD-10-CM | POA: Diagnosis not present

## 2022-05-18 ENCOUNTER — Other Ambulatory Visit: Payer: Self-pay | Admitting: Family Medicine

## 2022-05-18 DIAGNOSIS — M8588 Other specified disorders of bone density and structure, other site: Secondary | ICD-10-CM

## 2022-05-29 ENCOUNTER — Encounter (INDEPENDENT_AMBULATORY_CARE_PROVIDER_SITE_OTHER): Payer: Self-pay | Admitting: Internal Medicine

## 2022-05-29 ENCOUNTER — Ambulatory Visit (INDEPENDENT_AMBULATORY_CARE_PROVIDER_SITE_OTHER): Payer: BC Managed Care – PPO | Admitting: Internal Medicine

## 2022-05-29 VITALS — BP 114/74 | HR 77 | Temp 97.7°F | Ht 62.0 in | Wt 162.0 lb

## 2022-05-29 DIAGNOSIS — E559 Vitamin D deficiency, unspecified: Secondary | ICD-10-CM

## 2022-05-29 DIAGNOSIS — Z6829 Body mass index (BMI) 29.0-29.9, adult: Secondary | ICD-10-CM

## 2022-05-29 DIAGNOSIS — E669 Obesity, unspecified: Secondary | ICD-10-CM | POA: Diagnosis not present

## 2022-05-29 DIAGNOSIS — R638 Other symptoms and signs concerning food and fluid intake: Secondary | ICD-10-CM

## 2022-05-29 DIAGNOSIS — R7303 Prediabetes: Secondary | ICD-10-CM | POA: Diagnosis not present

## 2022-05-29 MED ORDER — BUPROPION HCL ER (SR) 150 MG PO TB12: 150.0000 mg | ORAL_TABLET | Freq: Two times a day (BID) | ORAL | 0 refills | Status: DC

## 2022-05-29 NOTE — Assessment & Plan Note (Signed)
Most recent A1c was 5.5.Her HOMA- IR is 1.88 and down from 3.46 which shows an improvement in insulin resistance.  I reviewed labs from [facility] dated February 2023.  She will continue with weight loss therapy and reducing simple and added sugars from her diet.  Continue working on increasing physical activity.

## 2022-05-29 NOTE — Progress Notes (Signed)
Office: [phone]  /  Fax: [facility] Height: 5' 2"$  (1.575 m) Weight: 162 lb (73.5 kg) Temp: 97.7 F (36.5 C) Pulse Rate: 77 BP: 114/74 SpO2: 97 % Fasting: No Labs: No Today's Visit #: 31 Weight at Last VIsit: 164 lb Weight Lost Since Last Visit: +1  Body Fat %: 38.8 % Fat Mass (lbs): 63 lbs Muscle Mass (lbs): 94.4 lbs Total Body Water (lbs): 65.8 lbs Visceral Fat Rating : 9 Peak Weight: 197 lb Starting Date: 05/12/20 Starting Weight: 197 lb Total Weight Loss (lbs): 32 lb (14.5 kg)    HPI  Chief Complaint: OBESITY  Carmen Garza is here to discuss her progress with her obesity treatment plan. She is on the the Category 2 Plan and states she is following her eating plan approximately 60 % of the time. She states she is exercising 30 minutes 3-4 times per week.   Interval History:  Since last office visit she has gained 1 lb and reports fair. adherence to prescribed reduced calorie nutrition plan. Has been working on increasing protein and water. PA has decreased Denies problems with appetite and hunger signals.  Denies problems with satiety and satiation.  Denies problems with eating patterns and portion control.  Sleeping approximately 8 hours a day.  Sleep described as restorative.  Stress levels are reported as medium and due to Family.  Barriers identified none.    Pharmacotherapy: Bupropion 150 mg twice daily  PHYSICAL EXAM:  Blood pressure 114/74, pulse 77, temperature 97.7 F (36.5 C), height 5' 2"$  (1.575 m), weight 162 lb (73.5 kg), SpO2 97 %. Body mass index is 29.63 kg/m.  General: She is overweight, cooperative, alert, well developed, and in no acute distress. PSYCH: Has normal mood, affect and thought process.   HEENT: EOMI, sclerae are anicteric. Lungs: Normal breathing effort, no conversational dyspnea. Extremities: No edema.  Neurologic: No gross sensory or motor deficits. No tremors or  fasciculations noted.    ASSESSMENT AND PLAN  TREATMENT PLAN FOR OBESITY:  Recommended Dietary Goals  Carmen Garza is currently in the action stage of change. As such, her goal is to continue weight management plan. She has agreed to the Category 2 Plan.  Behavioral Intervention  We discussed the following Behavioral Modification Strategies today: increasing lean protein intake, decreasing simple carbohydrates , increasing vegetables, increasing lower sugar fruits, increase water intake, reading food labels and making healthy choices when eating convenient foods, and avoiding temptations.  Additional resources provided today: NA  Recommended Physical Activity Goals  Carmen Garza has been advised to work up to 150 minutes of moderate intensity aerobic activity a week and strengthening exercises 2-3 times per week for cardiovascular health, weight loss maintenance and preservation of muscle mass.   She has agreed to continue physical activity as is.    Pharmacotherapy We discussed various medication options to help Carmen Garza with her weight loss efforts and we both agreed to continue with bupropion 150 mg twice daily. ASSOCIATED CONDITIONS ADDRESSED TODAY  Pre-diabetes Assessment & Plan: Most recent A1c was 5.5.Her HOMA- IR is 1.88 and down from 3.46 which shows an improvement in insulin resistance.  I reviewed labs from [facility] dated February 2023.  She will continue with weight loss therapy and reducing simple and added sugars from her diet.  Continue working on increasing physical activity.   Abnormal craving Assessment & Plan: Improved on bupropion 150 mg twice a day.  No side effects reported.  Blood pressure  well-controlled.  Continue current regimen.  Orders: -     buPROPion HCl ER (SR); Take 1 tablet (150 mg total) by mouth 2 (two) times daily.  Dispense: 60 tablet; Refill: 0  Vitamin D deficiency Assessment & Plan: Her vitamin D levels were 68 I recommend she decrease  vitamin D over-the-counter to every other day.  No side effects reported.       DIAGNOSTIC DATA REVIEWED:  BMET    Component Value Date/Time   NA 141 06/09/2021 0740   K 4.8 06/09/2021 0740   CL 102 06/09/2021 0740   CO2 26 06/09/2021 0740   GLUCOSE 96 06/09/2021 0740   GLUCOSE 97 07/05/2011 1034   BUN 18 06/09/2021 0740   CREATININE 0.51 (L) 06/09/2021 0740   CALCIUM 9.5 06/09/2021 0740   GFRNONAA 107 05/13/2020 0852   GFRAA 123 05/13/2020 0852   Lab Results  Component Value Date   HGBA1C 5.5 06/09/2021   HGBA1C 5.8 (H) 05/13/2020   Lab Results  Component Value Date   INSULIN 14.6 06/09/2021   INSULIN 17.7 05/13/2020   Lab Results  Component Value Date   TSH 1.100 06/09/2021   CBC    Component Value Date/Time   WBC 4.8 06/09/2021 0740   WBC 7.9 03/25/2012 1112   WBC 14.2 (H) 06/24/2009 0512   RBC 4.71 06/09/2021 0740   RBC 4.65 03/25/2012 1112   RBC 4.36 06/24/2009 0512   HGB 14.2 06/09/2021 0740   HGB 14.0 03/25/2012 1112   HCT 42.6 06/09/2021 0740   HCT 41.7 03/25/2012 1112   PLT 237 06/09/2021 0740   MCV 90 06/09/2021 0740   MCV 89.7 03/25/2012 1112   MCH 30.1 06/09/2021 0740   MCH 30.2 03/25/2012 1112   MCHC 33.3 06/09/2021 0740   MCHC 33.6 03/25/2012 1112   MCHC 33.4 06/24/2009 0512   RDW 12.0 06/09/2021 0740   RDW 12.8 03/25/2012 1112   Iron Studies    Component Value Date/Time   IRON 72 05/13/2020 0852   TIBC 321 05/13/2020 0852   FERRITIN 203 (H) 05/13/2020 0852   IRONPCTSAT 22 05/13/2020 0852   Lipid Panel     Component Value Date/Time   CHOL 184 06/09/2021 0740   TRIG 114 06/09/2021 0740   HDL 64 06/09/2021 0740   CHOLHDL 2.9 05/13/2020 0852   CHOLHDL 4.4 CALC 07/22/2007 1008   VLDL 17 07/22/2007 1008   LDLCALC 100 (H) 06/09/2021 0740   LDLDIRECT 143.6 07/22/2007 1008   Hepatic Function Panel     Component Value Date/Time   PROT 7.2 06/09/2021 0740   ALBUMIN 4.6 06/09/2021 0740   AST 18 06/09/2021 0740   ALT 24  06/09/2021 0740   ALKPHOS 65 06/09/2021 0740   BILITOT 0.5 06/09/2021 0740   BILIDIR 0.2 07/22/2007 1008      Component Value Date/Time   TSH 1.100 06/09/2021 0740   Nutritional Lab Results  Component Value Date   VD25OH 67.3 06/09/2021   VD25OH 69.9 03/09/2021   VD25OH 77.9 10/13/2020      Return in about 2 weeks (around 06/12/2022) for For Weight Mangement with Dr. Gerarda Fraction.Marland Kitchen She was informed of the importance of frequent follow up visits to maximize her success with intensive lifestyle modifications for her multiple health conditions.    ATTESTASTION STATEMENTS:  Reviewed by clinician on day of visit: allergies, medications, problem list, medical history, surgical history, family history, social history, and previous encounter notes.   Time spent on visit including pre-visit chart review and  post-visit care and charting was 30 minutes.    Thomes Dinning, MD

## 2022-05-29 NOTE — Assessment & Plan Note (Signed)
Her vitamin D levels were 68 I recommend she decrease vitamin D over-the-counter to every other day.  No side effects reported.

## 2022-05-29 NOTE — Assessment & Plan Note (Signed)
Improved on bupropion 150 mg twice a day.  No side effects reported.  Blood pressure well-controlled.  Continue current regimen.

## 2022-05-31 DIAGNOSIS — Z01419 Encounter for gynecological examination (general) (routine) without abnormal findings: Secondary | ICD-10-CM | POA: Diagnosis not present

## 2022-06-02 ENCOUNTER — Telehealth: Payer: Self-pay | Admitting: Genetic Counselor

## 2022-06-02 NOTE — Telephone Encounter (Signed)
Scheduled appt per 2/23 referral. Pt is aware of appt date and time. Pt is aware to arrive 15 mins prior to appt time and to bring and updated insurance card. Pt is aware of appt location.   °

## 2022-06-19 ENCOUNTER — Encounter (INDEPENDENT_AMBULATORY_CARE_PROVIDER_SITE_OTHER): Payer: Self-pay | Admitting: Internal Medicine

## 2022-06-19 ENCOUNTER — Ambulatory Visit (INDEPENDENT_AMBULATORY_CARE_PROVIDER_SITE_OTHER): Payer: BC Managed Care – PPO | Admitting: Internal Medicine

## 2022-06-19 VITALS — BP 107/70 | HR 82 | Temp 98.0°F | Ht 62.0 in | Wt 163.0 lb

## 2022-06-19 DIAGNOSIS — Z6829 Body mass index (BMI) 29.0-29.9, adult: Secondary | ICD-10-CM | POA: Diagnosis not present

## 2022-06-19 DIAGNOSIS — E66812 Morbid (severe) obesity due to excess calories: Secondary | ICD-10-CM

## 2022-06-19 DIAGNOSIS — E669 Obesity, unspecified: Secondary | ICD-10-CM

## 2022-06-19 DIAGNOSIS — E88819 Insulin resistance, unspecified: Secondary | ICD-10-CM

## 2022-06-19 DIAGNOSIS — R638 Other symptoms and signs concerning food and fluid intake: Secondary | ICD-10-CM | POA: Diagnosis not present

## 2022-06-19 MED ORDER — BUPROPION HCL ER (SR) 150 MG PO TB12: 150.0000 mg | ORAL_TABLET | Freq: Two times a day (BID) | ORAL | 0 refills | Status: DC

## 2022-06-19 NOTE — Assessment & Plan Note (Signed)
Improved her Homa IR is 1.8.  She has lost 18% of her body weight.  Continue with weight loss therapy

## 2022-06-19 NOTE — Assessment & Plan Note (Signed)
Improved on bupropion twice a day without any adverse effects.  She will continue medication.

## 2022-06-19 NOTE — Assessment & Plan Note (Signed)
She has reached a plateau.  We will reduce her calories to 1000 cal a day increase protein to 90 g with a target of 30 g per meal.  She will also be working on the frequency of exercise.  She will follow-up in 3 weeks

## 2022-06-19 NOTE — Progress Notes (Signed)
Office: [phone]  /  Fax: [phone]  WEIGHT SUMMARY AND BIOMETRICS  Vitals Temp: 51 F (36.7 C) BP: 107/70 Pulse Rate: 82 SpO2: 96 %   Anthropometric Measurements Height: '5\' 2"'$  (1.575 m) Weight: 163 lb (73.9 kg) BMI (Calculated): 29.81 Weight at Last Visit: 162 lb Weight Lost Since Last Visit: +1 Starting Weight: 197 lb Total Weight Loss (lbs): 31 lb (14.1 kg) Peak Weight: 197 lb   Body Composition  Body Fat %: 39 % Fat Mass (lbs): 63.6 lbs Total Body Water (lbs): 66 lbs    HPI  Chief Complaint: OBESITY  Carmen Garza is here to discuss her progress with her obesity treatment plan. She is on the the Category 2 Plan and states she is following her eating plan approximately 50 % of the time. She states she is exercising 30 minutes 3 times per week.  Interval History:  Since last office visit she has gained 1 lb. She reports fair adherence to reduced calorie nutritional plan. She has been working on doing weights and cardio. Thinking of increasing. '[x]'$ Denies '[]'$ Reports problems with appetite and hunger signals.  '[x]'$ Denies '[]'$ Reports problems with satiety and satiation.  '[x]'$ Denies '[]'$ Reports problems with eating patterns and portion control.  '[x]'$ Denies '[]'$ Reports abnormal cravings Sleeping approximately 7 hours a day.  Sleep described as: '[x]'$ Restorative '[]'$ Unrestorative '[]'$ Interrupted Stress levels are reported as medium and due to Family.  Barriers identified none.   Pharmacotherapy for weight loss: She is currently taking Bupropion (single agent, off label use) .   Weight promoting medications identified: Psychotropic medications.  ASSESSMENT AND PLAN  TREATMENT PLAN FOR OBESITY:  Recommended Dietary Goals  Carmen Garza is currently in the action stage of change. As such, her goal is to continue weight management plan. She has agreed to changing to the Category 1 Plan.  Behavioral Intervention  We discussed the following Behavioral Modification Strategies today:  increasing lean protein intake, increasing vegetables, increasing water intake, work on meal planning and easy cooking plans, work on tracking and journaling calories using tracking App, and reading food labels .  Additional resources provided today: Handout on healthy eating and balanced plate and Handout on complex carbohydrates and lean sources of protein  Recommended Physical Activity Goals  Carmen Garza has been advised to work up to 150 minutes of moderate intensity aerobic activity a week and strengthening exercises 2-3 times per week for cardiovascular health, weight loss maintenance and preservation of muscle mass.   She has agreed to :  '[]'$  Continue current level of physical activity  '[]'$  Think about ways to increase physical activity '[]'$  Start strengthening exercises with a goal of 2-3 sessions a week  '[]'$  Start aerobic activity with a goal of 150 minutes a week at moderate intensity.  '[x]'$  Increase the intensity, frequency or duration of strengthening exercises  '[x]'$  Increase the intensity, frequency or duration of aerobic exercises   '[]'$  Increase physical activity in their day and reduce sedentary time (increase NEAT). '[]'$  Work on scheduling and tracking physical activity.   Pharmacotherapy We discussed various medication options to help Carmen Garza with her weight loss efforts and we both agreed to : continue current anti-obesity medication regimen  ASSOCIATED CONDITIONS ADDRESSED TODAY  Abnormal craving Assessment & Plan: Improved on bupropion twice a day without any adverse effects.  She will continue medication.  Orders: -     buPROPion HCl ER (SR); Take 1 tablet (150 mg total) by mouth 2 (two) times daily.  Dispense: 60 tablet; Refill: 0  Insulin resistance Assessment & Plan:  Improved her Homa IR is 1.8.  She has lost 18% of her body weight.  Continue with weight loss therapy   Obesity BMI with current  BMI 29.5 Assessment & Plan: She has reached a plateau.  We will reduce her  calories to 1000 cal a day increase protein to 90 g with a target of 30 g per meal.  She will also be working on the frequency of exercise.  She will follow-up in 3 weeks      PHYSICAL EXAM:  Blood pressure 107/70, pulse 82, temperature 98 F (36.7 C), height '5\' 2"'$  (1.575 m), weight 163 lb (73.9 kg), SpO2 96 %. Body mass index is 29.81 kg/m.  General: She is overweight, cooperative, alert, well developed, and in no acute distress. PSYCH: Has normal mood, affect and thought process.   HEENT: EOMI, sclerae are anicteric. Lungs: Normal breathing effort, no conversational dyspnea. Extremities: No edema.  Neurologic: No gross sensory or motor deficits. No tremors or fasciculations noted.    DIAGNOSTIC DATA REVIEWED:  BMET    Component Value Date/Time   NA 141 06/09/2021 0740   K 4.8 06/09/2021 0740   CL 102 06/09/2021 0740   CO2 26 06/09/2021 0740   GLUCOSE 96 06/09/2021 0740   GLUCOSE 97 07/05/2011 1034   BUN 18 06/09/2021 0740   CREATININE 0.51 (L) 06/09/2021 0740   CALCIUM 9.5 06/09/2021 0740   GFRNONAA 107 05/13/2020 0852   GFRAA 123 05/13/2020 0852   Lab Results  Component Value Date   HGBA1C 5.5 06/09/2021   HGBA1C 5.8 (H) 05/13/2020   Lab Results  Component Value Date   INSULIN 14.6 06/09/2021   INSULIN 17.7 05/13/2020   Lab Results  Component Value Date   TSH 1.100 06/09/2021   CBC    Component Value Date/Time   WBC 4.8 06/09/2021 0740   WBC 7.9 03/25/2012 1112   WBC 14.2 (H) 06/24/2009 0512   RBC 4.71 06/09/2021 0740   RBC 4.65 03/25/2012 1112   RBC 4.36 06/24/2009 0512   HGB 14.2 06/09/2021 0740   HGB 14.0 03/25/2012 1112   HCT 42.6 06/09/2021 0740   HCT 41.7 03/25/2012 1112   PLT 237 06/09/2021 0740   MCV 90 06/09/2021 0740   MCV 89.7 03/25/2012 1112   MCH 30.1 06/09/2021 0740   MCH 30.2 03/25/2012 1112   MCHC 33.3 06/09/2021 0740   MCHC 33.6 03/25/2012 1112   MCHC 33.4 06/24/2009 0512   RDW 12.0 06/09/2021 0740   RDW 12.8 03/25/2012 1112    Iron Studies    Component Value Date/Time   IRON 72 05/13/2020 0852   TIBC 321 05/13/2020 0852   FERRITIN 203 (H) 05/13/2020 0852   IRONPCTSAT 22 05/13/2020 0852   Lipid Panel     Component Value Date/Time   CHOL 184 06/09/2021 0740   TRIG 114 06/09/2021 0740   HDL 64 06/09/2021 0740   CHOLHDL 2.9 05/13/2020 0852   CHOLHDL 4.4 CALC 07/22/2007 1008   VLDL 17 07/22/2007 1008   LDLCALC 100 (H) 06/09/2021 0740   LDLDIRECT 143.6 07/22/2007 1008   Hepatic Function Panel     Component Value Date/Time   PROT 7.2 06/09/2021 0740   ALBUMIN 4.6 06/09/2021 0740   AST 18 06/09/2021 0740   ALT 24 06/09/2021 0740   ALKPHOS 65 06/09/2021 0740   BILITOT 0.5 06/09/2021 0740   BILIDIR 0.2 07/22/2007 1008      Component Value Date/Time   TSH 1.100 06/09/2021 0740   Nutritional Lab  Results  Component Value Date   VD25OH 67.3 06/09/2021   VD25OH 69.9 03/09/2021   VD25OH 77.9 10/13/2020     Return in about 3 weeks (around 07/10/2022) for For Weight Mangement with Dr. Gerarda Fraction.Marland Kitchen She was informed of the importance of frequent follow up visits to maximize her success with intensive lifestyle modifications for her multiple health conditions.   ATTESTASTION STATEMENTS:  Reviewed by clinician on day of visit: allergies, medications, problem list, medical history, surgical history, family history, social history, and previous encounter notes.     Thomes Dinning, MD

## 2022-07-10 ENCOUNTER — Ambulatory Visit (INDEPENDENT_AMBULATORY_CARE_PROVIDER_SITE_OTHER): Payer: BC Managed Care – PPO | Admitting: Internal Medicine

## 2022-07-10 ENCOUNTER — Encounter (INDEPENDENT_AMBULATORY_CARE_PROVIDER_SITE_OTHER): Payer: Self-pay | Admitting: Internal Medicine

## 2022-07-10 VITALS — BP 110/70 | HR 67 | Temp 98.0°F | Ht 62.0 in | Wt 163.0 lb

## 2022-07-10 DIAGNOSIS — R638 Other symptoms and signs concerning food and fluid intake: Secondary | ICD-10-CM

## 2022-07-10 DIAGNOSIS — E669 Obesity, unspecified: Secondary | ICD-10-CM | POA: Diagnosis not present

## 2022-07-10 DIAGNOSIS — Z6836 Body mass index (BMI) 36.0-36.9, adult: Secondary | ICD-10-CM

## 2022-07-10 MED ORDER — BUPROPION HCL ER (SR) 150 MG PO TB12: 150.0000 mg | ORAL_TABLET | Freq: Two times a day (BID) | ORAL | 0 refills | Status: DC

## 2022-07-10 MED ORDER — TOPIRAMATE 25 MG PO TABS: 25.0000 mg | ORAL_TABLET | Freq: Every day | ORAL | 0 refills | Status: DC

## 2022-07-10 NOTE — Progress Notes (Signed)
Office: [phone]  /  Fax: [phone]  WEIGHT SUMMARY AND BIOMETRICS  Vitals Temp: 98 F (36.7 C) BP: 110/70 Pulse Rate: 67 SpO2: 100 %   Anthropometric Measurements Height: 5\' 2"  (1.575 m) Weight: 163 lb (73.9 kg) BMI (Calculated): 29.81 Weight at Last Visit: 1563 lb Weight Lost Since Last Visit: 0 lb Starting Weight: 197 lb Total Weight Loss (lbs): 31 lb (14.1 kg) Peak Weight: 197 lb   Body Composition  Body Fat %: 39 % Fat Mass (lbs): 63.8 lbs Muscle Mass (lbs): 94.6 lbs Total Body Water (lbs): 67 lbs Visceral Fat Rating : 9    HPI  Chief Complaint: OBESITY  Carmen Garza is here to discuss her progress with her obesity treatment plan. She is on the the Category 1 Plan and states she is following her eating plan approximately 75 % of the time. She states she is exercising 30 minutes 4 times per week.  Interval History:  Since last office visit she has remained weight neutral. She reports good adherence to reduced calorie nutritional plan. She has been working on [employer], [employer] and tracking calories and making healthier choices.  She reports reaching at 1000 cal or staying under on most days.  She does have a very hard time focusing on healthy eating when socializing or surrounded by enticing environments. Reports problems with appetite and hunger signals.  Denies problems with satiety and satiation.  Denies problems with eating patterns and portion control.  Reports abnormal cravings   Barriers identified exposure to enticing environments and or relationships and strong hunger signals and food noise .   Pharmacotherapy for weight loss: She is currently taking Bupropion (single agent, off label use) .    ASSESSMENT AND PLAN  TREATMENT PLAN FOR OBESITY:  Recommended Dietary Goals  Carmen Garza is currently in the action stage of change. As such, her goal is to continue weight management plan. She has agreed to: keep a food journal with 1000 calories and 90g protein   and follow the Category 1 plan  Behavioral Intervention  We discussed the following Behavioral Modification Strategies today: work on tracking and journaling calories using tracking App, avoiding temptations and identifying enticing environmental cues, and planning for success.  Additional resources provided today: None  Recommended Physical Activity Goals  Carmen Garza has been advised to work up to 150 minutes of moderate intensity aerobic activity a week and strengthening exercises 2-3 times per week for cardiovascular health, weight loss maintenance and preservation of muscle mass.   She has agreed to :  Increase the intensity, frequency or duration of aerobic exercises    Pharmacotherapy We discussed various medication options to help Carmen Garza with her weight loss efforts.  She has reached a plateau and I feel she would benefit from pharmacotherapy.  In addition to bupropion which has been prescribed for emotional eating tendencies after discussion of benefits, common side effects and off label use she is agreeable to doing a trial of topiramate 25 mg in the morning.  She will also look into coverage for GLP-1 drugs.  ASSOCIATED CONDITIONS ADDRESSED TODAY  Obesity with current BMI of 30 -     Topiramate; Take 1 tablet (25 mg total) by mouth daily.  Dispense: 30 tablet; Refill: 0  Abnormal craving Assessment & Plan: Neurohormonal and psychological.  Continue bupropion at current dose.  Start topiramate 25 mg in the morning.  Orders: -     buPROPion HCl ER (SR); Take 1 tablet (150 mg total) by mouth 2 (two) times daily.  Dispense: 60 tablet; Refill: 0 -     Topiramate; Take 1 tablet (25 mg total) by mouth daily.  Dispense: 30 tablet; Refill: 0     PHYSICAL EXAM:  Blood pressure 110/70, pulse 67, temperature 98 F (36.7 C), height 5\' 2"  (1.575 m), weight 163 lb (73.9 kg), SpO2 100 %. Body mass index is 29.81 kg/m.  General: She is overweight, cooperative, alert, well developed, and  in no acute distress. PSYCH: Has normal mood, affect and thought process.   HEENT: EOMI, sclerae are anicteric. Lungs: Normal breathing effort, no conversational dyspnea. Extremities: No edema.  Neurologic: No gross sensory or motor deficits. No tremors or fasciculations noted.    DIAGNOSTIC DATA REVIEWED:  BMET    Component Value Date/Time   NA 141 06/09/2021 0740   K 4.8 06/09/2021 0740   CL 102 06/09/2021 0740   CO2 26 06/09/2021 0740   GLUCOSE 96 06/09/2021 0740   GLUCOSE 97 07/05/2011 1034   BUN 18 06/09/2021 0740   CREATININE 0.51 (L) 06/09/2021 0740   CALCIUM 9.5 06/09/2021 0740   GFRNONAA 107 05/13/2020 0852   GFRAA 123 05/13/2020 0852   Lab Results  Component Value Date   HGBA1C 5.5 06/09/2021   HGBA1C 5.8 (H) 05/13/2020   Lab Results  Component Value Date   INSULIN 14.6 06/09/2021   INSULIN 17.7 05/13/2020   Lab Results  Component Value Date   TSH 1.100 06/09/2021   CBC    Component Value Date/Time   WBC 4.8 06/09/2021 0740   WBC 7.9 03/25/2012 1112   WBC 14.2 (H) 06/24/2009 0512   RBC 4.71 06/09/2021 0740   RBC 4.65 03/25/2012 1112   RBC 4.36 06/24/2009 0512   HGB 14.2 06/09/2021 0740   HGB 14.0 03/25/2012 1112   HCT 42.6 06/09/2021 0740   HCT 41.7 03/25/2012 1112   PLT 237 06/09/2021 0740   MCV 90 06/09/2021 0740   MCV 89.7 03/25/2012 1112   MCH 30.1 06/09/2021 0740   MCH 30.2 03/25/2012 1112   MCHC 33.3 06/09/2021 0740   MCHC 33.6 03/25/2012 1112   MCHC 33.4 06/24/2009 0512   RDW 12.0 06/09/2021 0740   RDW 12.8 03/25/2012 1112   Iron Studies    Component Value Date/Time   IRON 72 05/13/2020 0852   TIBC 321 05/13/2020 0852   FERRITIN 203 (H) 05/13/2020 0852   IRONPCTSAT 22 05/13/2020 0852   Lipid Panel     Component Value Date/Time   CHOL 184 06/09/2021 0740   TRIG 114 06/09/2021 0740   HDL 64 06/09/2021 0740   CHOLHDL 2.9 05/13/2020 0852   CHOLHDL 4.4 CALC 07/22/2007 1008   VLDL 17 07/22/2007 1008   LDLCALC 100 (H) 06/09/2021  0740   LDLDIRECT 143.6 07/22/2007 1008   Hepatic Function Panel     Component Value Date/Time   PROT 7.2 06/09/2021 0740   ALBUMIN 4.6 06/09/2021 0740   AST 18 06/09/2021 0740   ALT 24 06/09/2021 0740   ALKPHOS 65 06/09/2021 0740   BILITOT 0.5 06/09/2021 0740   BILIDIR 0.2 07/22/2007 1008      Component Value Date/Time   TSH 1.100 06/09/2021 0740   Nutritional Lab Results  Component Value Date   VD25OH 67.3 06/09/2021   VD25OH 69.9 03/09/2021   VD25OH 77.9 10/13/2020     Return in about 3 weeks (around 07/31/2022) for For Weight Mangement with Dr. Gerarda Fraction.Marland Kitchen She was informed of the importance of frequent follow up visits to maximize her success with intensive  lifestyle modifications for her multiple health conditions.   ATTESTASTION STATEMENTS:  Reviewed by clinician on day of visit: allergies, medications, problem list, medical history, surgical history, family history, social history, and previous encounter notes.     Thomes Dinning, MD

## 2022-07-10 NOTE — Assessment & Plan Note (Signed)
Neurohormonal and psychological.  Continue bupropion at current dose.  Start topiramate 25 mg in the morning.

## 2022-07-24 ENCOUNTER — Other Ambulatory Visit: Payer: BC Managed Care – PPO

## 2022-07-24 ENCOUNTER — Encounter: Payer: BC Managed Care – PPO | Admitting: Genetic Counselor

## 2022-07-31 ENCOUNTER — Encounter (INDEPENDENT_AMBULATORY_CARE_PROVIDER_SITE_OTHER): Payer: Self-pay | Admitting: Internal Medicine

## 2022-07-31 ENCOUNTER — Ambulatory Visit (INDEPENDENT_AMBULATORY_CARE_PROVIDER_SITE_OTHER): Payer: BC Managed Care – PPO | Admitting: Internal Medicine

## 2022-07-31 VITALS — BP 114/79 | HR 79 | Temp 97.8°F | Ht 62.0 in | Wt 162.0 lb

## 2022-07-31 DIAGNOSIS — Z6830 Body mass index (BMI) 30.0-30.9, adult: Secondary | ICD-10-CM | POA: Diagnosis not present

## 2022-07-31 DIAGNOSIS — R638 Other symptoms and signs concerning food and fluid intake: Secondary | ICD-10-CM | POA: Diagnosis not present

## 2022-07-31 DIAGNOSIS — R7303 Prediabetes: Secondary | ICD-10-CM

## 2022-07-31 DIAGNOSIS — E669 Obesity, unspecified: Secondary | ICD-10-CM

## 2022-07-31 MED ORDER — BUPROPION HCL ER (SR) 150 MG PO TB12: 150.0000 mg | ORAL_TABLET | Freq: Two times a day (BID) | ORAL | 0 refills | Status: DC

## 2022-07-31 MED ORDER — TOPIRAMATE 25 MG PO TABS: 25.0000 mg | ORAL_TABLET | Freq: Every day | ORAL | 0 refills | Status: DC

## 2022-07-31 NOTE — Assessment & Plan Note (Signed)
Neurohormonal and psychological.  Continue bupropion at current dose.  Start topiramate 25 mg in the morning.  Increase to 50 mg in the morning after 7 days.  She will look into coverage for [facility].  We may also consider metformin as she has insulin resistance.

## 2022-07-31 NOTE — Progress Notes (Signed)
Office: [phone]  /  Fax: [phone]  WEIGHT SUMMARY AND BIOMETRICS  Vitals Temp: 97.8 F (36.6 C) BP: 114/79 Pulse Rate: 79 SpO2: 99 %   Anthropometric Measurements Height:  (1.575 m) Weight: 162 lb (73.5 kg) BMI (Calculated): 29.62 Weight at Last Visit: 163 lb Weight Lost Since Last Visit: 1 lb Starting Weight: 197 lb Total Weight Loss (lbs): 32 lb (14.5 kg) Peak Weight: 197 lb   Body Composition  Body Fat %: 39.1 % Fat Mass (lbs): 63.4 lbs Muscle Mass (lbs): 93.8 lbs Total Body Water (lbs): 66 lbs Visceral Fat Rating : 9    No data recorded Today's Visit #: 34  No data recorded  HPI  Chief Complaint: OBESITY  Carmen Garza is here to discuss her progress with her obesity treatment plan. She is on the the Category 1 Plan and states she is following her eating plan approximately 33 % of the time. She states she is exercising 30 minutes 2-3 times per week.  Interval History:  Since last office visit she has lost 1 lb. Last office visit we started topiramate 25 mg in the morning but she is been taking it around noontime.  She has not noticed an improvement in her appetite.  She also notes recent celebrations and has not been able to exercise over the last 2 weeks.  Her adherence to reduced calorie nutrition plan has been lower. She has been working on not skipping meals, increasing protein intake at every meal, working on gradual implementation of reduced calorie nutrition plan, thinking of starting to exercise, and getting back on track following recent lapse Reports problems with appetite and hunger signals.  Denies problems with satiety and satiation.  Denies problems with eating patterns and portion control.  Reports abnormal cravings. Denies feeling deprived or restricted.   Barriers identified: lack of time for self-care.   Pharmacotherapy for weight loss: She is currently taking Bupropion (single agent, off label use)  and Topiramate (off label use,  single agent).    ASSESSMENT AND PLAN  TREATMENT PLAN FOR OBESITY:  Recommended Dietary Goals  [name] is currently in the action stage of change. As such, her goal is to continue weight management plan. She has agreed to: continue current plan  Behavioral Intervention  We discussed the following Behavioral Modification Strategies today: increasing lean protein intake, decreasing simple carbohydrates , increasing vegetables, increasing lower glycemic fruits, increasing water intake, continue to practice mindfulness when eating, and planning for success.  Additional resources provided today: None  Recommended Physical Activity Goals  [name] has been advised to work up to 150 minutes of moderate intensity aerobic activity a week and strengthening exercises 2-3 times per week for cardiovascular health, weight loss maintenance and preservation of muscle mass.   She has agreed to :  Increase the intensity, frequency or duration of strengthening exercises , Increase the intensity, frequency or duration of aerobic exercises  , Increase physical activity in their day and reduce sedentary time (increase NEAT)., and Work on scheduling and tracking physical activity.   Pharmacotherapy We discussed various medication options to help [name] with her weight loss efforts and we both agreed to : continue current anti-obesity medication regimen.  She will look into coverage for incretin therapy.  She may increase topiramate to 50 mg after the first week if she does not notice a clinical effect.  If she does not respond to this dose medication will be discontinued.  We may also try metformin.  ASSOCIATED CONDITIONS ADDRESSED  TODAY  Pre-diabetes Assessment & Plan: Most recent A1c was 5.5.Her HOMA- IR is 1.88 and down from 3.46 which shows an improvement in insulin resistance.  I reviewed labs from [facility] dated February 2023.  She will continue with weight loss therapy and reducing simple  and added sugars from her diet.  Continue working on increasing physical activity.  We may consider metformin if topiramate is ineffective and Wegovy is not covered.   Abnormal craving Assessment & Plan: Neurohormonal and psychological.  Continue bupropion at current dose.  Start topiramate 25 mg in the morning.  Increase to 50 mg in the morning after 7 days.  She will look into coverage for [facility].  We may also consider metformin as she has insulin resistance.  Orders: -     buPROPion HCl ER (SR); Take 1 tablet (150 mg total) by mouth 2 (two) times daily.  Dispense: 60 tablet; Refill: 0 -     Topiramate; Take 1 tablet (25 mg total) by mouth daily.  Dispense: 30 tablet; Refill: 0  Obesity with current BMI of 30 Assessment & Plan: [name] has lost about 46 pounds or 23% of baseline body weight.  She has plateaued likely due to metabolic adaptations.  We discussed strategies to overcome this.  We will increase the topiramate to 50 mg a day.  We will consider incretin therapy if is covered by her insurance.  Orders: -     Topiramate; Take 1 tablet (25 mg total) by mouth daily.  Dispense: 30 tablet; Refill: 0     PHYSICAL EXAM:  Blood pressure 114/79, pulse 79, temperature 97.8 F (36.6 C), height 5\' 2"  (1.575 m), weight 162 lb (73.5 kg), SpO2 99 %. Body mass index is 29.63 kg/m.  General: She is overweight, cooperative, alert, well developed, and in no acute distress. PSYCH: Has normal mood, affect and thought process.   HEENT: EOMI, sclerae are anicteric. Lungs: Normal breathing effort, no conversational dyspnea. Extremities: No edema.  Neurologic: No gross sensory or motor deficits. No tremors or fasciculations noted.    DIAGNOSTIC DATA REVIEWED:  BMET    Component Value Date/Time   NA 141 06/09/2021 0740   K 4.8 06/09/2021 0740   CL 102 06/09/2021 0740   CO2 26 06/09/2021 0740   GLUCOSE 96 06/09/2021 0740   GLUCOSE 97 07/05/2011 1034   BUN 18 06/09/2021 0740   CREATININE  0.51 (L) 06/09/2021 0740   CALCIUM 9.5 06/09/2021 0740   GFRNONAA 107 05/13/2020 0852   GFRAA 123 05/13/2020 0852   Lab Results  Component Value Date   HGBA1C 5.5 06/09/2021   HGBA1C 5.8 (H) 05/13/2020   Lab Results  Component Value Date   INSULIN 14.6 06/09/2021   INSULIN 17.7 05/13/2020   Lab Results  Component Value Date   TSH 1.100 06/09/2021   CBC    Component Value Date/Time   WBC 4.8 06/09/2021 0740   WBC 7.9 03/25/2012 1112   WBC 14.2 (H) 06/24/2009 0512   RBC 4.71 06/09/2021 0740   RBC 4.65 03/25/2012 1112   RBC 4.36 06/24/2009 0512   HGB 14.2 06/09/2021 0740   HGB 14.0 03/25/2012 1112   HCT 42.6 06/09/2021 0740   HCT 41.7 03/25/2012 1112   PLT 237 06/09/2021 0740   MCV 90 06/09/2021 0740   MCV 89.7 03/25/2012 1112   MCH 30.1 06/09/2021 0740   MCH 30.2 03/25/2012 1112   MCHC 33.3 06/09/2021 0740   MCHC 33.6 03/25/2012 1112   MCHC 33.4  06/24/2009 0512   RDW 12.0 06/09/2021 0740   RDW 12.8 03/25/2012 1112   Iron Studies    Component Value Date/Time   IRON 72 05/13/2020 0852   TIBC 321 05/13/2020 0852   FERRITIN 203 (H) 05/13/2020 0852   IRONPCTSAT 22 05/13/2020 0852   Lipid Panel     Component Value Date/Time   CHOL 184 06/09/2021 0740   TRIG 114 06/09/2021 0740   HDL 64 06/09/2021 0740   CHOLHDL 2.9 05/13/2020 0852   CHOLHDL 4.4 CALC 07/22/2007 1008   VLDL 17 07/22/2007 1008   LDLCALC 100 (H) 06/09/2021 0740   LDLDIRECT 143.6 07/22/2007 1008   Hepatic Function Panel     Component Value Date/Time   PROT 7.2 06/09/2021 0740   ALBUMIN 4.6 06/09/2021 0740   AST 18 06/09/2021 0740   ALT 24 06/09/2021 0740   ALKPHOS 65 06/09/2021 0740   BILITOT 0.5 06/09/2021 0740   BILIDIR 0.2 07/22/2007 1008      Component Value Date/Time   TSH 1.100 06/09/2021 0740   Nutritional Lab Results  Component Value Date   VD25OH 67.3 06/09/2021   VD25OH 69.9 03/09/2021   VD25OH 77.9 10/13/2020     Return in about 3 weeks (around 08/21/2022) for For  Weight Mangement with Dr. Rikki Spearing.Marland Kitchen She was informed of the importance of frequent follow up visits to maximize her success with intensive lifestyle modifications for her multiple health conditions.   ATTESTASTION STATEMENTS:  Reviewed by clinician on day of visit: allergies, medications, problem list, medical history, surgical history, family history, social history, and previous encounter notes.     Worthy Rancher, MD

## 2022-07-31 NOTE — Assessment & Plan Note (Signed)
Carmen Garza has lost about 46 pounds or 23% of baseline body weight.  She has plateaued likely due to metabolic adaptations.  We discussed strategies to overcome this.  We will increase the topiramate to 50 mg a day.  We will consider incretin therapy if is covered by her insurance.

## 2022-07-31 NOTE — Assessment & Plan Note (Signed)
Most recent A1c was 5.5.Her HOMA- IR is 1.88 and down from 3.46 which shows an improvement in insulin resistance.  I reviewed labs from [facility] group dated February 2023.  She will continue with weight loss therapy and reducing simple and added sugars from her diet.  Continue working on increasing physical activity.  We may consider metformin if topiramate is ineffective and Wegovy is not covered.

## 2022-08-02 ENCOUNTER — Encounter (INDEPENDENT_AMBULATORY_CARE_PROVIDER_SITE_OTHER): Payer: Self-pay | Admitting: Internal Medicine

## 2022-08-03 ENCOUNTER — Encounter (INDEPENDENT_AMBULATORY_CARE_PROVIDER_SITE_OTHER): Payer: Self-pay | Admitting: Adult Health

## 2022-08-03 ENCOUNTER — Other Ambulatory Visit (INDEPENDENT_AMBULATORY_CARE_PROVIDER_SITE_OTHER): Payer: Self-pay | Admitting: Adult Health

## 2022-08-03 DIAGNOSIS — K649 Unspecified hemorrhoids: Secondary | ICD-10-CM | POA: Diagnosis not present

## 2022-08-03 DIAGNOSIS — R638 Other symptoms and signs concerning food and fluid intake: Secondary | ICD-10-CM

## 2022-08-03 DIAGNOSIS — Z800 Family history of malignant neoplasm of digestive organs: Secondary | ICD-10-CM | POA: Diagnosis not present

## 2022-08-03 DIAGNOSIS — Z1211 Encounter for screening for malignant neoplasm of colon: Secondary | ICD-10-CM | POA: Diagnosis not present

## 2022-08-03 DIAGNOSIS — K5730 Diverticulosis of large intestine without perforation or abscess without bleeding: Secondary | ICD-10-CM | POA: Diagnosis not present

## 2022-08-03 DIAGNOSIS — Z6836 Body mass index (BMI) 36.0-36.9, adult: Secondary | ICD-10-CM

## 2022-08-03 MED ORDER — TOPIRAMATE 25 MG PO TABS: ORAL_TABLET | ORAL | 0 refills | Status: DC

## 2022-08-23 ENCOUNTER — Ambulatory Visit (INDEPENDENT_AMBULATORY_CARE_PROVIDER_SITE_OTHER): Payer: BC Managed Care – PPO | Admitting: Internal Medicine

## 2022-08-23 DIAGNOSIS — D1721 Benign lipomatous neoplasm of skin and subcutaneous tissue of right arm: Secondary | ICD-10-CM | POA: Diagnosis not present

## 2022-08-23 DIAGNOSIS — M67449 Ganglion, unspecified hand: Secondary | ICD-10-CM | POA: Diagnosis not present

## 2022-08-23 DIAGNOSIS — D229 Melanocytic nevi, unspecified: Secondary | ICD-10-CM | POA: Diagnosis not present

## 2022-08-23 DIAGNOSIS — L578 Other skin changes due to chronic exposure to nonionizing radiation: Secondary | ICD-10-CM | POA: Diagnosis not present

## 2022-08-30 ENCOUNTER — Ambulatory Visit (INDEPENDENT_AMBULATORY_CARE_PROVIDER_SITE_OTHER): Payer: BC Managed Care – PPO | Admitting: Internal Medicine

## 2022-08-30 ENCOUNTER — Encounter (INDEPENDENT_AMBULATORY_CARE_PROVIDER_SITE_OTHER): Payer: Self-pay | Admitting: Internal Medicine

## 2022-08-30 VITALS — BP 96/66 | HR 73 | Temp 98.7°F | Ht 62.0 in | Wt 162.0 lb

## 2022-08-30 DIAGNOSIS — R7303 Prediabetes: Secondary | ICD-10-CM

## 2022-08-30 DIAGNOSIS — E669 Obesity, unspecified: Secondary | ICD-10-CM

## 2022-08-30 DIAGNOSIS — R638 Other symptoms and signs concerning food and fluid intake: Secondary | ICD-10-CM | POA: Diagnosis not present

## 2022-08-30 DIAGNOSIS — Z6829 Body mass index (BMI) 29.0-29.9, adult: Secondary | ICD-10-CM

## 2022-08-30 MED ORDER — BUPROPION HCL ER (SR) 150 MG PO TB12: 150.0000 mg | ORAL_TABLET | Freq: Two times a day (BID) | ORAL | 0 refills | Status: DC

## 2022-08-30 MED ORDER — TOPIRAMATE 25 MG PO TABS: ORAL_TABLET | ORAL | 0 refills | Status: DC

## 2022-08-30 NOTE — Progress Notes (Signed)
Office: [phone]  /  Fax: [phone]  WEIGHT SUMMARY AND BIOMETRICS  Vitals Temp: 98.7 F (37.1 C) BP: 96/66 Pulse Rate: 73 SpO2: 99 %   Anthropometric Measurements Height: 5\' 2"  (1.575 m) Weight: 162 lb (73.5 kg) BMI (Calculated): 29.62 Weight at Last Visit: 162 lb Weight Lost Since Last Visit: 0 Weight Gained Since Last Visit: 0 Starting Weight: 197 lb Total Weight Loss (lbs): 32 lb (14.5 kg) Peak Weight: 197 lb   Body Composition  Body Fat %: 39.1 % Fat Mass (lbs): 63.4 lbs Muscle Mass (lbs): 94 lbs Total Body Water (lbs): 67.8 lbs Visceral Fat Rating : 9    No data recorded Today's Visit #: 35  Starting Date: 05/30/20   HPI  Chief Complaint: OBESITY  Carmen Garza is here to discuss her progress with her obesity treatment plan. She is on the the Category 1 Plan and states she is following her eating plan approximately 50 % of the time. She states she is exercising weights and cardio 30-40 minutes 3-4 times per week.  Interval History:  Since last office visit she has []  lost [x]  maintained [] gained weight.  Adherence to nutrition plan :  []  Excellent []  Good [x]  Fair []  Suboptimal []  Variable []  Gradual implementation [] Has not started implementation  Nutritional: She has been working on not skipping meals, increasing protein intake at every meal, and continues to exercise  Orexigenic Control: [x] Denies [] Reports problems with appetite and hunger signals.  [x] Denies [] Reports problems with satiety and satiation.  [x] Denies [] Reports problems with eating patterns and portion control.  [] Denies [x] Reports strong cravings for highly palatable foods [x] Denies [] Reports problems with feeling restricted or deprived  Stress levels: []  Low [] Medium [x] High   Barriers identified: exposure to enticing environments and or relationships and complex family dynamics.   Pharmacotherapy for weight loss: She is currently taking Bupropion (single agent, off  label use)  and Topiramate (off label use, single agent).    ASSESSMENT AND PLAN  TREATMENT PLAN FOR OBESITY:  Recommended Dietary Goals  Carmen Garza is currently in the action stage of change. As such, her goal is to continue weight management plan. She has agreed to: continue current plan  Behavioral Intervention  We discussed the following Behavioral Modification Strategies today: increasing lean protein intake, decreasing simple carbohydrates , increasing vegetables, increasing lower glycemic fruits, increasing fiber rich foods, increasing water intake, work on tracking and journaling calories using tracking application, reading food labels , avoiding temptations and identifying enticing environmental cues, continue to practice mindfulness when eating, and planning for success.  Additional resources provided today: Information on Full Plate Living (plant-based nutrition) - fullplateliving.org  Recommended Physical Activity Goals  Carmen Garza has been advised to work up to 150 minutes of moderate intensity aerobic activity a week and strengthening exercises 2-3 times per week for cardiovascular health, weight loss maintenance and preservation of muscle mass.   She has agreed to :  Start strengthening exercises with a goal of 2-3 sessions a week   Pharmacotherapy We have discussed various medication options to help Carmen Garza with her weight loss efforts and we both agreed to : continue current anti-obesity medication regimen and change topiramate to 25 mg twice a day.  ASSOCIATED CONDITIONS ADDRESSED TODAY  Pre-diabetes Assessment & Plan: Most recent A1c was 5.5.Her HOMA- IR is 1.88 and down from 3.46 which shows an improvement in insulin resistance.  I reviewed labs from [facility] physicians group dated February 2024.  She will continue with weight loss therapy and reducing  simple and added sugars from her diet.  Continue working on increasing physical activity.  We may consider metformin if  topiramate is ineffective and Wegovy is not covered.   Abnormal craving Assessment & Plan: Neurohormonal and psychological.  Continue bupropion at current dose.  Change topiramate to 25 mg twice a day for better evening coverage.  Reviewed CMP from [facility] physicians normal bicarb and potassium and renal function.  Orders: -     buPROPion HCl ER (SR); Take 1 tablet (150 mg total) by mouth 2 (two) times daily.  Dispense: 60 tablet; Refill: 0 -     Topiramate; 2 tabs daily in morning  Dispense: 60 tablet; Refill: 0  Obesity with current BMI of 30 Assessment & Plan: Overall she has lost 18% of baseline body weight and is reaching a plateau she will start journaling and tracking calories 3 to 4 days out of the week and would incorporate strengthening exercises into her routine.  Orders: -     Topiramate; 2 tabs daily in morning  Dispense: 60 tablet; Refill: 0    PHYSICAL EXAM:  Blood pressure 96/66, pulse 73, temperature 98.7 F (37.1 C), height 5\' 2"  (1.575 m), weight 162 lb (73.5 kg), SpO2 99 %. Body mass index is 29.63 kg/m.  General: She is overweight, cooperative, alert, well developed, and in no acute distress. PSYCH: Has normal mood, affect and thought process.   HEENT: EOMI, sclerae are anicteric. Lungs: Normal breathing effort, no conversational dyspnea. Extremities: No edema.  Neurologic: No gross sensory or motor deficits. No tremors or fasciculations noted.    DIAGNOSTIC DATA REVIEWED:  BMET    Component Value Date/Time   NA 141 06/09/2021 0740   K 4.8 06/09/2021 0740   CL 102 06/09/2021 0740   CO2 26 06/09/2021 0740   GLUCOSE 96 06/09/2021 0740   GLUCOSE 97 07/05/2011 1034   BUN 18 06/09/2021 0740   CREATININE 0.51 (L) 06/09/2021 0740   CALCIUM 9.5 06/09/2021 0740   GFRNONAA 107 05/13/2020 0852   GFRAA 123 05/13/2020 0852   EGFR 111 06/09/2021 0740   Lab Results  Component Value Date   HGBA1C 5.5 06/09/2021   HGBA1C 5.8 (H) 05/13/2020   Lab Results   Component Value Date   INSULIN 14.6 06/09/2021   INSULIN 17.7 05/13/2020   Lab Results  Component Value Date   TSH 1.100 06/09/2021   CBC    Component Value Date/Time   WBC 4.8 06/09/2021 0740   WBC 7.9 03/25/2012 1112   WBC 14.2 (H) 06/24/2009 0512   RBC 4.71 06/09/2021 0740   RBC 4.65 03/25/2012 1112   RBC 4.36 06/24/2009 0512   HGB 14.2 06/09/2021 0740   HGB 14.0 03/25/2012 1112   HCT 42.6 06/09/2021 0740   HCT 41.7 03/25/2012 1112   PLT 237 06/09/2021 0740   MCV 90 06/09/2021 0740   MCV 89.7 03/25/2012 1112   MCH 30.1 06/09/2021 0740   MCH 30.2 03/25/2012 1112   MCHC 33.3 06/09/2021 0740   MCHC 33.6 03/25/2012 1112   MCHC 33.4 06/24/2009 0512   RDW 12.0 06/09/2021 0740   RDW 12.8 03/25/2012 1112   Iron Studies    Component Value Date/Time   IRON 72 05/13/2020 0852   TIBC 321 05/13/2020 0852   FERRITIN 203 (H) 05/13/2020 0852   IRONPCTSAT 22 05/13/2020 0852   Lipid Panel     Component Value Date/Time   CHOL 184 06/09/2021 0740   TRIG 114 06/09/2021 0740   HDL 64  06/09/2021 0740   CHOLHDL 2.9 05/13/2020 0852   CHOLHDL 4.4 CALC 07/22/2007 1008   VLDL 17 07/22/2007 1008   LDLCALC 100 (H) 06/09/2021 0740   LDLDIRECT 143.6 07/22/2007 1008   Hepatic Function Panel     Component Value Date/Time   PROT 7.2 06/09/2021 0740   ALBUMIN 4.6 06/09/2021 0740   AST 18 06/09/2021 0740   ALT 24 06/09/2021 0740   ALKPHOS 65 06/09/2021 0740   BILITOT 0.5 06/09/2021 0740   BILIDIR 0.2 07/22/2007 1008      Component Value Date/Time   TSH 1.100 06/09/2021 0740   Nutritional Lab Results  Component Value Date   VD25OH 67.3 06/09/2021   VD25OH 69.9 03/09/2021   VD25OH 77.9 10/13/2020     Return in about 4 weeks (around 09/27/2022) for For Weight Mangement with Dr. Rikki Spearing.Marland Kitchen She was informed of the importance of frequent follow up visits to maximize her success with intensive lifestyle modifications for her multiple health conditions.   ATTESTASTION  STATEMENTS:  Reviewed by clinician on day of visit: allergies, medications, problem list, medical history, surgical history, family history, social history, and previous encounter notes.     Worthy Rancher, MD

## 2022-08-30 NOTE — Assessment & Plan Note (Signed)
Overall she has lost 18% of baseline body weight and is reaching a plateau she will start journaling and tracking calories 3 to 4 days out of the week and would incorporate strengthening exercises into her routine.

## 2022-08-30 NOTE — Assessment & Plan Note (Signed)
Neurohormonal and psychological.  Continue bupropion at current dose.  Change topiramate to 25 mg twice a day for better evening coverage.  Reviewed CMP from [facility] physicians normal bicarb and potassium and renal function.

## 2022-08-30 NOTE — Assessment & Plan Note (Signed)
Most recent A1c was 5.5.Her HOMA- IR is 1.88 and down from 3.46 which shows an improvement in insulin resistance.  I reviewed labs from [facility] physicians group dated February 2024.  She will continue with weight loss therapy and reducing simple and added sugars from her diet.  Continue working on increasing physical activity.  We may consider metformin if topiramate is ineffective and Wegovy is not covered.

## 2022-10-05 ENCOUNTER — Encounter (INDEPENDENT_AMBULATORY_CARE_PROVIDER_SITE_OTHER): Payer: Self-pay | Admitting: Internal Medicine

## 2022-10-05 ENCOUNTER — Ambulatory Visit (INDEPENDENT_AMBULATORY_CARE_PROVIDER_SITE_OTHER): Payer: BC Managed Care – PPO | Admitting: Internal Medicine

## 2022-10-05 ENCOUNTER — Other Ambulatory Visit: Payer: Self-pay | Admitting: Family Medicine

## 2022-10-05 VITALS — BP 114/77 | HR 77 | Temp 98.1°F | Ht 62.0 in | Wt 163.0 lb

## 2022-10-05 DIAGNOSIS — R638 Other symptoms and signs concerning food and fluid intake: Secondary | ICD-10-CM | POA: Diagnosis not present

## 2022-10-05 DIAGNOSIS — E88819 Insulin resistance, unspecified: Secondary | ICD-10-CM | POA: Diagnosis not present

## 2022-10-05 DIAGNOSIS — Z1231 Encounter for screening mammogram for malignant neoplasm of breast: Secondary | ICD-10-CM

## 2022-10-05 DIAGNOSIS — Z6829 Body mass index (BMI) 29.0-29.9, adult: Secondary | ICD-10-CM | POA: Diagnosis not present

## 2022-10-05 DIAGNOSIS — E669 Obesity, unspecified: Secondary | ICD-10-CM

## 2022-10-05 NOTE — Assessment & Plan Note (Signed)
Overall she has lost 18% of baseline body weight and is reaching a plateau she will start journaling and tracking calories 3 to 4 days out of the week and would incorporate strengthening exercises into her routine.  We decided today to change her antiobesity medications.  Please refer to abnormal cravings

## 2022-10-05 NOTE — Progress Notes (Signed)
Office: [phone]  /  Fax: [phone]  WEIGHT SUMMARY AND BIOMETRICS  Vitals Temp: 98.1 F (36.7 C) BP: 114/77 Pulse Rate: 77 SpO2: 97 %   Anthropometric Measurements Height: 5\' 2"  (1.575 m) Weight: 163 lb (73.9 kg) BMI (Calculated): 29.81 Weight at Last Visit: 162lb Weight Lost Since Last Visit: 0 Weight Gained Since Last Visit: 1lb Starting Weight: 197lb Total Weight Loss (lbs): 31 lb (14.1 kg) Peak Weight: 197lb   Body Composition  Body Fat %: 38.7 % Fat Mass (lbs): 63 lbs Muscle Mass (lbs): 95 lbs Total Body Water (lbs): 65.4 lbs Visceral Fat Rating : 9    No data recorded Today's Visit #: 36  Starting Date: 05/30/20   HPI  Chief Complaint: OBESITY  Carmen Garza is here to discuss her progress with her obesity treatment plan. She is on the the Category 1 Plan and states she is following her eating plan approximately 50 % of the time. She states she is exercising by walking 30 minutes 3 times per week.  Interval History:  Since last office visit she has gained 1 pound.  Last office visit we had changed topiramate to twice a day but she did not notice any changes in appetite or satiety. She reports fair adherence to reduced calorie nutritional plan. She has been working on not skipping meals, increasing protein intake at every meal, drinking more water, and making healthier choices  Orixegenic Control: Reports problems with appetite and hunger signals.  Denies problems with satiety and satiation.  Denies problems with eating patterns and portion control.  Reports abnormal cravings. Denies feeling deprived or restricted.   Barriers identified: strong hunger signals and appetite.   Pharmacotherapy for weight loss: She is currently taking Bupropion (single agent, off label use) without clinical response and without side effects. and Topiramate (off label use, single agent) without clinical response and without side effects..    ASSESSMENT AND  PLAN  TREATMENT PLAN FOR OBESITY:  Recommended Dietary Goals  Carmen Garza is currently in the action stage of change. As such, her goal is to continue weight management plan. She has agreed to: Continue current plan  Behavioral Intervention  She will work on not skipping meals, getting at least 30 to 40 g of protein per meal.  Additional resources provided today: Handout on FDA approved anti-obesity medications and adverse effects  Recommended Physical Activity Goals  Carmen Garza has been advised to work up to 150 minutes of moderate intensity aerobic activity a week and strengthening exercises 2-3 times per week for cardiovascular health, weight loss maintenance and preservation of muscle mass.   She has agreed to :  Think about ways to increase daily physical activity and overcoming barriers to exercise and Increase physical activity in their day and reduce sedentary time (increase NEAT).  Pharmacotherapy We discussed various medication options to help Carmen Garza with her weight loss efforts and we both agreed to :  She has been on bupropion for emotional eating, medication precedes me.  She no longer feels the medication is of clinical benefit.  We also had tried topiramate monotherapy without clinical response.  48  ASSOCIATED CONDITIONS ADDRESSED TODAY  Insulin resistance Assessment & Plan: Improved her Homa IR is 1.8.  She has lost 18% of her body weight.  Continue with weight loss therapy and avoidance of simple and added sugars in diet.   Abnormal craving Assessment & Plan: She denies symptoms of depression, in addition she feels that bupropion is no longer of benefit.  I think  we need to change medical therapy.  Incretin therapy is cost prohibitive.  After discussion of benefits and side effects she is agreeable to trying Qsymia in order to do this I will like to taper her off bupropion and topiramate.  She will taper medications following this schedule: She will reduce bupropion to just  once a day for 1 week then every other day and stop.  She will reduce topiramate to 1 tablet daily for 1 week then 1 tablet every other day for another week and then stop.  I will be seeing her at the tail end to see how she is doing and we can decide at that point whether or not to proceed with Qsymia.  I provided her with information regarding medication.   Obesity with current BMI of 30 Assessment & Plan: Overall she has lost 18% of baseline body weight and is reaching a plateau she will start journaling and tracking calories 3 to 4 days out of the week and would incorporate strengthening exercises into her routine.  We decided today to change her antiobesity medications.  Please refer to abnormal cravings     PHYSICAL EXAM:  Blood pressure 114/77, pulse 77, temperature 98.1 F (36.7 C), height 5\' 2"  (1.575 m), weight 163 lb (73.9 kg), SpO2 97 %. Body mass index is 29.81 kg/m.  General: She is overweight, cooperative, alert, well developed, and in no acute distress. PSYCH: Has normal mood, affect and thought process.   HEENT: EOMI, sclerae are anicteric. Lungs: Normal breathing effort, no conversational dyspnea. Extremities: No edema.  Neurologic: No gross sensory or motor deficits. No tremors or fasciculations noted.    DIAGNOSTIC DATA REVIEWED:  BMET    Component Value Date/Time   NA 141 06/09/2021 0740   K 4.8 06/09/2021 0740   CL 102 06/09/2021 0740   CO2 26 06/09/2021 0740   GLUCOSE 96 06/09/2021 0740   GLUCOSE 97 07/05/2011 1034   BUN 18 06/09/2021 0740   CREATININE 0.51 (L) 06/09/2021 0740   CALCIUM 9.5 06/09/2021 0740   GFRNONAA 107 05/13/2020 0852   GFRAA 123 05/13/2020 0852   Lab Results  Component Value Date   HGBA1C 5.5 06/09/2021   HGBA1C 5.8 (H) 05/13/2020   Lab Results  Component Value Date   INSULIN 14.6 06/09/2021   INSULIN 17.7 05/13/2020   Lab Results  Component Value Date   TSH 1.100 06/09/2021   CBC    Component Value Date/Time   WBC  4.8 06/09/2021 0740   WBC 7.9 03/25/2012 1112   WBC 14.2 (H) 06/24/2009 0512   RBC 4.71 06/09/2021 0740   RBC 4.65 03/25/2012 1112   RBC 4.36 06/24/2009 0512   HGB 14.2 06/09/2021 0740   HGB 14.0 03/25/2012 1112   HCT 42.6 06/09/2021 0740   HCT 41.7 03/25/2012 1112   PLT 237 06/09/2021 0740   MCV 90 06/09/2021 0740   MCV 89.7 03/25/2012 1112   MCH 30.1 06/09/2021 0740   MCH 30.2 03/25/2012 1112   MCHC 33.3 06/09/2021 0740   MCHC 33.6 03/25/2012 1112   MCHC 33.4 06/24/2009 0512   RDW 12.0 06/09/2021 0740   RDW 12.8 03/25/2012 1112   Iron Studies    Component Value Date/Time   IRON 72 05/13/2020 0852   TIBC 321 05/13/2020 0852   FERRITIN 203 (H) 05/13/2020 0852   IRONPCTSAT 22 05/13/2020 0852   Lipid Panel     Component Value Date/Time   CHOL 184 06/09/2021 0740   TRIG 114 06/09/2021  0740   HDL 64 06/09/2021 0740   CHOLHDL 2.9 05/13/2020 0852   CHOLHDL 4.4 CALC 07/22/2007 1008   VLDL 17 07/22/2007 1008   LDLCALC 100 (H) 06/09/2021 0740   LDLDIRECT 143.6 07/22/2007 1008   Hepatic Function Panel     Component Value Date/Time   PROT 7.2 06/09/2021 0740   ALBUMIN 4.6 06/09/2021 0740   AST 18 06/09/2021 0740   ALT 24 06/09/2021 0740   ALKPHOS 65 06/09/2021 0740   BILITOT 0.5 06/09/2021 0740   BILIDIR 0.2 07/22/2007 1008      Component Value Date/Time   TSH 1.100 06/09/2021 0740   Nutritional Lab Results  Component Value Date   VD25OH 67.3 06/09/2021   VD25OH 69.9 03/09/2021   VD25OH 77.9 10/13/2020     Return in about 2 weeks (around 10/19/2022) for For Weight Mangement with Dr. Rikki Spearing for medication adjustment.. She was informed of the importance of frequent follow up visits to maximize her success with intensive lifestyle modifications for her multiple health conditions.   ATTESTASTION STATEMENTS:  Reviewed by clinician on day of visit: allergies, medications, problem list, medical history, surgical history, family history, social history, and  previous encounter notes.     Worthy Rancher, MD

## 2022-10-05 NOTE — Assessment & Plan Note (Signed)
Improved her Homa IR is 1.8.  She has lost 18% of her body weight.  Continue with weight loss therapy and avoidance of simple and added sugars in diet.

## 2022-10-05 NOTE — Assessment & Plan Note (Signed)
She denies symptoms of depression, in addition she feels that bupropion is no longer of benefit.  I think we need to change medical therapy.  Incretin therapy is cost prohibitive.  After discussion of benefits and side effects she is agreeable to trying Qsymia in order to do this I will like to taper her off bupropion and topiramate.  She will taper medications following this schedule: She will reduce bupropion to just once a day for 1 week then every other day and stop.  She will reduce topiramate to 1 tablet daily for 1 week then 1 tablet every other day for another week and then stop.  I will be seeing her at the tail end to see how she is doing and we can decide at that point whether or not to proceed with Qsymia.  I provided her with information regarding medication.

## 2022-10-06 ENCOUNTER — Other Ambulatory Visit (INDEPENDENT_AMBULATORY_CARE_PROVIDER_SITE_OTHER): Payer: Self-pay | Admitting: Internal Medicine

## 2022-10-06 DIAGNOSIS — R638 Other symptoms and signs concerning food and fluid intake: Secondary | ICD-10-CM

## 2022-10-09 NOTE — Telephone Encounter (Signed)
LAST APPOINTMENT DATE: 10/05/2022 NEXT APPOINTMENT DATE: 10/24/2022   [id number] - [place] ([place]), [place] - [address] [place] ([place]) [place] [address] Phone: [phone] Fax: [phone]  Patient is requesting a refill of the following medications: Requested Prescriptions   Pending Prescriptions Disp Refills   topiramate (TOPAMAX) 25 MG tablet 60 tablet 0    Sig: 2 tabs daily in morning    Date last filled: 08/30/2022 Previously prescribed by Worthy Rancher  Lab Results  Component Value Date   HGBA1C 5.5 06/09/2021   HGBA1C 5.5 03/09/2021   HGBA1C 5.5 10/13/2020   Lab Results  Component Value Date   LDLCALC 100 (H) 06/09/2021   CREATININE 0.51 (L) 06/09/2021   Lab Results  Component Value Date   VD25OH 67.3 06/09/2021   VD25OH 69.9 03/09/2021   VD25OH 77.9 10/13/2020    BP Readings from Last 3 Encounters:  10/05/22 114/77  08/30/22 96/66  07/31/22 114/79

## 2022-10-10 MED ORDER — TOPIRAMATE 25 MG PO TABS
ORAL_TABLET | ORAL | 0 refills | Status: DC
Start: 2022-10-10 — End: 2022-10-24

## 2022-10-13 ENCOUNTER — Other Ambulatory Visit (INDEPENDENT_AMBULATORY_CARE_PROVIDER_SITE_OTHER): Payer: Self-pay | Admitting: Internal Medicine

## 2022-10-13 DIAGNOSIS — R638 Other symptoms and signs concerning food and fluid intake: Secondary | ICD-10-CM

## 2022-10-24 ENCOUNTER — Encounter (INDEPENDENT_AMBULATORY_CARE_PROVIDER_SITE_OTHER): Payer: Self-pay | Admitting: Internal Medicine

## 2022-10-24 ENCOUNTER — Ambulatory Visit (INDEPENDENT_AMBULATORY_CARE_PROVIDER_SITE_OTHER): Payer: BC Managed Care – PPO | Admitting: Internal Medicine

## 2022-10-24 DIAGNOSIS — R638 Other symptoms and signs concerning food and fluid intake: Secondary | ICD-10-CM

## 2022-10-24 DIAGNOSIS — Z6830 Body mass index (BMI) 30.0-30.9, adult: Secondary | ICD-10-CM | POA: Diagnosis not present

## 2022-10-24 DIAGNOSIS — E669 Obesity, unspecified: Secondary | ICD-10-CM

## 2022-10-24 MED ORDER — PHENTERMINE-TOPIRAMATE 7.5-46 MG PO CP24
1.0000 | ORAL_CAPSULE | Freq: Every day | ORAL | 0 refills | Status: DC
Start: 2022-10-24 — End: 2022-12-20

## 2022-10-24 MED ORDER — QSYMIA 3.75-23 MG PO CP24
1.0000 | ORAL_CAPSULE | Freq: Every day | ORAL | 0 refills | Status: DC
Start: 2022-10-24 — End: 2022-12-20

## 2022-10-24 NOTE — Assessment & Plan Note (Signed)
Neurohormonal in origin.  She denies symptoms of depression, in addition she feels that bupropion is no longer of benefit.  I think we need to change medical therapy.  Incretin therapy is cost prohibitive.  After discussion of benefits and side effects she is agreeable to trying Qsymia. I provided her with information regarding medication.  She will monitor for symptoms of depression after discontinuing Wellbutrin.

## 2022-10-24 NOTE — Progress Notes (Signed)
Office: [phone]  /  Fax: [phone]  WEIGHT SUMMARY AND BIOMETRICS  Vitals Temp: 97.8 F (36.6 C) BP: 125/82 Pulse Rate: 77 SpO2: 98 %   Anthropometric Measurements Height: 5\' 2"  (1.575 m) Weight: 168 lb (76.2 kg) BMI (Calculated): 30.72 Weight at Last Visit: 163 lb Weight Lost Since Last Visit: 0 Weight Gained Since Last Visit: 5 lb Starting Weight: 197 lb Total Weight Loss (lbs): 29 lb (13.2 kg) Peak Weight: 197 lb   Body Composition  Body Fat %: 39.6 % Fat Mass (lbs): 66.6 lbs Muscle Mass (lbs): 96.2 lbs Total Body Water (lbs): 68.4 lbs Visceral Fat Rating : 10    No data recorded Today's Visit #: 37  Starting Date: 05/13/20   HPI  Chief Complaint: OBESITY  Carmen Garza is here to discuss her progress with her obesity treatment plan. She is on the the Category 2 Plan and states she is following her eating plan approximately 50 % of the time. She states she is exercising, cardio and weights 30 minutes 4 times per week.  Interval History:  Carmen Garza presents today for medication management.  She tapered bupropion and topiramate off because of perceived lack of clinical benefit.  She has gained 5 pounds with stopping of medications.  She she will be transitioning to Qsymia to assist with weight loss plateau and metabolic adaptations.  Orixegenic Control: Reports problems with appetite and hunger signals.  Denies problems with satiety and satiation.  Denies problems with eating patterns and portion control.  Denies abnormal cravings. Denies feeling deprived or restricted.   Barriers identified: strong hunger signals and appetite.   Pharmacotherapy for weight loss: She is currently taking  tapered down topiramate and bupropion .    ASSESSMENT AND PLAN  TREATMENT PLAN FOR OBESITY:  Recommended Dietary Goals  Carmen Garza is currently in the action stage of change. As such, her goal is to continue weight management plan. She has agreed to: continue current  plan  Behavioral Intervention  We discussed the following Behavioral Modification Strategies today: increasing lean protein intake, decreasing simple carbohydrates , increasing vegetables, increasing lower glycemic fruits, increasing water intake, and planning for success.  Additional resources provided today: Handout on FDA approved anti-obesity medications and adverse effects  Recommended Physical Activity Goals  Carmen Garza has been advised to work up to 150 minutes of moderate intensity aerobic activity a week and strengthening exercises 2-3 times per week for cardiovascular health, weight loss maintenance and preservation of muscle mass.   She has agreed to :  Think about ways to increase daily physical activity and overcoming barriers to exercise  Pharmacotherapy We discussed various medication options to help Carmen Garza with her weight loss efforts and we both agreed to : start anti-obesity medication.  In addition to reduced calorie nutrition plan (RCNP), behavioral strategies and physical activity, Carmen Garza would benefit from pharmacotherapy to assist with hunger signals, satiety and cravings. This will reduce obesity-related health risks by inducing weight loss, and help reduce food consumption and adherence to [facility]) . It may also improve QOL by improving self-confidence and reduce the  setbacks associated with metabolic adaptations.  After discussion of treatment options, mechanisms of action, benefits, side effects, contraindications and shared decision making she is agreeable to starting Qsymia. Patient also made aware that medication is indicated for long-term management of obesity and the risk of weight regain following discontinuation of treatment and hence the importance of adhering to medical weight loss plan.    Patient is low risk for pregnancy as  she is postmenopausal We reviewed [place] controlled substance database no other controlled substances on file. We reviewed  controlled antiobesity medication agreement and signed today  ASSOCIATED CONDITIONS ADDRESSED TODAY  Abnormal craving Assessment & Plan: Neurohormonal in origin.  She denies symptoms of depression, in addition she feels that bupropion is no longer of benefit.  I think we need to change medical therapy.  Incretin therapy is cost prohibitive.  After discussion of benefits and side effects she is agreeable to trying Qsymia. I provided her with information regarding medication.  She will monitor for symptoms of depression after discontinuing Wellbutrin.  Orders: -     Phentermine-Topiramate; Take 1 capsule by mouth daily.  Dispense: 30 capsule; Refill: 0  Obesity with current BMI of 30 Assessment & Plan: Overall she has lost 18% of baseline body weight and is reaching a plateau she will start journaling and tracking calories 3 to 4 days out of the week and would incorporate strengthening exercises into her routine.  We are discontinuing bupropion and topiramate she will be started on Qsymia 3.75 mg / 23 mg 1 tablet daily for 14 days and then increase to 7.5 mg / 46 mg 1 tablet in the morning.  Orders: -     Qsymia; Take 1 capsule by mouth daily.  Dispense: 14 capsule; Refill: 0 -     Phentermine-Topiramate; Take 1 capsule by mouth daily.  Dispense: 30 capsule; Refill: 0    PHYSICAL EXAM:  Blood pressure 125/82, pulse 77, temperature 97.8 F (36.6 C), height 5\' 2"  (1.575 m), weight 168 lb (76.2 kg), SpO2 98%. Body mass index is 30.73 kg/m.  General: She is overweight, cooperative, alert, well developed, and in no acute distress. PSYCH: Has normal mood, affect and thought process.   HEENT: EOMI, sclerae are anicteric. Lungs: Normal breathing effort, no conversational dyspnea. Extremities: No edema.  Neurologic: No gross sensory or motor deficits. No tremors or fasciculations noted.    DIAGNOSTIC DATA REVIEWED:  BMET    Component Value Date/Time   NA 141 06/09/2021 0740   K 4.8  06/09/2021 0740   CL 102 06/09/2021 0740   CO2 26 06/09/2021 0740   GLUCOSE 96 06/09/2021 0740   GLUCOSE 97 07/05/2011 1034   BUN 18 06/09/2021 0740   CREATININE 0.51 (L) 06/09/2021 0740   CALCIUM 9.5 06/09/2021 0740   GFRNONAA 107 05/13/2020 0852   GFRAA 123 05/13/2020 0852   Lab Results  Component Value Date   HGBA1C 5.5 06/09/2021   HGBA1C 5.8 (H) 05/13/2020   Lab Results  Component Value Date   INSULIN 14.6 06/09/2021   INSULIN 17.7 05/13/2020   Lab Results  Component Value Date   TSH 1.100 06/09/2021   CBC    Component Value Date/Time   WBC 4.8 06/09/2021 0740   WBC 7.9 03/25/2012 1112   WBC 14.2 (H) 06/24/2009 0512   RBC 4.71 06/09/2021 0740   RBC 4.65 03/25/2012 1112   RBC 4.36 06/24/2009 0512   HGB 14.2 06/09/2021 0740   HGB 14.0 03/25/2012 1112   HCT 42.6 06/09/2021 0740   HCT 41.7 03/25/2012 1112   PLT 237 06/09/2021 0740   MCV 90 06/09/2021 0740   MCV 89.7 03/25/2012 1112   MCH 30.1 06/09/2021 0740   MCH 30.2 03/25/2012 1112   MCHC 33.3 06/09/2021 0740   MCHC 33.6 03/25/2012 1112   MCHC 33.4 06/24/2009 0512   RDW 12.0 06/09/2021 0740   RDW 12.8 03/25/2012 1112   Iron Studies  Component Value Date/Time   IRON 72 05/13/2020 0852   TIBC 321 05/13/2020 0852   FERRITIN 203 (H) 05/13/2020 0852   IRONPCTSAT 22 05/13/2020 0852   Lipid Panel     Component Value Date/Time   CHOL 184 06/09/2021 0740   TRIG 114 06/09/2021 0740   HDL 64 06/09/2021 0740   CHOLHDL 2.9 05/13/2020 0852   CHOLHDL 4.4 CALC 07/22/2007 1008   VLDL 17 07/22/2007 1008   LDLCALC 100 (H) 06/09/2021 0740   LDLDIRECT 143.6 07/22/2007 1008   Hepatic Function Panel     Component Value Date/Time   PROT 7.2 06/09/2021 0740   ALBUMIN 4.6 06/09/2021 0740   AST 18 06/09/2021 0740   ALT 24 06/09/2021 0740   ALKPHOS 65 06/09/2021 0740   BILITOT 0.5 06/09/2021 0740   BILIDIR 0.2 07/22/2007 1008      Component Value Date/Time   TSH 1.100 06/09/2021 0740   Nutritional Lab  Results  Component Value Date   VD25OH 67.3 06/09/2021   VD25OH 69.9 03/09/2021   VD25OH 77.9 10/13/2020     Return in about 3 weeks (around 11/14/2022) for For Weight Mangement with Dr. Rikki Spearing.Marland Kitchen She was informed of the importance of frequent follow up visits to maximize her success with intensive lifestyle modifications for her multiple health conditions.   ATTESTASTION STATEMENTS:  Reviewed by clinician on day of visit: allergies, medications, problem list, medical history, surgical history, family history, social history, and previous encounter notes.     Worthy Rancher, MD

## 2022-10-24 NOTE — Assessment & Plan Note (Signed)
Overall she has lost 18% of baseline body weight and is reaching a plateau she will start journaling and tracking calories 3 to 4 days out of the week and would incorporate strengthening exercises into her routine.  We are discontinuing bupropion and topiramate she will be started on Qsymia 3.75 mg / 23 mg 1 tablet daily for 14 days and then increase to 7.5 mg / 46 mg 1 tablet in the morning.

## 2022-10-27 ENCOUNTER — Ambulatory Visit
Admission: RE | Admit: 2022-10-27 | Discharge: 2022-10-27 | Disposition: A | Discharge status: Home / Self Care | Payer: BC Managed Care – PPO | Source: Ambulatory Visit | Attending: Family Medicine | Admitting: Family Medicine

## 2022-10-27 DIAGNOSIS — Z1231 Encounter for screening mammogram for malignant neoplasm of breast: Secondary | ICD-10-CM

## 2022-11-07 ENCOUNTER — Ambulatory Visit: Admission: RE | Admit: 2022-11-07 | Payer: BC Managed Care – PPO | Source: Ambulatory Visit

## 2022-11-07 DIAGNOSIS — M8588 Other specified disorders of bone density and structure, other site: Secondary | ICD-10-CM | POA: Diagnosis not present

## 2022-11-07 DIAGNOSIS — E349 Endocrine disorder, unspecified: Secondary | ICD-10-CM | POA: Diagnosis not present

## 2022-11-07 DIAGNOSIS — Z90722 Acquired absence of ovaries, bilateral: Secondary | ICD-10-CM | POA: Diagnosis not present

## 2022-11-07 DIAGNOSIS — Z8262 Family history of osteoporosis: Secondary | ICD-10-CM | POA: Diagnosis not present

## 2022-11-22 ENCOUNTER — Other Ambulatory Visit: Payer: BC Managed Care – PPO

## 2022-11-22 ENCOUNTER — Encounter: Payer: BC Managed Care – PPO | Admitting: Genetic Counselor

## 2022-11-23 ENCOUNTER — Ambulatory Visit (INDEPENDENT_AMBULATORY_CARE_PROVIDER_SITE_OTHER): Payer: BC Managed Care – PPO | Admitting: Internal Medicine

## 2022-12-18 ENCOUNTER — Ambulatory Visit (INDEPENDENT_AMBULATORY_CARE_PROVIDER_SITE_OTHER): Payer: BC Managed Care – PPO | Admitting: Internal Medicine

## 2022-12-20 ENCOUNTER — Ambulatory Visit (INDEPENDENT_AMBULATORY_CARE_PROVIDER_SITE_OTHER): Payer: BC Managed Care – PPO | Admitting: Internal Medicine

## 2022-12-20 ENCOUNTER — Encounter (INDEPENDENT_AMBULATORY_CARE_PROVIDER_SITE_OTHER): Payer: Self-pay | Admitting: Internal Medicine

## 2022-12-20 VITALS — BP 109/75 | HR 82 | Temp 98.0°F | Ht 62.0 in | Wt 171.0 lb

## 2022-12-20 DIAGNOSIS — R7303 Prediabetes: Secondary | ICD-10-CM

## 2022-12-20 DIAGNOSIS — E669 Obesity, unspecified: Secondary | ICD-10-CM | POA: Diagnosis not present

## 2022-12-20 DIAGNOSIS — R638 Other symptoms and signs concerning food and fluid intake: Secondary | ICD-10-CM | POA: Diagnosis not present

## 2022-12-20 DIAGNOSIS — R948 Abnormal results of function studies of other organs and systems: Secondary | ICD-10-CM | POA: Diagnosis not present

## 2022-12-20 DIAGNOSIS — Z6830 Body mass index (BMI) 30.0-30.9, adult: Secondary | ICD-10-CM

## 2022-12-20 NOTE — Assessment & Plan Note (Signed)
Patient has a slower than predicted metabolism. IC 1339 vs. calculated 1424. This may contribute to weight gain, chronic fatigue and difficulty losing weight.   We reviewed measures to improve metabolism including not skipping meals, progressive strengthening exercises, increasing protein intake at every meal and maintaining adequate hydration and sleep.   We will repeat IC at the next office visit.  I suspect she may have further decreases in metabolism due to chronic calorie restriction or adaptive thermogenesis.  I encouraged her to increase volume of physical activity she is starting to go to the gym with her daughter-in-law.

## 2022-12-20 NOTE — Progress Notes (Signed)
Office: [phone]  /  Fax: [phone]  WEIGHT SUMMARY AND BIOMETRICS  Vitals Temp: 98 F (36.7 C) BP: 109/75 Pulse Rate: 82 SpO2: 97 %   Anthropometric Measurements Height: 5\' 2"  (1.575 m) Weight: 171 lb (77.6 kg) BMI (Calculated): 31.27 Weight at Last Visit: 168 lb Weight Lost Since Last Visit: 0 lb Weight Gained Since Last Visit: 3 lb Starting Weight: 197 lb Total Weight Loss (lbs): 26 lb (11.8 kg) Peak Weight: 197 lb   Body Composition  Body Fat %: 39.7 % Fat Mass (lbs): 68.2 lbs Muscle Mass (lbs): 98.2 lbs Total Body Water (lbs): 67.4 lbs Visceral Fat Rating : 10    No data recorded Today's Visit #: 38  Starting Date: 05/13/20   HPI  Chief Complaint: OBESITY  Carmen Garza is here to discuss her progress with her obesity treatment plan. She is on the the Category 2 Plan and states she is following her eating plan approximately 50 % of the time. She states she is exercising 30 minutes 2-3 times per week.  Interval History:  Since last office visit she has gained 3 pounds.  Last office visit we had prescribed Qsymia but she states medication was not at the pharmacy.  She reports she is no longer interested in antiobesity medication. She reports suboptimal adherence She has been working on begun to exercise and getting back on track following recent lapse.  She is experiencing some dieting fatigue and is tired of the monotony of daily calorie restriction.  She also has not been tracking or journaling.  Orexigenic Control: Reports problems with appetite and hunger signals.  Reports problems with satiety and satiation.  Denies problems with eating patterns and portion control.  Denies abnormal cravings. Denies feeling deprived or restricted.   Barriers identified: strong hunger signals and appetite, slow metabolism for age, and metabolic adaptations associated with chronic dieting .   Pharmacotherapy for weight loss: She is currently taking no anti-obesity  medication and had been prescribed Qsymia at last office visit but medication was not filled.  She is hesitant about starting antiobesity medications. .    ASSESSMENT AND PLAN  TREATMENT PLAN FOR OBESITY:  Recommended Dietary Goals  Carmen Garza is currently in the action stage of change. As such, her goal is to continue weight management plan. She has agreed to:  We will repeat her indirect calorimetry at the next office visit.  She is having dieting fatigue and is tired of the monotony associated with daily calorie restriction.  We discussed incorporating meal replacements 1 to 2/day and also doing intermittent fasting 5:2 plan, where she consumes 1200 cal 5 days out of the week and 500 to 600 cal on 2 nonconsecutive days.  Behavioral Intervention  We discussed the following Behavioral Modification Strategies today: increasing lean protein intake, decreasing simple carbohydrates , increasing vegetables, increasing lower glycemic fruits, increasing fiber rich foods, avoiding skipping meals, increasing water intake, work on meal planning and preparation, decreasing eating out or consumption of processed foods, and making healthy choices when eating convenient foods, avoiding temptations and identifying enticing environmental cues, and planning for success.  Additional resources provided today: Handout on intermittent fasting  Recommended Physical Activity Goals  Carmen Garza has been advised to work up to 150 minutes of moderate intensity aerobic activity a week and strengthening exercises 2-3 times per week for cardiovascular health, weight loss maintenance and preservation of muscle mass.   She has agreed to :  She has started to go to the gym with her  daughter-in-law and will work on increasing volume of physical activity.  Pharmacotherapy We discussed various medication options to help Carmen Garza with her weight loss efforts and we both agreed to : has declined pharmacotherapy, I strongly feel that she  would benefit from antiobesity medication at this time she is not interested.  She does not have coverage for incretin therapy I think Qsymia may be a reasonable option.  ASSOCIATED CONDITIONS ADDRESSED TODAY  Abnormal food appetite Assessment & Plan: She has increased orexigenic signaling, impaired satiety and inhibitory control. This is secondary to an abnormal energy regulation system and pathological neurohormonal pathways characteristic of excess adiposity.  In addition to nutritional and behavioral strategies she benefits from pharmacotherapy which at present she declines.    Abnormal metabolism Assessment & Plan: Patient has a slower than predicted metabolism. IC 1339 vs. calculated 1424. This may contribute to weight gain, chronic fatigue and difficulty losing weight.   We reviewed measures to improve metabolism including not skipping meals, progressive strengthening exercises, increasing protein intake at every meal and maintaining adequate hydration and sleep.   We will repeat IC at the next office visit.  I suspect she may have further decreases in metabolism due to chronic calorie restriction or adaptive thermogenesis.  I encouraged her to increase volume of physical activity she is starting to go to the gym with her daughter-in-law.    Obesity with current BMI of 30 Assessment & Plan: Overall she has lost 18% of baseline body weight has reached a plateau and has started to gain weight.  Please see obesity treatment plan   Pre-diabetes Assessment & Plan: Most recent A1c was 5.5.Her HOMA- IR is 1.88 and down from 3.46 which shows an improvement in insulin resistance.  Her insurance does not cover incretin therapy.  We will repeat hemoglobin A1c, fasting blood glucose and insulin levels at the next office visit.  We will consider pharmacoprophylaxis with metformin if patient agrees and glycemic parameters have worsened.     PHYSICAL EXAM:  Blood pressure 109/75, pulse 82,  temperature 98 F (36.7 C), height 5\' 2"  (1.575 m), weight 171 lb (77.6 kg), SpO2 97%. Body mass index is 31.28 kg/m.  General: She is overweight, cooperative, alert, well developed, and in no acute distress. PSYCH: Has normal mood, affect and thought process.   HEENT: EOMI, sclerae are anicteric. Lungs: Normal breathing effort, no conversational dyspnea. Extremities: No edema.  Neurologic: No gross sensory or motor deficits. No tremors or fasciculations noted.    DIAGNOSTIC DATA REVIEWED:  BMET    Component Value Date/Time   NA 141 06/09/2021 0740   K 4.8 06/09/2021 0740   CL 102 06/09/2021 0740   CO2 26 06/09/2021 0740   GLUCOSE 96 06/09/2021 0740   GLUCOSE 97 07/05/2011 1034   BUN 18 06/09/2021 0740   CREATININE 0.51 (L) 06/09/2021 0740   CALCIUM 9.5 06/09/2021 0740   GFRNONAA 107 05/13/2020 0852   GFRAA 123 05/13/2020 0852   Lab Results  Component Value Date   HGBA1C 5.5 06/09/2021   HGBA1C 5.8 (H) 05/13/2020   Lab Results  Component Value Date   INSULIN 14.6 06/09/2021   INSULIN 17.7 05/13/2020   Lab Results  Component Value Date   TSH 1.100 06/09/2021   CBC    Component Value Date/Time   WBC 4.8 06/09/2021 0740   WBC 7.9 03/25/2012 1112   WBC 14.2 (H) 06/24/2009 0512   RBC 4.71 06/09/2021 0740   RBC 4.65 03/25/2012 1112   RBC 4.36  06/24/2009 0512   HGB 14.2 06/09/2021 0740   HGB 14.0 03/25/2012 1112   HCT 42.6 06/09/2021 0740   HCT 41.7 03/25/2012 1112   PLT 237 06/09/2021 0740   MCV 90 06/09/2021 0740   MCV 89.7 03/25/2012 1112   MCH 30.1 06/09/2021 0740   MCH 30.2 03/25/2012 1112   MCHC 33.3 06/09/2021 0740   MCHC 33.6 03/25/2012 1112   MCHC 33.4 06/24/2009 0512   RDW 12.0 06/09/2021 0740   RDW 12.8 03/25/2012 1112   Iron Studies    Component Value Date/Time   IRON 72 05/13/2020 0852   TIBC 321 05/13/2020 0852   FERRITIN 203 (H) 05/13/2020 0852   IRONPCTSAT 22 05/13/2020 0852   Lipid Panel     Component Value Date/Time   CHOL 184  06/09/2021 0740   TRIG 114 06/09/2021 0740   HDL 64 06/09/2021 0740   CHOLHDL 2.9 05/13/2020 0852   CHOLHDL 4.4 CALC 07/22/2007 1008   VLDL 17 07/22/2007 1008   LDLCALC 100 (H) 06/09/2021 0740   LDLDIRECT 143.6 07/22/2007 1008   Hepatic Function Panel     Component Value Date/Time   PROT 7.2 06/09/2021 0740   ALBUMIN 4.6 06/09/2021 0740   AST 18 06/09/2021 0740   ALT 24 06/09/2021 0740   ALKPHOS 65 06/09/2021 0740   BILITOT 0.5 06/09/2021 0740   BILIDIR 0.2 07/22/2007 1008      Component Value Date/Time   TSH 1.100 06/09/2021 0740   Nutritional Lab Results  Component Value Date   VD25OH 67.3 06/09/2021   VD25OH 69.9 03/09/2021   VD25OH 77.9 10/13/2020     Return in about 5 weeks (around 01/24/2023) for fasting for IC, needs 40 minutes and arrive 40 minutes before... She was informed of the importance of frequent follow up visits to maximize her success with intensive lifestyle modifications for her multiple health conditions.   ATTESTASTION STATEMENTS:  Reviewed by clinician on day of visit: allergies, medications, problem list, medical history, surgical history, family history, social history, and previous encounter notes.     Worthy Rancher, MD

## 2022-12-20 NOTE — Assessment & Plan Note (Signed)
Overall she has lost 18% of baseline body weight has reached a plateau and has started to gain weight.  Please see obesity treatment plan

## 2022-12-20 NOTE — Assessment & Plan Note (Signed)
She has increased orexigenic signaling, impaired satiety and inhibitory control. This is secondary to an abnormal energy regulation system and pathological neurohormonal pathways characteristic of excess adiposity.  In addition to nutritional and behavioral strategies she benefits from pharmacotherapy which at present she declines.

## 2022-12-20 NOTE — Assessment & Plan Note (Signed)
Most recent A1c was 5.5.Her HOMA- IR is 1.88 and down from 3.46 which shows an improvement in insulin resistance.  Her insurance does not cover incretin therapy.  We will repeat hemoglobin A1c, fasting blood glucose and insulin levels at the next office visit.  We will consider pharmacoprophylaxis with metformin if patient agrees and glycemic parameters have worsened.

## 2023-01-25 ENCOUNTER — Encounter (INDEPENDENT_AMBULATORY_CARE_PROVIDER_SITE_OTHER): Payer: Self-pay | Admitting: Internal Medicine

## 2023-01-25 ENCOUNTER — Ambulatory Visit (INDEPENDENT_AMBULATORY_CARE_PROVIDER_SITE_OTHER): Payer: BC Managed Care – PPO | Admitting: Internal Medicine

## 2023-01-25 VITALS — BP 110/74 | HR 74 | Temp 98.0°F | Ht 62.0 in | Wt 170.0 lb

## 2023-01-25 DIAGNOSIS — Z6836 Body mass index (BMI) 36.0-36.9, adult: Secondary | ICD-10-CM | POA: Diagnosis not present

## 2023-01-25 DIAGNOSIS — E66812 Obesity, class 2: Secondary | ICD-10-CM

## 2023-01-25 DIAGNOSIS — R7303 Prediabetes: Secondary | ICD-10-CM

## 2023-01-25 NOTE — Progress Notes (Signed)
Office: [phone]  /  Fax: [phone]  WEIGHT SUMMARY AND BIOMETRICS  Vitals Temp: 98 F (36.7 C) BP: 110/74 Pulse Rate: 74 SpO2: 100 %   Anthropometric Measurements Height: 5\' 2"  (1.575 m) Weight: 170 lb (77.1 kg) BMI (Calculated): 31.09 Weight at Last Visit: 171 lb Weight Lost Since Last Visit: 1 lb Weight Gained Since Last Visit: 0 lb Starting Weight: 197 lb Total Weight Loss (lbs): 27 lb (12.2 kg) Peak Weight: 197 lb   Body Composition  Body Fat %: 41.1 % Fat Mass (lbs): 68.4 lbs Muscle Mass (lbs): 102.2 lbs Total Body Water (lbs): 66.6 lbs Visceral Fat Rating : 10    No data recorded Today's Visit #: 39  Starting Date: 05/13/20   HPI  Chief Complaint: OBESITY  Carmen Garza is here to discuss her progress with her obesity treatment plan. She is on the the Category 2 Plan and states she is following her eating plan approximately 50 % of the time. She states she is exercising 45 minutes 3-4  times per week.  Interval History:   Discussed the use of AI scribe software for clinical note transcription with the patient, who gave verbal consent to proceed.  History of Present Illness    The patient, with a history of obesity, presents for a follow-up visit. They report recent weight loss, albeit minimal, which they attribute to increased physical activity and dietary changes. They have been attending the gym regularly with their daughter in law and have started incorporating protein shakes into their diet.   In the past, the patient has experienced significant weight fluctuations. Their peak weight was 197 lbs in 2022, which prompted them to start a weight loss program. Within a year, they successfully reduced their weight to 150 lbs. However, during the holiday season of 2023, they began to regain weight, and their baseline weight increased to 161 lbs. Since then, their weight has been relatively stable until June 2024, when they noticed a gradual  increase.  The patient has been trying to manage their weight without the aid of weight loss medications, expressing concerns about relying on such treatments. However, they acknowledge the struggle of maintaining their weight loss and the constant feeling of their body fighting against their efforts. They express interest in continuing with meal replacements and plan to be more consistent with this approach.  Despite the challenges, the patient remains committed to their weight loss journey. They express confidence in their ability to regain control over their weight and are open to exploring different strategies to achieve their weight loss goals. They are particularly interested in the potential benefits of meal replacements and are considering incorporating them more consistently into their diet. They also express a willingness to consider weight loss medications in the future if necessary.       Pharmacotherapy for weight loss: She is currently taking patient has declined pharmacotherapy in past.    ASSESSMENT AND PLAN  TREATMENT PLAN FOR OBESITY:  Recommended Dietary Goals  Carmen Garza is currently in the action stage of change. As such, her goal is to continue weight management plan. She has agreed to: Target to stay under 1400 cal/day she will incorporate 1 meal replacement every day and increase to 2 meal replacements 2 days out of the week.  She was provided with samples of [employer]  Behavioral Intervention  We discussed the following Behavioral Modification Strategies today: continue to work on maintaining a reduced calorie state, getting the recommended amount of protein, incorporating whole foods,  making healthy choices, staying well hydrated and practicing mindfulness when eating..  Additional resources provided today: None  Recommended Physical Activity Goals  Casey has been advised to work up to 150 minutes of moderate intensity aerobic activity a week and strengthening exercises  2-3 times per week for cardiovascular health, weight loss maintenance and preservation of muscle mass.   She has agreed to :  Think about enjoyable ways to increase daily physical activity and overcoming barriers to exercise and Increase physical activity in their day and reduce sedentary time (increase NEAT).  Pharmacotherapy We discussed various medication options to help Carmen Garza with her weight loss efforts and we both agreed to : continue with nutritional and behavioral strategies  ASSOCIATED CONDITIONS ADDRESSED TODAY  Prediabetes Assessment & Plan: Improved.  She gets blood work at her PCPs office reports A1c has been less than 5.7.  She has declined pharmacoprophylaxis with metformin.  She will continue to work on nutritional and behavioral strategies for weight management.   Class 2 severe obesity with serious comorbidity and body mass index (BMI) of 36.0 to 36.9 in adult, unspecified obesity type [name]) Assessment & Plan: Peak Weight: 197 in 2022 Starting Weight : 197 Current weight: 170  Working BMR: 1400 Weight loss goal: 10 to 15% Nutritional: 1200 cal with use of meal replacements Past Pharmacotherapy: None Current Pharmacotherapy: Patient has declined on multiple occasions  Patient declined repeat indirect calorimetry today to check on metabolism due to allergies. Contributing factors: Other: Adaptive thermogenesis status from chronic dieting       PHYSICAL EXAM:  Blood pressure 110/74, pulse 74, temperature 98 F (36.7 C), height 5\' 2"  (1.575 m), weight 170 lb (77.1 kg), SpO2 100%. Body mass index is 31.09 kg/m.  General: She is overweight, cooperative, alert, well developed, and in no acute distress. PSYCH: Has normal mood, affect and thought process.   HEENT: EOMI, sclerae are anicteric. Lungs: Normal breathing effort, no conversational dyspnea. Extremities: No edema.  Neurologic: No gross sensory or motor deficits. No tremors or fasciculations noted.     DIAGNOSTIC DATA REVIEWED:  BMET    Component Value Date/Time   NA 141 06/09/2021 0740   K 4.8 06/09/2021 0740   CL 102 06/09/2021 0740   CO2 26 06/09/2021 0740   GLUCOSE 96 06/09/2021 0740   GLUCOSE 97 07/05/2011 1034   BUN 18 06/09/2021 0740   CREATININE 0.51 (L) 06/09/2021 0740   CALCIUM 9.5 06/09/2021 0740   GFRNONAA 107 05/13/2020 0852   GFRAA 123 05/13/2020 0852   Lab Results  Component Value Date   HGBA1C 5.5 06/09/2021   HGBA1C 5.8 (H) 05/13/2020   Lab Results  Component Value Date   INSULIN 14.6 06/09/2021   INSULIN 17.7 05/13/2020   Lab Results  Component Value Date   TSH 1.100 06/09/2021   CBC    Component Value Date/Time   WBC 4.8 06/09/2021 0740   WBC 7.9 03/25/2012 1112   WBC 14.2 (H) 06/24/2009 0512   RBC 4.71 06/09/2021 0740   RBC 4.65 03/25/2012 1112   RBC 4.36 06/24/2009 0512   HGB 14.2 06/09/2021 0740   HGB 14.0 03/25/2012 1112   HCT 42.6 06/09/2021 0740   HCT 41.7 03/25/2012 1112   PLT 237 06/09/2021 0740   MCV 90 06/09/2021 0740   MCV 89.7 03/25/2012 1112   MCH 30.1 06/09/2021 0740   MCH 30.2 03/25/2012 1112   MCHC 33.3 06/09/2021 0740   MCHC 33.6 03/25/2012 1112   MCHC 33.4 06/24/2009 0512  RDW 12.0 06/09/2021 0740   RDW 12.8 03/25/2012 1112   Iron Studies    Component Value Date/Time   IRON 72 05/13/2020 0852   TIBC 321 05/13/2020 0852   FERRITIN 203 (H) 05/13/2020 0852   IRONPCTSAT 22 05/13/2020 0852   Lipid Panel     Component Value Date/Time   CHOL 184 06/09/2021 0740   TRIG 114 06/09/2021 0740   HDL 64 06/09/2021 0740   CHOLHDL 2.9 05/13/2020 0852   CHOLHDL 4.4 CALC 07/22/2007 1008   VLDL 17 07/22/2007 1008   LDLCALC 100 (H) 06/09/2021 0740   LDLDIRECT 143.6 07/22/2007 1008   Hepatic Function Panel     Component Value Date/Time   PROT 7.2 06/09/2021 0740   ALBUMIN 4.6 06/09/2021 0740   AST 18 06/09/2021 0740   ALT 24 06/09/2021 0740   ALKPHOS 65 06/09/2021 0740   BILITOT 0.5 06/09/2021 0740   BILIDIR  0.2 07/22/2007 1008      Component Value Date/Time   TSH 1.100 06/09/2021 0740   Nutritional Lab Results  Component Value Date   VD25OH 67.3 06/09/2021   VD25OH 69.9 03/09/2021   VD25OH 77.9 10/13/2020     Return in about 4 weeks (around 02/22/2023) for For Weight Mangement with Dr. Rikki Spearing.Marland Kitchen She was informed of the importance of frequent follow up visits to maximize her success with intensive lifestyle modifications for her multiple health conditions.   ATTESTASTION STATEMENTS:  Reviewed by clinician on day of visit: allergies, medications, problem list, medical history, surgical history, family history, social history, and previous encounter notes.     Worthy Rancher, MD

## 2023-01-25 NOTE — Assessment & Plan Note (Signed)
Improved.  She gets blood work at her PCPs office reports A1c has been less than 5.7.  She has declined pharmacoprophylaxis with metformin.  She will continue to work on nutritional and behavioral strategies for weight management.

## 2023-01-25 NOTE — Assessment & Plan Note (Addendum)
Peak Weight: 197 in 2022 Starting Weight : 197 Current weight: 170  Working BMR: 1400 Weight loss goal: 10 to 15% Nutritional: 1200 cal with use of meal replacements Past Pharmacotherapy: None Current Pharmacotherapy: Patient has declined on multiple occasions  Patient declined repeat indirect calorimetry today to check on metabolism due to allergies. Contributing factors: Other: Adaptive thermogenesis status from chronic dieting

## 2023-03-01 ENCOUNTER — Encounter (INDEPENDENT_AMBULATORY_CARE_PROVIDER_SITE_OTHER): Payer: Self-pay | Admitting: Internal Medicine

## 2023-03-01 ENCOUNTER — Ambulatory Visit (INDEPENDENT_AMBULATORY_CARE_PROVIDER_SITE_OTHER): Payer: BC Managed Care – PPO | Admitting: Internal Medicine

## 2023-03-01 VITALS — BP 104/69 | HR 73 | Temp 98.1°F | Ht 62.0 in | Wt 169.0 lb

## 2023-03-01 DIAGNOSIS — E669 Obesity, unspecified: Secondary | ICD-10-CM | POA: Diagnosis not present

## 2023-03-01 DIAGNOSIS — E7800 Pure hypercholesterolemia, unspecified: Secondary | ICD-10-CM

## 2023-03-01 DIAGNOSIS — R7303 Prediabetes: Secondary | ICD-10-CM

## 2023-03-01 DIAGNOSIS — Z6830 Body mass index (BMI) 30.0-30.9, adult: Secondary | ICD-10-CM | POA: Diagnosis not present

## 2023-03-01 DIAGNOSIS — E7849 Other hyperlipidemia: Secondary | ICD-10-CM

## 2023-03-01 DIAGNOSIS — E66812 Obesity, class 2: Secondary | ICD-10-CM

## 2023-03-01 DIAGNOSIS — E88819 Insulin resistance, unspecified: Secondary | ICD-10-CM

## 2023-03-01 NOTE — Progress Notes (Signed)
Office: [phone]  /  Fax: [phone]  Weight Summary And Biometrics  Vitals Temp: 98.1 F (36.7 C) BP: 104/69 Pulse Rate: 73 SpO2: 97 %   Anthropometric Measurements Height: 5\' 2"  (1.575 m) Weight: 169 lb (76.7 kg) BMI (Calculated): 30.9 Weight at Last Visit: 170 lb Weight Lost Since Last Visit: 1 lb Weight Gained Since Last Visit: 0 lb Starting Weight: 197 lb Total Weight Loss (lbs): 28 lb (12.7 kg) Peak Weight: 197 lb   Body Composition  Body Fat %: 40.3 % Fat Mass (lbs): 68.4 lbs Muscle Mass (lbs): 96 lbs Total Body Water (lbs): 66.4 lbs Visceral Fat Rating : 10    No data recorded Today's Visit #: 40  Starting Date: 05/13/20   Subjective   Chief Complaint: Obesity  Staria is here to discuss her progress with her obesity treatment plan. She is on the the Category 2 Plan and states she is following her eating plan approximately 50 % of the time. She states she is exercising 40 minutes 3 times per week.  Interval History:   Discussed the use of AI scribe software for clinical note transcription with the patient, who gave verbal consent to proceed.  History of Present Illness   The patient, affected by obesity, insulin resistance, prediabetes, hypercholesterolemia, and a history of ovarian cancer, presents for a follow-up on weight management. She has declined anti-obesity medication in the past and has been incorporating meal replacements to reduce caloric intake. The patient reports a positive experience with this approach, often replacing lunch with a shake.  The patient has noticed a gradual downward trend in her weight over the past few visits, attributing this to a change in attitude and increased gym attendance.    The patient has been making efforts to improve her diet, incorporating protein shakes and focusing on protein intake. She has also been engaging in physical activity, such as using a treadmill, and has been challenging herself to  improve her performance.  The patient's metabolic rate has been a focus of her weight management strategy, with an emphasis on protein intake and physical activity. She has expressed a willingness to consider medication, such as phentermine, if she starts to slip in her progress.  The patient's recent labs show improved insulin levels and resolution of prediabetes. She has been taking vitamin D supplements every other day, and her recent vitamin D level was 68.9. Her LDL was 101, HDL was 62, and TSH was 0.82. Her insulin was 8.4, and her GFR was 108, indicating good kidney function.  In summary, the patient has been making steady progress in her weight management, with a focus on diet modification and physical activity. She has overcome setbacks and is motivated to continue improving her health.      Orexigenic Control:  Denies problems with appetite and hunger signals.  Denies problems with satiety and satiation.  Denies problems with eating patterns and portion control.  Denies abnormal cravings. Denies feeling deprived or restricted.   Barriers identified: none.   Pharmacotherapy for weight loss: She is currently taking patient has declined pharmacotherapy in past.   Assessment and Plan   Treatment Plan For Obesity:  Recommended Dietary Goals  Gissele is currently in the action stage of change. As such, her goal is to continue weight management plan. She has agreed to: continue current plan  Behavioral Intervention  We discussed the following Behavioral Modification Strategies today: continue to work on maintaining a reduced calorie state, getting the recommended amount of  protein, incorporating whole foods, making healthy choices, staying well hydrated and practicing mindfulness when eating..  Additional resources provided today: None  Recommended Physical Activity Goals  Sissy has been advised to work up to 150 minutes of moderate intensity aerobic activity a week and  strengthening exercises 2-3 times per week for cardiovascular health, weight loss maintenance and preservation of muscle mass.   She has agreed to :  continue to gradually increase the amount and intensity of exercise   Pharmacotherapy  We discussed various medication options to help Jeannette with her weight loss efforts and we both agreed to : has declined pharmacotherapy and given information and education on FDA approved anti-obesity medications  Associated Conditions Addressed Today   Assessment and Plan    Obesity   She presents for a follow-up on weight management, noting insulin resistance, prediabetes, and hypercholesterolemia. Despite declining anti-obesity medication multiple times, she has incorporated meal replacements and shown gradual weight loss. A recent rib injury has affected her exercise routine. We emphasized the importance of protein intake, aiming for 90-100 grams per day, and physical activity over calorie restriction. We discussed the potential use of phentermine and topiramate for weight management, noting phentermine's effectiveness up to three years with minimal side effects at low dosages, although topiramate previously caused taste alterations. She prefers a non-medication approach but may consider phentermine if weight management becomes challenging. We will prioritize protein intake, increase physical activity, and track both. Phentermine or topiramate will be considered if weight management becomes challenging.  Prediabetes  / Insulin Resistance Reviewed labs form 05/2022 [facility]. Her condition has improved with current weight management strategies, showing decreased insulin levels and a stable A1c at 5.5%. The plan is to continue current weight management strategies and monitor A1c and insulin levels.  Hypercholesterolemia   Her LDL is 101, and HDL is 62. Metabolic health has improved with weight loss. We will continue monitoring lipid levels.  General Health  Maintenance   Her mammogram in July was normal, and she had a colonoscopy in February. Her vitamin D level is 68.9, and she is taking vitamin D every other day. We will continue the current vitamin D supplementation and follow up with Dr. Tiburcio Pea for labs in February.  Follow-up   We will schedule a follow-up appointment in four weeks.          Objective   Physical Exam:  Blood pressure 104/69, pulse 73, temperature 98.1 F (36.7 C), height 5\' 2"  (1.575 m), weight 169 lb (76.7 kg), SpO2 97%. Body mass index is 30.91 kg/m.  General: She is overweight, cooperative, alert, well developed, and in no acute distress. PSYCH: Has normal mood, affect and thought process.   HEENT: EOMI, sclerae are anicteric. Lungs: Normal breathing effort, no conversational dyspnea. Extremities: No edema.  Neurologic: No gross sensory or motor deficits. No tremors or fasciculations noted.    Diagnostic Data Reviewed:  BMET    Component Value Date/Time   NA 141 06/09/2021 0740   K 4.8 06/09/2021 0740   CL 102 06/09/2021 0740   CO2 26 06/09/2021 0740   GLUCOSE 96 06/09/2021 0740   GLUCOSE 97 07/05/2011 1034   BUN 18 06/09/2021 0740   CREATININE 0.51 (L) 06/09/2021 0740   CALCIUM 9.5 06/09/2021 0740   GFRNONAA 107 05/13/2020 0852   GFRAA 123 05/13/2020 0852   Lab Results  Component Value Date   HGBA1C 5.5 06/09/2021   HGBA1C 5.8 (H) 05/13/2020   Lab Results  Component Value Date  INSULIN 14.6 06/09/2021   INSULIN 17.7 05/13/2020   Lab Results  Component Value Date   TSH 1.100 06/09/2021   CBC    Component Value Date/Time   WBC 4.8 06/09/2021 0740   WBC 7.9 03/25/2012 1112   WBC 14.2 (H) 06/24/2009 0512   RBC 4.71 06/09/2021 0740   RBC 4.65 03/25/2012 1112   RBC 4.36 06/24/2009 0512   HGB 14.2 06/09/2021 0740   HGB 14.0 03/25/2012 1112   HCT 42.6 06/09/2021 0740   HCT 41.7 03/25/2012 1112   PLT 237 06/09/2021 0740   MCV 90 06/09/2021 0740   MCV 89.7 03/25/2012 1112   MCH  30.1 06/09/2021 0740   MCH 30.2 03/25/2012 1112   MCHC 33.3 06/09/2021 0740   MCHC 33.6 03/25/2012 1112   MCHC 33.4 06/24/2009 0512   RDW 12.0 06/09/2021 0740   RDW 12.8 03/25/2012 1112   Iron Studies    Component Value Date/Time   IRON 72 05/13/2020 0852   TIBC 321 05/13/2020 0852   FERRITIN 203 (H) 05/13/2020 0852   IRONPCTSAT 22 05/13/2020 0852   Lipid Panel     Component Value Date/Time   CHOL 184 06/09/2021 0740   TRIG 114 06/09/2021 0740   HDL 64 06/09/2021 0740   CHOLHDL 2.9 05/13/2020 0852   CHOLHDL 4.4 CALC 07/22/2007 1008   VLDL 17 07/22/2007 1008   LDLCALC 100 (H) 06/09/2021 0740   LDLDIRECT 143.6 07/22/2007 1008   Hepatic Function Panel     Component Value Date/Time   PROT 7.2 06/09/2021 0740   ALBUMIN 4.6 06/09/2021 0740   AST 18 06/09/2021 0740   ALT 24 06/09/2021 0740   ALKPHOS 65 06/09/2021 0740   BILITOT 0.5 06/09/2021 0740   BILIDIR 0.2 07/22/2007 1008      Component Value Date/Time   TSH 1.100 06/09/2021 0740   Nutritional Lab Results  Component Value Date   VD25OH 67.3 06/09/2021   VD25OH 69.9 03/09/2021   VD25OH 77.9 10/13/2020    Follow-Up   Return in about 4 weeks (around 03/29/2023) for For Weight Mangement with Dr. Rikki Spearing.Marland Kitchen She was informed of the importance of frequent follow up visits to maximize her success with intensive lifestyle modifications for her multiple health conditions.  Attestation Statement   Reviewed by clinician on day of visit: allergies, medications, problem list, medical history, surgical history, family history, social history, and previous encounter notes.     Worthy Rancher, MD

## 2023-04-12 ENCOUNTER — Ambulatory Visit (INDEPENDENT_AMBULATORY_CARE_PROVIDER_SITE_OTHER): Payer: BC Managed Care – PPO | Admitting: Internal Medicine

## 2023-04-24 ENCOUNTER — Encounter (INDEPENDENT_AMBULATORY_CARE_PROVIDER_SITE_OTHER): Payer: Self-pay | Admitting: Internal Medicine

## 2023-04-24 ENCOUNTER — Ambulatory Visit (INDEPENDENT_AMBULATORY_CARE_PROVIDER_SITE_OTHER): Payer: BC Managed Care – PPO | Admitting: Internal Medicine

## 2023-04-24 VITALS — BP 127/85 | HR 89 | Temp 98.2°F | Ht 62.0 in | Wt 175.0 lb

## 2023-04-24 DIAGNOSIS — Z6832 Body mass index (BMI) 32.0-32.9, adult: Secondary | ICD-10-CM

## 2023-04-24 DIAGNOSIS — R7303 Prediabetes: Secondary | ICD-10-CM

## 2023-04-24 DIAGNOSIS — E66812 Obesity, class 2: Secondary | ICD-10-CM

## 2023-04-24 MED ORDER — PHENTERMINE HCL 37.5 MG PO TABS
18.7500 mg | ORAL_TABLET | Freq: Every day | ORAL | 0 refills | Status: DC
Start: 2023-04-24 — End: 2023-05-17

## 2023-04-24 NOTE — Progress Notes (Signed)
 Office: [phone]  /  Fax: [phone]  Weight Summary And Biometrics  Vitals Temp: 98.2 F (36.8 C) BP: 127/85 Pulse Rate: 89 SpO2: 97 %   Anthropometric Measurements Height: 5' 2 (1.575 m) Weight: 175 lb (79.4 kg) BMI (Calculated): 32 Weight at Last Visit: 169 lb Weight Lost Since Last Visit: 0 lb Weight Gained Since Last Visit: 6 lb Starting Weight: 197lb Total Weight Loss (lbs): 22 lb (9.979 kg) Peak Weight: 197 lb   Body Composition  Body Fat %: 42 % Fat Mass (lbs): 73.4 lbs Muscle Mass (lbs): 96.2 lbs Total Body Water (lbs): 68 lbs Visceral Fat Rating : 11    No data recorded Today's Visit #: 41  Starting Date: 05/12/20   Subjective   Chief Complaint: Obesity  Carmen Garza is here to discuss her progress with her obesity treatment plan. She is on the the Category 2 Plan and states she is following her eating plan approximately 30 % of the time. She states she is not exercising.  Interval History:   Since last office visit she has gained 6 pounds. She reports  declining adherence due to dieting fatigue She has been working on not skipping meals, increasing protein intake at every meal, drinking more water, and making healthier choices   Orexigenic Control:  Reports problems with appetite and hunger signals.  Reports problems with satiety and satiation.  Reports problems with eating patterns and portion control.  Reports abnormal cravings. Denies feeling deprived or restricted.   Barriers identified: strong hunger signals and impaired satiety / inhibitory control, difficulty maintaining a reduced calorie state, and dieting fatigue .   Pharmacotherapy for weight loss: She is currently taking no anti-obesity medication and patient has declined pharmacotherapy in past.   Assessment and Plan   Treatment Plan For Obesity:  Recommended Dietary Goals  Cherrill is currently in the action stage of change. As such, her goal is to continue weight  management plan. She has agreed to: follow a tailored, multi-day, low carbohydrate, high protein plan targeting 1100 calories and 90 grams of protein per day  Behavioral Intervention  We discussed the following Behavioral Modification Strategies today: continue to work on maintaining a reduced calorie state, getting the recommended amount of protein, incorporating whole foods, making healthy choices, staying well hydrated and practicing mindfulness when eating..  Additional resources provided today:  7-day meal plan targeting 1100 cal  Recommended Physical Activity Goals  Bryan has been advised to work up to 150 minutes of moderate intensity aerobic activity a week and strengthening exercises 2-3 times per week for cardiovascular health, weight loss maintenance and preservation of muscle mass.   She has agreed to :  Increase physical activity in their day and reduce sedentary time (increase NEAT). and continue to gradually increase the amount and intensity of exercise routine  Pharmacotherapy  We discussed various medication options to help Kersten with her weight loss efforts and we both agreed to : start anti-obesity medication.  In addition to reduced calorie nutrition plan (RCNP), behavioral strategies and physical activity, Takiera would benefit from pharmacotherapy to assist with hunger signals, satiety and cravings. This will reduce obesity-related health risks by inducing weight loss, and help reduce food consumption and adherence to [facility]) . It may also improve QOL by improving self-confidence and reduce the  setbacks associated with metabolic adaptations.  After discussion of treatment options, mechanisms of action, benefits, side effects, contraindications and shared decision making she is agreeable to starting Adipex 18.75 mg in the morning.  Patient also made aware that medication is indicated for long-term management of obesity and the risk of weight regain following discontinuation  of treatment and hence the importance of adhering to medical weight loss plan.  Patient signed agreement form.  We also reviewed state registry for controlled substances  We also discussed the possibility of adding metformin for weight management and also diabetes prevention.  Associated Conditions Addressed and Impacted by Obesity Treatment  Class 2 severe obesity with serious comorbidity and body mass index (BMI) of 36.0 to 36.9 in adult, unspecified obesity type (HCC) Assessment & Plan: Peak Weight: 197 in 2022 Starting Weight : 197 Current weight: 170  Working BMR: 1400 Weight loss goal: 10 to 15% Nutritional: 1100-calorie 7-day meal plan provided Past Pharmacotherapy: None Current Pharmacotherapy: Start Adipex 18.75 mg once daily  Patient declined repeat indirect calorimetry today to check on metabolism due to allergies. Contributing factors: Other: Adaptive thermogenesis status from chronic dieting    Orders: -     Phentermine  HCl; Take 0.5 tablets (18.75 mg total) by mouth daily before breakfast.  Dispense: 15 tablet; Refill: 0  Prediabetes Assessment & Plan: Improved.  She gets blood work at her PCPs office reports A1c has been less than 5.7.  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  Patient is agreeable and will consider starting at the next office visit.  Orders: -     Phentermine  HCl; Take 0.5 tablets (18.75 mg total) by mouth daily before breakfast.  Dispense: 15 tablet; Refill: 0     Objective   Physical Exam:  Blood pressure 127/85, pulse 89, temperature 98.2 F (36.8 C), height 5' 2 (1.575 m), weight 175 lb (79.4 kg), SpO2 97%. Body mass index is 32.01 kg/m.  General: She is overweight, cooperative, alert, well developed, and in no acute distress. PSYCH: Has normal mood, affect and thought process.   HEENT: EOMI, sclerae are anicteric. Lungs: Normal breathing  effort, no conversational dyspnea. Extremities: No edema.  Neurologic: No gross sensory or motor deficits. No tremors or fasciculations noted.    Diagnostic Data Reviewed:  BMET    Component Value Date/Time   NA 141 06/09/2021 0740   K 4.8 06/09/2021 0740   CL 102 06/09/2021 0740   CO2 26 06/09/2021 0740   GLUCOSE 96 06/09/2021 0740   GLUCOSE 97 07/05/2011 1034   BUN 18 06/09/2021 0740   CREATININE 0.51 (L) 06/09/2021 0740   CALCIUM 9.5 06/09/2021 0740   GFRNONAA 107 05/13/2020 0852   GFRAA 123 05/13/2020 0852   Lab Results  Component Value Date   HGBA1C 5.5 06/09/2021   HGBA1C 5.8 (H) 05/13/2020   Lab Results  Component Value Date   INSULIN  14.6 06/09/2021   INSULIN  17.7 05/13/2020   Lab Results  Component Value Date   TSH 1.100 06/09/2021   CBC    Component Value Date/Time   WBC 4.8 06/09/2021 0740   WBC 7.9 03/25/2012 1112   WBC 14.2 (H) 06/24/2009 0512   RBC 4.71 06/09/2021 0740   RBC 4.65 03/25/2012 1112   RBC 4.36 06/24/2009 0512   HGB 14.2 06/09/2021 0740   HGB 14.0 03/25/2012 1112   HCT 42.6 06/09/2021 0740   HCT 41.7 03/25/2012 1112   PLT 237 06/09/2021 0740   MCV 90 06/09/2021 0740   MCV 89.7 03/25/2012 1112   MCH 30.1 06/09/2021 0740   MCH 30.2 03/25/2012 1112   MCHC 33.3 06/09/2021 0740  MCHC 33.6 03/25/2012 1112   MCHC 33.4 06/24/2009 0512   RDW 12.0 06/09/2021 0740   RDW 12.8 03/25/2012 1112   Iron Studies    Component Value Date/Time   IRON 72 05/13/2020 0852   TIBC 321 05/13/2020 0852   FERRITIN 203 (H) 05/13/2020 0852   IRONPCTSAT 22 05/13/2020 0852   Lipid Panel     Component Value Date/Time   CHOL 184 06/09/2021 0740   TRIG 114 06/09/2021 0740   HDL 64 06/09/2021 0740   CHOLHDL 2.9 05/13/2020 0852   CHOLHDL 4.4 CALC 07/22/2007 1008   VLDL 17 07/22/2007 1008   LDLCALC 100 (H) 06/09/2021 0740   LDLDIRECT 143.6 07/22/2007 1008   Hepatic Function Panel     Component Value Date/Time   PROT 7.2 06/09/2021 0740   ALBUMIN  4.6 06/09/2021 0740   AST 18 06/09/2021 0740   ALT 24 06/09/2021 0740   ALKPHOS 65 06/09/2021 0740   BILITOT 0.5 06/09/2021 0740   BILIDIR 0.2 07/22/2007 1008      Component Value Date/Time   TSH 1.100 06/09/2021 0740   Nutritional Lab Results  Component Value Date   VD25OH 67.3 06/09/2021   VD25OH 69.9 03/09/2021   VD25OH 77.9 10/13/2020    Follow-Up   Return in about 4 weeks (around 05/22/2023) for For Weight Mangement with Dr. [email or web address].[email or web address] She was informed of the importance of frequent follow up visits to maximize her success with intensive lifestyle modifications for her multiple health conditions.  Attestation Statement   Reviewed by clinician on day of visit: allergies, medications, problem list, medical history, surgical history, family history, social history, and previous encounter notes.     Lucas Francyne, MD

## 2023-04-24 NOTE — Assessment & Plan Note (Signed)
 Peak Weight: 197 in 2022 Starting Weight : 197 Current weight: 170  Working BMR: 1400 Weight loss goal: 10 to 15% Nutritional: 1100-calorie 7-day meal plan provided Past Pharmacotherapy: None Current Pharmacotherapy: Start Adipex 18.75 mg once daily  Patient declined repeat indirect calorimetry today to check on metabolism due to allergies. Contributing factors: Other: Adaptive thermogenesis status from chronic dieting

## 2023-04-24 NOTE — Assessment & Plan Note (Signed)
 Improved.  She gets blood work at her PCPs office reports A1c has been less than 5.7.  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  Patient is agreeable and will consider starting at the next office visit.

## 2023-05-17 ENCOUNTER — Ambulatory Visit (INDEPENDENT_AMBULATORY_CARE_PROVIDER_SITE_OTHER): Payer: BC Managed Care – PPO | Admitting: Internal Medicine

## 2023-05-17 ENCOUNTER — Encounter (INDEPENDENT_AMBULATORY_CARE_PROVIDER_SITE_OTHER): Payer: Self-pay | Admitting: Internal Medicine

## 2023-05-17 VITALS — BP 108/71 | HR 75 | Temp 97.5°F | Ht 62.0 in | Wt 169.0 lb

## 2023-05-17 DIAGNOSIS — G479 Sleep disorder, unspecified: Secondary | ICD-10-CM | POA: Diagnosis not present

## 2023-05-17 DIAGNOSIS — R7303 Prediabetes: Secondary | ICD-10-CM | POA: Diagnosis not present

## 2023-05-17 DIAGNOSIS — Z87891 Personal history of nicotine dependence: Secondary | ICD-10-CM

## 2023-05-17 DIAGNOSIS — E66812 Obesity, class 2: Secondary | ICD-10-CM

## 2023-05-17 DIAGNOSIS — Z6836 Body mass index (BMI) 36.0-36.9, adult: Secondary | ICD-10-CM | POA: Diagnosis not present

## 2023-05-17 MED ORDER — PHENTERMINE HCL 37.5 MG PO TABS
18.7500 mg | ORAL_TABLET | Freq: Every day | ORAL | 0 refills | Status: DC
Start: 2023-05-17 — End: 2023-06-18

## 2023-05-17 NOTE — Assessment & Plan Note (Signed)
 Patient lost 6 pounds since last office visit she had experienced a plateau and was having some weight regain and of last year so this is a significant change.  She is doing a better job being more mindful about her cravings and dealing with stress.  She will continue with medically supervised weight management plan.  See obesity treatment plan

## 2023-05-17 NOTE — Assessment & Plan Note (Addendum)
 She remains abstinent.  She smoked 1/2 ppd for 10-15 years, quit in 2011.  Patient does not qualify for lung cancer screening based on her smoking history.

## 2023-05-17 NOTE — Progress Notes (Signed)
 Office: [phone]  /  Fax: [phone]  Weight Summary And Biometrics  Vitals Temp: (!) 97.5 F (36.4 C) BP: 108/71 Pulse Rate: 75 SpO2: 100 %   Anthropometric Measurements Height: 5' 2 (1.575 m) Weight: 169 lb (76.7 kg) BMI (Calculated): 30.9 Weight at Last Visit: 175 lb Weight Lost Since Last Visit: 6 lb Weight Gained Since Last Visit: 0 lb Starting Weight: 197 lb Total Weight Loss (lbs): 28 lb (12.7 kg) Peak Weight: 197 lb   Body Composition  Body Fat %: 40.1 % Fat Mass (lbs): 68 lbs Muscle Mass (lbs): 96.2 lbs Total Body Water (lbs): 66.8 lbs Visceral Fat Rating : 10    No data recorded Today's Visit #: 42  Starting Date: 05/12/20   Subjective   Chief Complaint: Obesity  Carmen Garza is here to discuss her progress with her obesity treatment plan. She is on the the Category 2 Plan and states she is following her eating plan approximately 70 % of the time. She states she is exercising 30 minutes 2 times per week.  Weight Progress Since Last Visit:  Since last office visit she has lost 6 pounds. She reports good adherence to reduced calorie nutritional plan. She has been working on reading food labels, not skipping meals, increasing protein intake at every meal, drinking more water, making healthier choices, reducing portion sizes, and incorporating more whole foods . Last office visit she was started on phentermine  for appetite control she denies any adverse effects.  She has noticed a reduction in appetite and increased sense of fullness.  She has had some difficulty with sleep onset and waking up in the middle of the night she is not sure if it is the medication or work related stress.  Challenges affecting patient progress: emotional eating.   Orexigenic Control: Reports improved problems with appetite and hunger signals.  Reports improved problems with satiety and satiation.  Denies problems with eating patterns and portion control.  Denies abnormal  cravings. Denies feeling deprived or restricted.   Pharmacotherapy for weight management: She is currently taking Phentermine  (longterm use, single agent)  with adequate clinical response  and without side effects..   Assessment and Plan   Treatment Plan For Obesity:  Recommended Dietary Goals  Carmen Garza is currently in the action stage of change. As such, her goal is to continue weight management plan. She has agreed to: continue current plan  Behavioral Health and Counseling  We discussed the following behavioral modification strategies today: continue to work on maintaining a reduced calorie state, getting the recommended amount of protein, incorporating whole foods, making healthy choices, staying well hydrated and practicing mindfulness when eating..  Additional education and resources provided today: None  Recommended Physical Activity Goals  Carmen Garza has been advised to work up to 150 minutes of moderate intensity aerobic activity a week and strengthening exercises 2-3 times per week for cardiovascular health, weight loss maintenance and preservation of muscle mass.   She has agreed to :  Think about enjoyable ways to increase daily physical activity and overcoming barriers to exercise and Increase physical activity in their day and reduce sedentary time (increase NEAT).  Pharmacotherapy  We discussed various medication options to help Carmen Garza with her weight loss efforts and we both agreed to : adequate clinical response to current dose, continue current regimen  Associated Conditions Impacted by Obesity Treatment  Former smoker Assessment & Plan: She remains abstinent.  She smoked 1/2 ppd for 10-15 years, quit in 2011.  Patient does not  qualify for lung cancer screening based on her smoking history.   Class 2 severe obesity with serious comorbidity and body mass index (BMI) of 36.0 to 36.9 in adult, unspecified obesity type [facility] Garza) Assessment & Plan: Patient lost 6 pounds  since last office visit she had experienced a plateau and was having some weight regain and of last year so this is a significant change.  She is doing a better job being more mindful about her cravings and dealing with stress.  She will continue with medically supervised weight management plan.  See obesity treatment plan  Orders: -     Phentermine  HCl; Take 0.5 tablets (18.75 mg total) by mouth daily before breakfast.  Dispense: 15 tablet; Refill: 0  Prediabetes Assessment & Plan: She has prediabetes with hyperinsulinemia.  Her most recent A1c shows an improvement.  She is due for labs and will be having these fasting at her PCPs office I had advised her to have them add insulin  levels.  We will consider pharmacoprophylaxis with metformin.  Continue to maintain a diet low of simple and added sugars.  Orders: -     Phentermine  HCl; Take 0.5 tablets (18.75 mg total) by mouth daily before breakfast.  Dispense: 15 tablet; Refill: 0  Sleep difficulties Assessment & Plan: She is now having some difficulties with sleep this could be related to phentermine  or work related stress.  She will try to take magnesium glycinate to see if that helps.  She may also hold phentermine  to see if it is medication related.  She takes phentermine  early in the morning so levels should be low in the evening so medication could cause insomnia.      Objective   Physical Exam:  Blood pressure 108/71, pulse 75, temperature (!) 97.5 F (36.4 C), height 5' 2 (1.575 m), weight 169 lb (76.7 kg), SpO2 100%. Body mass index is 30.91 kg/m.  General: She is overweight, cooperative, alert, well developed, and in no acute distress. PSYCH: Has normal mood, affect and thought process.   HEENT: EOMI, sclerae are anicteric. Lungs: Normal breathing effort, no conversational dyspnea. Extremities: No edema.  Neurologic: No gross sensory or motor deficits. No tremors or fasciculations noted.    Diagnostic Data  Reviewed:  BMET    Component Value Date/Time   NA 141 06/09/2021 0740   K 4.8 06/09/2021 0740   CL 102 06/09/2021 0740   CO2 26 06/09/2021 0740   GLUCOSE 96 06/09/2021 0740   GLUCOSE 97 07/05/2011 1034   BUN 18 06/09/2021 0740   CREATININE 0.51 (L) 06/09/2021 0740   CALCIUM 9.5 06/09/2021 0740   GFRNONAA 107 05/13/2020 0852   GFRAA 123 05/13/2020 0852   Lab Results  Component Value Date   HGBA1C 5.5 06/09/2021   HGBA1C 5.8 (H) 05/13/2020   Lab Results  Component Value Date   INSULIN  14.6 06/09/2021   INSULIN  17.7 05/13/2020   Lab Results  Component Value Date   TSH 1.100 06/09/2021   CBC    Component Value Date/Time   WBC 4.8 06/09/2021 0740   WBC 7.9 03/25/2012 1112   WBC 14.2 (H) 06/24/2009 0512   RBC 4.71 06/09/2021 0740   RBC 4.65 03/25/2012 1112   RBC 4.36 06/24/2009 0512   HGB 14.2 06/09/2021 0740   HGB 14.0 03/25/2012 1112   HCT 42.6 06/09/2021 0740   HCT 41.7 03/25/2012 1112   PLT 237 06/09/2021 0740   MCV 90 06/09/2021 0740   MCV 89.7 03/25/2012 1112   MCH  30.1 06/09/2021 0740   MCH 30.2 03/25/2012 1112   MCHC 33.3 06/09/2021 0740   MCHC 33.6 03/25/2012 1112   MCHC 33.4 06/24/2009 0512   RDW 12.0 06/09/2021 0740   RDW 12.8 03/25/2012 1112   Iron Studies    Component Value Date/Time   IRON 72 05/13/2020 0852   TIBC 321 05/13/2020 0852   FERRITIN 203 (H) 05/13/2020 0852   IRONPCTSAT 22 05/13/2020 0852   Lipid Panel     Component Value Date/Time   CHOL 184 06/09/2021 0740   TRIG 114 06/09/2021 0740   HDL 64 06/09/2021 0740   CHOLHDL 2.9 05/13/2020 0852   CHOLHDL 4.4 CALC 07/22/2007 1008   VLDL 17 07/22/2007 1008   LDLCALC 100 (H) 06/09/2021 0740   LDLDIRECT 143.6 07/22/2007 1008   Hepatic Function Panel     Component Value Date/Time   PROT 7.2 06/09/2021 0740   ALBUMIN 4.6 06/09/2021 0740   AST 18 06/09/2021 0740   ALT 24 06/09/2021 0740   ALKPHOS 65 06/09/2021 0740   BILITOT 0.5 06/09/2021 0740   BILIDIR 0.2 07/22/2007 1008       Component Value Date/Time   TSH 1.100 06/09/2021 0740   Nutritional Lab Results  Component Value Date   VD25OH 67.3 06/09/2021   VD25OH 69.9 03/09/2021   VD25OH 77.9 10/13/2020    Follow-Up   Return in about 4 weeks (around 06/14/2023) for For Weight Mangement with Dr. Francyne.[email or web address] She was informed of the importance of frequent follow up visits to maximize her success with intensive lifestyle modifications for her multiple health conditions.  Attestation Statement   Reviewed by clinician on day of visit: allergies, medications, problem list, medical history, surgical history, family history, social history, and previous encounter notes.     Lucas Francyne, MD

## 2023-05-17 NOTE — Assessment & Plan Note (Signed)
 She is now having some difficulties with sleep this could be related to phentermine  or work related stress.  She will try to take magnesium glycinate to see if that helps.  She may also hold phentermine  to see if it is medication related.  She takes phentermine  early in the morning so levels should be low in the evening so medication could cause insomnia.

## 2023-05-17 NOTE — Assessment & Plan Note (Signed)
 She has prediabetes with hyperinsulinemia.  Her most recent A1c shows an improvement.  She is due for labs and will be having these fasting at her PCPs office I had advised her to have them add insulin  levels.  We will consider pharmacoprophylaxis with metformin.  Continue to maintain a diet low of simple and added sugars.

## 2023-05-22 DIAGNOSIS — Z8543 Personal history of malignant neoplasm of ovary: Secondary | ICD-10-CM | POA: Diagnosis not present

## 2023-05-22 DIAGNOSIS — N959 Unspecified menopausal and perimenopausal disorder: Secondary | ICD-10-CM | POA: Diagnosis not present

## 2023-05-22 DIAGNOSIS — Z0000 Encounter for general adult medical examination without abnormal findings: Secondary | ICD-10-CM | POA: Diagnosis not present

## 2023-05-22 DIAGNOSIS — M816 Localized osteoporosis [Lequesne]: Secondary | ICD-10-CM | POA: Diagnosis not present

## 2023-05-22 DIAGNOSIS — E88819 Insulin resistance, unspecified: Secondary | ICD-10-CM | POA: Diagnosis not present

## 2023-05-22 DIAGNOSIS — E7800 Pure hypercholesterolemia, unspecified: Secondary | ICD-10-CM | POA: Diagnosis not present

## 2023-06-06 DIAGNOSIS — Z01419 Encounter for gynecological examination (general) (routine) without abnormal findings: Secondary | ICD-10-CM | POA: Diagnosis not present

## 2023-06-18 ENCOUNTER — Encounter (INDEPENDENT_AMBULATORY_CARE_PROVIDER_SITE_OTHER): Payer: Self-pay | Admitting: Internal Medicine

## 2023-06-18 ENCOUNTER — Ambulatory Visit (INDEPENDENT_AMBULATORY_CARE_PROVIDER_SITE_OTHER): Payer: BC Managed Care – PPO | Admitting: Internal Medicine

## 2023-06-18 VITALS — BP 110/74 | HR 81 | Temp 97.5°F | Ht 62.0 in | Wt 170.0 lb

## 2023-06-18 DIAGNOSIS — E66812 Obesity, class 2: Secondary | ICD-10-CM | POA: Diagnosis not present

## 2023-06-18 DIAGNOSIS — E559 Vitamin D deficiency, unspecified: Secondary | ICD-10-CM | POA: Diagnosis not present

## 2023-06-18 DIAGNOSIS — Z6836 Body mass index (BMI) 36.0-36.9, adult: Secondary | ICD-10-CM

## 2023-06-18 DIAGNOSIS — E88819 Insulin resistance, unspecified: Secondary | ICD-10-CM

## 2023-06-18 DIAGNOSIS — R7303 Prediabetes: Secondary | ICD-10-CM | POA: Diagnosis not present

## 2023-06-18 MED ORDER — PHENTERMINE HCL 37.5 MG PO TABS
18.7500 mg | ORAL_TABLET | Freq: Every day | ORAL | 0 refills | Status: DC
Start: 2023-06-18 — End: 2023-07-18

## 2023-06-18 NOTE — Assessment & Plan Note (Signed)
 Presents for medical weight management. Currently on phentermine 18.75 mg once daily since January. Gained one pound since the last visit. Adheres to the category two plan 50-60% of the time, tracking calories, getting recommended protein, maintaining hydration, not skipping meals, exercising two days a week for 45 minutes, and achieving 10,000 steps a day. Reports adequate sleep but higher stress levels. Blood work: normal liver enzymes, kidney function, blood counts, and thyroid hormone. LDL 94, total cholesterol 295, HDL 67, triglycerides 122, insulin 15.6 (slightly increased from 14.6), fasting glucose 89, vitamin D 80.6. Last A1c in 2023 was 5.5. Discussed metformin for insulin sensitivity, reducing cravings, and weight management. Discussed intermittent fasting (16:8) and the importance of physical activity for calorie deficit. Emphasized consistency with healthy habits. - Continue phentermine 18.75 mg once daily - Reduce vitamin D supplementation to twice a week - Order A1c test in the future - Consider metformin for insulin resistance and weight management - Discuss intermittent fasting (16:8) - Follow up in one month

## 2023-06-18 NOTE — Progress Notes (Signed)
 Office: [phone]  /  Fax: [phone]  Weight Summary And Biometrics  Vitals Temp: (!) 97.5 F (36.4 C) BP: 110/74 Pulse Rate: 81 SpO2: 99 %   Anthropometric Measurements Height: 5\' 2"  (1.575 m) Weight: 170 lb (77.1 kg) BMI (Calculated): 31.09 Weight at Last Visit: 169 lb Weight Lost Since Last Visit: 1 lb Weight Gained Since Last Visit: 0 lb Starting Weight: 197 lb Total Weight Loss (lbs): 27 lb (12.2 kg) Peak Weight: 197 lb   Body Composition  Body Fat %: 41.3 % Fat Mass (lbs): 70.4 lbs Muscle Mass (lbs): 95.2 lbs Total Body Water (lbs): 67.4 lbs Visceral Fat Rating : 10    No data recorded Today's Visit #: 45  Starting Date: 05/12/20   Subjective   Chief Complaint: Obesity  Interval History Discussed the use of AI scribe software for clinical note transcription with the patient, who gave verbal consent to proceed.  History of Present Illness   The patient presents for medical weight management.  She is currently on phentermine 18.75 mg once daily, which was started in January. Initially, she lost six pounds but has recently gained one pound, attributing this to recent dietary choices, including a birthday celebration with high-carbohydrate foods. She follows a category two dietary plan 50-60% of the time, tracks her calories, and maintains adequate protein intake and hydration. She exercises two days a week for 45 minutes, achieves 10,000 steps daily, and incorporates strengthening and cardio exercises. She reports adequate sleep but acknowledges higher stress levels.  She has a history of insulin resistance and prediabetes, with insulin levels slightly increased from previous values. Her highest A1c was 5.8 in 2022, and it was 5.5 in 2023. She is sensitive to carbohydrates and has experienced a regression in prediabetes. She has not been on metformin before.  Recent blood work from [facility] showed an LDL cholesterol of 94, total  cholesterol of 182, HDL of 67, triglycerides of 122, insulin levels of 15.6, and fasting blood glucose of 89. Her vitamin D levels were high normal at 80.6. Liver enzymes, kidney function, blood counts, and thyroid hormone levels were normal.        Challenges affecting patient progress: having difficulty focusing on healthy eating and low volume of physical activity at present .    Pharmacotherapy for weight management: She is currently taking Phentermine (longterm use, single agent)  with adequate clinical response  and without side effects..   Assessment and Plan   Treatment Plan For Obesity:  Recommended Dietary Goals  Carmen Garza is currently in the action stage of change. As such, her goal is to continue weight management plan. She has agreed to: continue current plan  Behavioral Health and Counseling  We discussed the following behavioral modification strategies today: decreasing simple carbohydrates  and continue to work on maintaining a reduced calorie state, getting the recommended amount of protein, incorporating whole foods, making healthy choices, staying well hydrated and practicing mindfulness when eating..  Additional education and resources provided today: None  Recommended Physical Activity Goals  Carmen Garza has been advised to work up to 150 minutes of moderate intensity aerobic activity a week and strengthening exercises 2-3 times per week for cardiovascular health, weight loss maintenance and preservation of muscle mass.   She has agreed to :  continue to gradually increase the amount and intensity of exercise routine  Pharmacotherapy  We discussed various medication options to help Carmen Garza with her weight loss efforts and we both agreed to : adequate clinical  response to current dose, continue current regimen  Associated Conditions Impacted by Obesity Treatment  Vitamin D deficiency Assessment & Plan: Vitamin D levels are high normal at 80.6. Previously on vitamin D  supplementation due to deficiency. Discussed risks of excessive vitamin D, including potential for kidney stones. Recommended reducing vitamin D supplementation to twice a week. - Reduce vitamin D supplementation to twice a week - Monitor vitamin D levels   Class 2 severe obesity with serious comorbidity and body mass index (BMI) of 36.0 to 36.9 in adult, unspecified obesity type [facility]) Assessment & Plan: Presents for medical weight management. Currently on phentermine 18.75 mg once daily since January. Gained one pound since the last visit. Adheres to the category two plan 50-60% of the time, tracking calories, getting recommended protein, maintaining hydration, not skipping meals, exercising two days a week for 45 minutes, and achieving 10,000 steps a day. Reports adequate sleep but higher stress levels. Blood work: normal liver enzymes, kidney function, blood counts, and thyroid hormone. LDL 94, total cholesterol 161, HDL 67, triglycerides 122, insulin 15.6 (slightly increased from 14.6), fasting glucose 89, vitamin D 80.6. Last A1c in 2023 was 5.5. Discussed metformin for insulin sensitivity, reducing cravings, and weight management. Discussed intermittent fasting (16:8) and the importance of physical activity for calorie deficit. Emphasized consistency with healthy habits. - Continue phentermine 18.75 mg once daily - Reduce vitamin D supplementation to twice a week - Order A1c test in the future - Consider metformin for insulin resistance and weight management - Discuss intermittent fasting (16:8) - Follow up in one month  Orders: -     Phentermine HCl; Take 0.5 tablets (18.75 mg total) by mouth daily before breakfast.  Dispense: 15 tablet; Refill: 0  Prediabetes Assessment & Plan: Highest A1c recorded at 5.8 in 2022. Last A1c in 2023 was 5.5.  This was not recently checked at her annual wellness.  We will consider repeating in the future.  Consider adding metformin for pharmacoprophylaxis  that she still has some insulin resistance.  Orders: -     Phentermine HCl; Take 0.5 tablets (18.75 mg total) by mouth daily before breakfast.  Dispense: 15 tablet; Refill: 0  Insulin resistance Assessment & Plan: Improved her Homa IR is 1.8.  She has lost 18% of her body weight.  Patient educated again on the carb insulin model for obesity and will continue to work on reducing simple and added sugars in her diet.  Consider metformin for pharmacoprophylaxis in the future                   Objective   Physical Exam:  Blood pressure 110/74, pulse 81, temperature (!) 97.5 F (36.4 C), height 5\' 2"  (1.575 m), weight 170 lb (77.1 kg), SpO2 99%. Body mass index is 31.09 kg/m.  General: She is overweight, cooperative, alert, well developed, and in no acute distress. PSYCH: Has normal mood, affect and thought process.   HEENT: EOMI, sclerae are anicteric. Lungs: Normal breathing effort, no conversational dyspnea. Extremities: No edema.  Neurologic: No gross sensory or motor deficits. No tremors or fasciculations noted.    Diagnostic Data Reviewed:  BMET    Component Value Date/Time   NA 141 06/09/2021 0740   K 4.8 06/09/2021 0740   CL 102 06/09/2021 0740   CO2 26 06/09/2021 0740   GLUCOSE 96 06/09/2021 0740   GLUCOSE 97 07/05/2011 1034   BUN 18 06/09/2021 0740   CREATININE 0.51 (L) 06/09/2021 0740   CALCIUM 9.5 06/09/2021  0740   GFRNONAA 107 05/13/2020 0852   GFRAA 123 05/13/2020 0852   Lab Results  Component Value Date   HGBA1C 5.5 06/09/2021   HGBA1C 5.8 (H) 05/13/2020   Lab Results  Component Value Date   INSULIN 14.6 06/09/2021   INSULIN 17.7 05/13/2020   Lab Results  Component Value Date   TSH 1.100 06/09/2021   CBC    Component Value Date/Time   WBC 4.8 06/09/2021 0740   WBC 7.9 03/25/2012 1112   WBC 14.2 (H) 06/24/2009 0512   RBC 4.71 06/09/2021 0740   RBC 4.65 03/25/2012 1112   RBC 4.36 06/24/2009 0512   HGB 14.2 06/09/2021 0740   HGB 14.0  03/25/2012 1112   HCT 42.6 06/09/2021 0740   HCT 41.7 03/25/2012 1112   PLT 237 06/09/2021 0740   MCV 90 06/09/2021 0740   MCV 89.7 03/25/2012 1112   MCH 30.1 06/09/2021 0740   MCH 30.2 03/25/2012 1112   MCHC 33.3 06/09/2021 0740   MCHC 33.6 03/25/2012 1112   MCHC 33.4 06/24/2009 0512   RDW 12.0 06/09/2021 0740   RDW 12.8 03/25/2012 1112   Iron Studies    Component Value Date/Time   IRON 72 05/13/2020 0852   TIBC 321 05/13/2020 0852   FERRITIN 203 (H) 05/13/2020 0852   IRONPCTSAT 22 05/13/2020 0852   Lipid Panel     Component Value Date/Time   CHOL 184 06/09/2021 0740   TRIG 114 06/09/2021 0740   HDL 64 06/09/2021 0740   CHOLHDL 2.9 05/13/2020 0852   CHOLHDL 4.4 CALC 07/22/2007 1008   VLDL 17 07/22/2007 1008   LDLCALC 100 (H) 06/09/2021 0740   LDLDIRECT 143.6 07/22/2007 1008   Hepatic Function Panel     Component Value Date/Time   PROT 7.2 06/09/2021 0740   ALBUMIN 4.6 06/09/2021 0740   AST 18 06/09/2021 0740   ALT 24 06/09/2021 0740   ALKPHOS 65 06/09/2021 0740   BILITOT 0.5 06/09/2021 0740   BILIDIR 0.2 07/22/2007 1008      Component Value Date/Time   TSH 1.100 06/09/2021 0740   Nutritional Lab Results  Component Value Date   VD25OH 67.3 06/09/2021   VD25OH 69.9 03/09/2021   VD25OH 77.9 10/13/2020    Medications: Outpatient Encounter Medications as of 06/18/2023  Medication Sig   Calcium Carbonate-Vitamin D (CALCIUM-VITAMIN D3) 600-125 MG-UNIT TABS Take by mouth.   ibuprofen (ADVIL) 200 MG tablet Take 200 mg by mouth every 6 (six) hours as needed.   loratadine (CLARITIN) 10 MG tablet Take 10 mg by mouth daily.   Multiple Vitamin (MULTIVITAMIN) tablet Take 1 tablet by mouth daily.   pseudoephedrine (SUDAFED) 30 MG tablet Take 30 mg by mouth every 4 (four) hours as needed.   venlafaxine XR (EFFEXOR-XR) 37.5 MG 24 hr capsule Take 1 capsule (37.5 mg total) by mouth daily.   [DISCONTINUED] phentermine (ADIPEX-P) 37.5 MG tablet Take 0.5 tablets (18.75 mg  total) by mouth daily before breakfast.   phentermine (ADIPEX-P) 37.5 MG tablet Take 0.5 tablets (18.75 mg total) by mouth daily before breakfast.   [DISCONTINUED] UNABLE TO FIND Take 1 capsule by mouth once a week.   No facility-administered encounter medications on file as of 06/18/2023.     Follow-Up   Return in about 4 weeks (around 07/16/2023) for For Weight Mangement with Dr. Rikki Spearing.Marland Kitchen She was informed of the importance of frequent follow up visits to maximize her success with intensive lifestyle modifications for her multiple health conditions.  Attestation Statement  Reviewed by clinician on day of visit: allergies, medications, problem list, medical history, surgical history, family history, social history, and previous encounter notes.     Worthy Rancher, MD

## 2023-06-18 NOTE — Assessment & Plan Note (Signed)
 Vitamin D levels are high normal at 80.6. Previously on vitamin D supplementation due to deficiency. Discussed risks of excessive vitamin D, including potential for kidney stones. Recommended reducing vitamin D supplementation to twice a week. - Reduce vitamin D supplementation to twice a week - Monitor vitamin D levels

## 2023-06-18 NOTE — Assessment & Plan Note (Signed)
 Improved her Homa IR is 1.8.  She has lost 18% of her body weight.  Patient educated again on the carb insulin model for obesity and will continue to work on reducing simple and added sugars in her diet.  Consider metformin for pharmacoprophylaxis in the future

## 2023-06-18 NOTE — Assessment & Plan Note (Signed)
 Highest A1c recorded at 5.8 in 2022. Last A1c in 2023 was 5.5.  This was not recently checked at her annual wellness.  We will consider repeating in the future.  Consider adding metformin for pharmacoprophylaxis that she still has some insulin resistance.

## 2023-06-21 DIAGNOSIS — J0110 Acute frontal sinusitis, unspecified: Secondary | ICD-10-CM | POA: Diagnosis not present

## 2023-07-17 ENCOUNTER — Encounter (INDEPENDENT_AMBULATORY_CARE_PROVIDER_SITE_OTHER): Payer: Self-pay | Admitting: Internal Medicine

## 2023-07-17 ENCOUNTER — Ambulatory Visit (INDEPENDENT_AMBULATORY_CARE_PROVIDER_SITE_OTHER): Admitting: Internal Medicine

## 2023-07-17 VITALS — BP 102/69 | HR 84 | Temp 97.7°F | Ht 62.0 in | Wt 168.0 lb

## 2023-07-17 DIAGNOSIS — E66812 Obesity, class 2: Secondary | ICD-10-CM

## 2023-07-17 DIAGNOSIS — Z6836 Body mass index (BMI) 36.0-36.9, adult: Secondary | ICD-10-CM | POA: Diagnosis not present

## 2023-07-17 DIAGNOSIS — E88819 Insulin resistance, unspecified: Secondary | ICD-10-CM

## 2023-07-17 DIAGNOSIS — R7303 Prediabetes: Secondary | ICD-10-CM

## 2023-07-17 NOTE — Progress Notes (Signed)
 Office: [phone]  /  Fax: [phone]  Weight Summary And Biometrics  Vitals Temp: 97.7 F (36.5 C) BP: 102/69 Pulse Rate: 84 SpO2: 98 %   Anthropometric Measurements Height: 5\' 2"  (1.575 m) Weight: 168 lb (76.2 kg) BMI (Calculated): 30.72 Weight at Last Visit: 17 lb Weight Lost Since Last Visit: 2 lb Weight Gained Since Last Visit: 0 lb Starting Weight: 197 lb Total Weight Loss (lbs): 29 lb (13.2 kg) Peak Weight: 197 lb   Body Composition  Body Fat %: 40.2 % Fat Mass (lbs): 67.8 lbs Muscle Mass (lbs): 95.6 lbs Total Body Water (lbs): 66.4 lbs Visceral Fat Rating : 10    No data recorded Today's Visit #: 44  Starting Date: 05/12/20   Subjective   Chief Complaint: Obesity  Carmen Garza is here to discuss her progress with her obesity treatment plan. She is on the the Category 2 Plan and states she is following her eating plan approximately 60-70% of the time. She states she is exercising 40 minutes 2 times per week.  Weight Progress Since Last Visit:  Since last office visit she has lost 2 pounds. She reports fair adherence to reduced calorie nutritional plan. She has been working on reading food labels, not skipping meals, drinking more water, making healthier choices, reducing portion sizes, incorporating more whole foods, and note inadequate protein intake .  Last visit we discussed intermittent fasting and consideration for metformin because of history of insulin resistance. Ocassionally eat out boredom  Challenges affecting patient progress:  inadequate .   Orexigenic Control: Reports improved problems with appetite and hunger signals.  Reports improved problems with satiety and satiation.  Denies problems with eating patterns and portion control.  Reports abnormal cravings. Denies feeling deprived or restricted.   Pharmacotherapy for weight management: She is currently taking Phentermine (longterm use, single agent)  with adequate clinical response   and without side effects..   Assessment and Plan   Treatment Plan For Obesity:  Recommended Dietary Goals  [name] is currently in the action stage of change. As such, her goal is to continue weight management plan. She has agreed to: follow the Category 2 plan - 1200 kcal per day + intermittent 16:8  Behavioral Health and Counseling  We discussed the following behavioral modification strategies today: increasing lean protein intake to established goals, avoiding skipping meals, and avoiding temptations and identifying enticing environmental cues.  Additional education and resources provided today: None  Recommended Physical Activity Goals  [name] has been advised to work up to 150 minutes of moderate intensity aerobic activity a week and strengthening exercises 2-3 times per week for cardiovascular health, weight loss maintenance and preservation of muscle mass.   She has agreed to :  Work on scheduling and tracking physical activity.   Pharmacotherapy  We discussed various medication options to help Domino with her weight loss efforts and we both agreed to : increase phentermine to 37.5 mg once a day  Associated Conditions Impacted by Obesity Treatment  Insulin resistance Assessment & Plan: Improved her Homa IR is 1.8.  She has lost 18% of her body weight.  Check hemoglobin A1c and insulin levels at the next office visit  Orders: -     Hemoglobin A1c -     Insulin, random  Class 2 severe obesity with serious comorbidity and body mass index (BMI) of 36.0 to 36.9 in adult, unspecified obesity type [facility]) Assessment & Plan: Thus far she has lost 17% of total body weight on current  weight management strategy inclusive of phentermine.  We are increasing phentermine to 37.5 mg once a day.  She will also continue to work on increasing her protein intake to 90 to 120 g/week.  She we will also continue to work on increasing volume of physical activity.  See obesity treatment  plan Heart rate and blood pressure well-controlled.   Prediabetes Assessment & Plan: Highest A1c recorded at 5.8 in 2022. Last A1c in 2023 was 5.5.  We will be checking A1c and fasting insulin levels at the next visit.  Labs have been entered we will hold off metformin for pharmacoprophylaxis until then.  Continue to maintain a diet with a low glycemic load.  She is working on increasing physical activity and has lost 17% of total body weight.  Orders: -     Hemoglobin A1c     Objective   Physical Exam:  Blood pressure 102/69, pulse 84, temperature 97.7 F (36.5 C), height 5\' 2"  (1.575 m), weight 168 lb (76.2 kg), SpO2 98%. Body mass index is 30.73 kg/m.  General: She is overweight, cooperative, alert, well developed, and in no acute distress. PSYCH: Has normal mood, affect and thought process.   HEENT: EOMI, sclerae are anicteric. Lungs: Normal breathing effort, no conversational dyspnea. Extremities: No edema.  Neurologic: No gross sensory or motor deficits. No tremors or fasciculations noted.    Diagnostic Data Reviewed:  BMET    Component Value Date/Time   NA 141 06/09/2021 0740   K 4.8 06/09/2021 0740   CL 102 06/09/2021 0740   CO2 26 06/09/2021 0740   GLUCOSE 96 06/09/2021 0740   GLUCOSE 97 07/05/2011 1034   BUN 18 06/09/2021 0740   CREATININE 0.51 (L) 06/09/2021 0740   CALCIUM 9.5 06/09/2021 0740   GFRNONAA 107 05/13/2020 0852   GFRAA 123 05/13/2020 0852   Lab Results  Component Value Date   HGBA1C 5.5 06/09/2021   HGBA1C 5.8 (H) 05/13/2020   Lab Results  Component Value Date   INSULIN 14.6 06/09/2021   INSULIN 17.7 05/13/2020   Lab Results  Component Value Date   TSH 1.100 06/09/2021   CBC    Component Value Date/Time   WBC 4.8 06/09/2021 0740   WBC 7.9 03/25/2012 1112   WBC 14.2 (H) 06/24/2009 0512   RBC 4.71 06/09/2021 0740   RBC 4.65 03/25/2012 1112   RBC 4.36 06/24/2009 0512   HGB 14.2 06/09/2021 0740   HGB 14.0 03/25/2012 1112   HCT  42.6 06/09/2021 0740   HCT 41.7 03/25/2012 1112   PLT 237 06/09/2021 0740   MCV 90 06/09/2021 0740   MCV 89.7 03/25/2012 1112   MCH 30.1 06/09/2021 0740   MCH 30.2 03/25/2012 1112   MCHC 33.3 06/09/2021 0740   MCHC 33.6 03/25/2012 1112   MCHC 33.4 06/24/2009 0512   RDW 12.0 06/09/2021 0740   RDW 12.8 03/25/2012 1112   Iron Studies    Component Value Date/Time   IRON 72 05/13/2020 0852   TIBC 321 05/13/2020 0852   FERRITIN 203 (H) 05/13/2020 0852   IRONPCTSAT 22 05/13/2020 0852   Lipid Panel     Component Value Date/Time   CHOL 184 06/09/2021 0740   TRIG 114 06/09/2021 0740   HDL 64 06/09/2021 0740   CHOLHDL 2.9 05/13/2020 0852   CHOLHDL 4.4 CALC 07/22/2007 1008   VLDL 17 07/22/2007 1008   LDLCALC 100 (H) 06/09/2021 0740   LDLDIRECT 143.6 07/22/2007 1008   Hepatic Function Panel  Component Value Date/Time   PROT 7.2 06/09/2021 0740   ALBUMIN 4.6 06/09/2021 0740   AST 18 06/09/2021 0740   ALT 24 06/09/2021 0740   ALKPHOS 65 06/09/2021 0740   BILITOT 0.5 06/09/2021 0740   BILIDIR 0.2 07/22/2007 1008      Component Value Date/Time   TSH 1.100 06/09/2021 0740   Nutritional Lab Results  Component Value Date   VD25OH 67.3 06/09/2021   VD25OH 69.9 03/09/2021   VD25OH 77.9 10/13/2020    Medications: Outpatient Encounter Medications as of 07/17/2023  Medication Sig   Calcium Carbonate-Vitamin D (CALCIUM-VITAMIN D3) 600-125 MG-UNIT TABS Take by mouth.   ibuprofen (ADVIL) 200 MG tablet Take 200 mg by mouth every 6 (six) hours as needed.   loratadine (CLARITIN) 10 MG tablet Take 10 mg by mouth daily.   Multiple Vitamin (MULTIVITAMIN) tablet Take 1 tablet by mouth daily.   phentermine (ADIPEX-P) 37.5 MG tablet Take 0.5 tablets (18.75 mg total) by mouth daily before breakfast.   pseudoephedrine (SUDAFED) 30 MG tablet Take 30 mg by mouth every 4 (four) hours as needed.   venlafaxine XR (EFFEXOR-XR) 37.5 MG 24 hr capsule Take 1 capsule (37.5 mg total) by mouth daily.    No facility-administered encounter medications on file as of 07/17/2023.     Follow-Up   Return in about 4 weeks (around 08/14/2023) for For Weight Mangement with Dr. Rikki Spearing, Fasting for labs.. She was informed of the importance of frequent follow up visits to maximize her success with intensive lifestyle modifications for her multiple health conditions.  Attestation Statement   Reviewed by clinician on day of visit: allergies, medications, problem list, medical history, surgical history, family history, social history, and previous encounter notes.     Worthy Rancher, MD

## 2023-07-17 NOTE — Assessment & Plan Note (Addendum)
 Improved her Homa IR is 1.8.  She has lost 18% of her body weight.  Check hemoglobin A1c and insulin levels at the next office visit

## 2023-07-17 NOTE — Assessment & Plan Note (Addendum)
 Highest A1c recorded at 5.8 in 2022. Last A1c in 2023 was 5.5.  We will be checking A1c and fasting insulin levels at the next visit.  Labs have been entered we will hold off metformin for pharmacoprophylaxis until then.  Continue to maintain a diet with a low glycemic load.  She is working on increasing physical activity and has lost 17% of total body weight.

## 2023-07-17 NOTE — Assessment & Plan Note (Addendum)
 Thus far she has lost 17% of total body weight on current weight management strategy inclusive of phentermine.  We are increasing phentermine to 37.5 mg once a week.  She will also continue to work on increasing her protein intake to 90 to 120 g/week.  She we will also continue to work on increasing volume of physical activity.  See obesity treatment plan Heart rate and blood pressure well-controlled.

## 2023-07-18 MED ORDER — PHENTERMINE HCL 37.5 MG PO TABS
37.5000 mg | ORAL_TABLET | Freq: Every day | ORAL | 0 refills | Status: DC
Start: 2023-07-18 — End: 2023-08-20

## 2023-07-18 NOTE — Addendum Note (Signed)
 Addended by: Lyda Kalata on: 07/18/2023 12:31 PM   Modules accepted: Orders

## 2023-08-02 DIAGNOSIS — H5213 Myopia, bilateral: Secondary | ICD-10-CM | POA: Diagnosis not present

## 2023-08-20 ENCOUNTER — Encounter (INDEPENDENT_AMBULATORY_CARE_PROVIDER_SITE_OTHER): Payer: Self-pay | Admitting: Family Medicine

## 2023-08-20 ENCOUNTER — Ambulatory Visit (INDEPENDENT_AMBULATORY_CARE_PROVIDER_SITE_OTHER): Admitting: Family Medicine

## 2023-08-20 VITALS — BP 111/68 | HR 71 | Temp 97.7°F | Ht 62.0 in | Wt 166.0 lb

## 2023-08-20 DIAGNOSIS — E66812 Obesity, class 2: Secondary | ICD-10-CM

## 2023-08-20 DIAGNOSIS — Z6830 Body mass index (BMI) 30.0-30.9, adult: Secondary | ICD-10-CM | POA: Diagnosis not present

## 2023-08-20 DIAGNOSIS — R7303 Prediabetes: Secondary | ICD-10-CM | POA: Diagnosis not present

## 2023-08-20 DIAGNOSIS — E88819 Insulin resistance, unspecified: Secondary | ICD-10-CM

## 2023-08-20 MED ORDER — PHENTERMINE HCL 37.5 MG PO TABS
37.5000 mg | ORAL_TABLET | Freq: Every day | ORAL | 0 refills | Status: DC
Start: 2023-08-20 — End: 2023-09-11

## 2023-08-20 NOTE — Progress Notes (Signed)
 SUBJECTIVE:  Chief Complaint: Obesity  Interim History: Patient has been following category 2 around 75% of the time.  Yesterday she spent time with her grandkids and had a busy weekend.  Over the next few weeks she doesn't have much planned- has started back at the gym.  [employer] opens up at 5am and she is there doing cardio and weights.    Carmen Garza is here to discuss her progress with her obesity treatment plan. She is on the Category 2 Plan and states she is following her eating plan approximately 75 % of the time. She states she is exercising 60 minutes 4-5 times per week.   OBJECTIVE: Visit Diagnoses: Problem List Items Addressed This Visit       Endocrine   Insulin  resistance - Primary   Patient has been working on limiting simple carbohydrates.  Labs already ordered.  Patient is fasting.  Will discuss results with Dr. Allie Area at next appointment.        Other   Prediabetes   Patient on phentermine  to help with intake control.  Needs a refill today- PDMP checked with no concerns.  No reported side effects of medication.      Relevant Medications   phentermine  (ADIPEX-P ) 37.5 MG tablet   Class 2 severe obesity with serious comorbidity and body mass index (BMI) of 36.0 to 36.9 in adult [facility])   Anthropometric Measurements Height: 5\' 2"  (1.575 m) Weight: 166 lb (75.3 kg) BMI (Calculated): 30.35 Weight at Last Visit: 168 lb Weight Lost Since Last Visit: 2 Weight Gained Since Last Visit: 0 Starting Weight: 197 lb Total Weight Loss (lbs): 31 lb (14.1 kg) Peak Weight: 197 lb Body Composition  Body Fat %: 40 % Fat Mass (lbs): 66.6 lbs Muscle Mass (lbs): 94.8 lbs Total Body Water (lbs): 67.8 lbs Visceral Fat Rating : 10 Other Clinical Data Fasting: yes Labs: yes Today's Visit #: 45 Starting Date: 05/12/20 Comments: Cat [id number]       Relevant Medications   phentermine  (ADIPEX-P ) 37.5 MG tablet   Other Visit Diagnoses       BMI 30.0-30.9,adult            No data recorded       08/20/2023   10:00 AM 07/17/2023    7:00 AM 06/18/2023    7:00 AM  Vitals with BMI  Height 5\' 2"  5\' 2"  5\' 2"   Weight 166 lbs 168 lbs 170 lbs  BMI 30.35 30.72 31.09  Systolic 111 102 161  Diastolic 68 69 74  Pulse 71 84 81      ASSESSMENT AND PLAN:  Diet: Miryam is currently in the action stage of change. As such, her goal is to continue with weight loss efforts and has agreed to the Category 2 Plan.  Patient is wanting to really recommit to meal plan the next month given life starting to settle down.  Exercise:  For substantial health benefits, adults should do at least 150 minutes (2 hours and 30 minutes) a week of moderate-intensity, or 75 minutes (1 hour and 15 minutes) a week of vigorous-intensity aerobic physical activity, or an equivalent combination of moderate- and vigorous-intensity aerobic activity. Aerobic activity should be performed in episodes of at least 10 minutes, and preferably, it should be spread throughout the week.  Behavior Modification:  We discussed the following Behavioral Modification Strategies today: increasing lean protein intake, decreasing simple carbohydrates, increasing vegetables, meal planning and cooking strategies, and keeping healthy foods in the home. We discussed  various medication options to help Vickee with her weight loss efforts and we both agreed to refill phentermine  37.5mg  with no change in dose.  Return in about 3 weeks (around 09/10/2023) for with Dr. Allie Area.   She was informed of the importance of frequent follow up visits to maximize her success with intensive lifestyle modifications for her multiple health conditions.  Attestation Statements:   Reviewed by clinician on day of visit: allergies, medications, problem list, medical history, surgical history, family history, social history, and previous encounter notes.     Donaciano Frizzle, MD

## 2023-08-21 LAB — INSULIN, RANDOM: INSULIN: 6.8 u[IU]/mL (ref 2.6–24.9)

## 2023-08-21 LAB — HEMOGLOBIN A1C
Est. average glucose Bld gHb Est-mCnc: 114 mg/dL
Hgb A1c MFr Bld: 5.6 % (ref 4.8–5.6)

## 2023-08-23 DIAGNOSIS — L821 Other seborrheic keratosis: Secondary | ICD-10-CM | POA: Diagnosis not present

## 2023-08-23 DIAGNOSIS — D229 Melanocytic nevi, unspecified: Secondary | ICD-10-CM | POA: Diagnosis not present

## 2023-08-23 DIAGNOSIS — L578 Other skin changes due to chronic exposure to nonionizing radiation: Secondary | ICD-10-CM | POA: Diagnosis not present

## 2023-08-23 DIAGNOSIS — L814 Other melanin hyperpigmentation: Secondary | ICD-10-CM | POA: Diagnosis not present

## 2023-08-27 NOTE — Assessment & Plan Note (Signed)
 Anthropometric Measurements Height: 5\' 2"  (1.575 m) Weight: 166 lb (75.3 kg) BMI (Calculated): 30.35 Weight at Last Visit: 168 lb Weight Lost Since Last Visit: 2 Weight Gained Since Last Visit: 0 Starting Weight: 197 lb Total Weight Loss (lbs): 31 lb (14.1 kg) Peak Weight: 197 lb Body Composition  Body Fat %: 40 % Fat Mass (lbs): 66.6 lbs Muscle Mass (lbs): 94.8 lbs Total Body Water (lbs): 67.8 lbs Visceral Fat Rating : 10 Other Clinical Data Fasting: yes Labs: yes Today's Visit #: 45 Starting Date: 05/12/20 Comments: Cat [id number]

## 2023-08-27 NOTE — Assessment & Plan Note (Signed)
 Patient on phentermine  to help with intake control.  Needs a refill today- PDMP checked with no concerns.  No reported side effects of medication.

## 2023-08-27 NOTE — Assessment & Plan Note (Signed)
 Patient has been working on limiting simple carbohydrates.  Labs already ordered.  Patient is fasting.  Will discuss results with Dr. Allie Area at next appointment.

## 2023-08-29 ENCOUNTER — Ambulatory Visit (INDEPENDENT_AMBULATORY_CARE_PROVIDER_SITE_OTHER): Payer: Self-pay | Admitting: Internal Medicine

## 2023-09-11 ENCOUNTER — Ambulatory Visit (INDEPENDENT_AMBULATORY_CARE_PROVIDER_SITE_OTHER): Admitting: Internal Medicine

## 2023-09-11 ENCOUNTER — Encounter (INDEPENDENT_AMBULATORY_CARE_PROVIDER_SITE_OTHER): Payer: Self-pay | Admitting: Internal Medicine

## 2023-09-11 VITALS — BP 102/66 | HR 92 | Temp 98.0°F | Ht 62.0 in | Wt 164.0 lb

## 2023-09-11 DIAGNOSIS — E66812 Obesity, class 2: Secondary | ICD-10-CM

## 2023-09-11 DIAGNOSIS — E7849 Other hyperlipidemia: Secondary | ICD-10-CM | POA: Diagnosis not present

## 2023-09-11 DIAGNOSIS — R7303 Prediabetes: Secondary | ICD-10-CM

## 2023-09-11 DIAGNOSIS — Z6836 Body mass index (BMI) 36.0-36.9, adult: Secondary | ICD-10-CM

## 2023-09-11 DIAGNOSIS — E88819 Insulin resistance, unspecified: Secondary | ICD-10-CM | POA: Diagnosis not present

## 2023-09-11 MED ORDER — PHENTERMINE HCL 37.5 MG PO TABS
37.5000 mg | ORAL_TABLET | Freq: Every day | ORAL | 0 refills | Status: DC
Start: 2023-09-11 — End: 2023-10-16

## 2023-09-11 NOTE — Progress Notes (Signed)
 Office: [phone]  /  Fax: [phone]  Weight Summary And Biometrics  Vitals Temp: 98 F (36.7 C) BP: 102/66 Pulse Rate: 92 SpO2: 96 %   Anthropometric Measurements Height: 5\' 2"  (1.575 m) Weight: 164 lb (74.4 kg) BMI (Calculated): 29.99 Weight at Last Visit: 166 lb Weight Lost Since Last Visit: 2 lb Weight Gained Since Last Visit: 0 lb Starting Weight: 197 lb Total Weight Loss (lbs): 33 lb (15 kg) Peak Weight: 197 lb   Body Composition  Body Fat %: 39.1 % Fat Mass (lbs): 64.4 lbs Muscle Mass (lbs): 95.2 lbs Total Body Water (lbs): 67.4 lbs Visceral Fat Rating : 10    RMR: 1339  Today's Visit #: 46  Starting Date: 05/12/20   Subjective   Chief Complaint: Obesity  Interval History Discussed the use of AI scribe software for clinical note transcription with the patient, who gave verbal consent to proceed.  History of Present Illness   Carmen Garza is a 58 year old female who presents for medical weight management.  She follows a 1200 calorie meal plan approximately 70-80% of the time, focusing on whole foods and maintaining adequate hydration. She exercises five days a week for about 60 minutes, including strength and cardio workouts. She has lost a total of 33 pounds, which is 17% of her total body weight, since starting her weight management program. Her weight loss journey has seen fluctuations, with a plateau and some weight regain in 2023, but since January, she has resumed a downward trend in weight.  She experiences inadequate sleep, often going to bed around 10 or 11 PM, which makes it challenging to wake up early for her 5 AM gym sessions at [employer]. Despite this, she maintains her routine, stating 'Monday through Friday, just like work, that's my routine.'  No high levels of stress and she no longer smokes. Her last LDL cholesterol was 94, total cholesterol was 182, and fasting blood sugar was 89, as of February. Her vitamin D  levels  were slightly elevated, and she has adjusted her intake to twice a week.  She has been on phentermine  since January, which she feels has helped with her appetite and energy levels, making it easier to maintain her exercise routine. No palpitations and she feels the medication has been beneficial.       Challenges affecting patient progress: none.    Pharmacotherapy for weight management: She is currently taking Phentermine  (longterm use, single agent)  with adequate clinical response  and without side effects..   Assessment and Plan   Treatment Plan For Obesity:  Recommended Dietary Goals  [name] is currently in the action stage of change. As such, her goal is to continue weight management plan. She has agreed to: continue current plan  Behavioral Health and Counseling  We discussed the following behavioral modification strategies today: continue to work on maintaining a reduced calorie state, getting the recommended amount of protein, incorporating whole foods, making healthy choices, staying well hydrated and practicing mindfulness when eating..  Additional education and resources provided today: None  Recommended Physical Activity Goals  [name] has been advised to work up to 150 minutes of moderate intensity aerobic activity a week and strengthening exercises 2-3 times per week for cardiovascular health, weight loss maintenance and preservation of muscle mass.   She has agreed to :  Think about enjoyable ways to increase daily physical activity and overcoming barriers to exercise and Increase physical activity in their day and reduce sedentary time (  increase NEAT).  Pharmacotherapy  We discussed various medication options to help [name] with her weight loss efforts and we both agreed to : Adequate clinical response to anti-obesity medication, continue current regimen  Associated Conditions Impacted by Obesity Treatment  Insulin  resistance  Class 2 severe obesity with  serious comorbidity and body mass index (BMI) of 36.0 to 36.9 in adult, unspecified obesity type (HCC) -     Phentermine  HCl; Take 1 tablet (37.5 mg total) by mouth daily before breakfast.  Dispense: 30 tablet; Refill: 0  Prediabetes -     Phentermine  HCl; Take 1 tablet (37.5 mg total) by mouth daily before breakfast.  Dispense: 30 tablet; Refill: 0  Other hyperlipidemia     Assessment and Plan    Obesity She presents for medical weight management, having successfully lost 33 pounds (17% of her total body weight), reducing her BMI to 29. She adheres to a 1200 calorie meal plan 70-80% of the time, exercises five days a week, and has increased muscle mass. Improved energy levels may be attributed to phentermine , initiated in January. She denies high stress but reports inadequate sleep. The goal is to reduce body fat percentage to 34%. Phentermine  has been effective in aiding weight loss and increasing energy, facilitating her exercise routine. She prefers to avoid GLP-1 receptor agonists due to potential weight regain. - Continue phentermine  at the current dose and send prescription to [facility] - Encourage hydration, reduction of processed foods, and adequate protein intake at each meal - Advise not to skip meals - Schedule follow-up in one month  Prediabetes She has prediabetes with a fasting blood sugar of 89 in February, indicating good control. Active weight and diet management are crucial for controlling prediabetes.  Insulin  Resistance She has insulin  resistance with insulin  levels of 15.6 in February. She manages her condition through weight loss and lifestyle modifications.  Hyperlipidemia She has hyperlipidemia with an LDL cholesterol of 94 and total cholesterol of 182 in February, indicating good control. She manages her condition through diet and exercise.  General Health Maintenance She is a former smoker and has been advised to reduce vitamin D  intake due to elevated levels.  She maintains a healthy lifestyle through diet and exercise. - Continue vitamin D  supplementation twice a week - Encourage continued smoking cessation  Follow-up She is scheduled for a follow-up appointment on July 8. She is advised to fast, avoid coffee, and hold phentermine  on the morning of her next visit for accurate testing. - Schedule follow-up appointment on July 8 - Advise fasting, no coffee, and hold phentermine  on the morning of the next visit        Objective   Physical Exam:  Blood pressure 102/66, pulse 92, temperature 98 F (36.7 C), height 5\' 2"  (1.575 m), weight 164 lb (74.4 kg), SpO2 96%. Body mass index is 30 kg/m.  General: She is overweight, cooperative, alert, well developed, and in no acute distress. PSYCH: Has normal mood, affect and thought process.   HEENT: EOMI, sclerae are anicteric. Lungs: Normal breathing effort, no conversational dyspnea. Extremities: No edema.  Neurologic: No gross sensory or motor deficits. No tremors or fasciculations noted.    Diagnostic Data Reviewed:  BMET    Component Value Date/Time   NA 141 06/09/2021 0740   K 4.8 06/09/2021 0740   CL 102 06/09/2021 0740   CO2 26 06/09/2021 0740   GLUCOSE 96 06/09/2021 0740   GLUCOSE 97 07/05/2011 1034   BUN 18 06/09/2021 0740   CREATININE  0.51 (L) 06/09/2021 0740   CALCIUM 9.5 06/09/2021 0740   GFRNONAA 107 05/13/2020 0852   GFRAA 123 05/13/2020 0852   Lab Results  Component Value Date   HGBA1C 5.6 08/20/2023   HGBA1C 5.8 (H) 05/13/2020   Lab Results  Component Value Date   INSULIN  6.8 08/20/2023   INSULIN  17.7 05/13/2020   Lab Results  Component Value Date   TSH 1.100 06/09/2021   CBC    Component Value Date/Time   WBC 4.8 06/09/2021 0740   WBC 7.9 03/25/2012 1112   WBC 14.2 (H) 06/24/2009 0512   RBC 4.71 06/09/2021 0740   RBC 4.65 03/25/2012 1112   RBC 4.36 06/24/2009 0512   HGB 14.2 06/09/2021 0740   HGB 14.0 03/25/2012 1112   HCT 42.6 06/09/2021 0740    HCT 41.7 03/25/2012 1112   PLT 237 06/09/2021 0740   MCV 90 06/09/2021 0740   MCV 89.7 03/25/2012 1112   MCH 30.1 06/09/2021 0740   MCH 30.2 03/25/2012 1112   MCHC 33.3 06/09/2021 0740   MCHC 33.6 03/25/2012 1112   MCHC 33.4 06/24/2009 0512   RDW 12.0 06/09/2021 0740   RDW 12.8 03/25/2012 1112   Iron Studies    Component Value Date/Time   IRON 72 05/13/2020 0852   TIBC 321 05/13/2020 0852   FERRITIN 203 (H) 05/13/2020 0852   IRONPCTSAT 22 05/13/2020 0852   Lipid Panel     Component Value Date/Time   CHOL 184 06/09/2021 0740   TRIG 114 06/09/2021 0740   HDL 64 06/09/2021 0740   CHOLHDL 2.9 05/13/2020 0852   CHOLHDL 4.4 CALC 07/22/2007 1008   VLDL 17 07/22/2007 1008   LDLCALC 100 (H) 06/09/2021 0740   LDLDIRECT 143.6 07/22/2007 1008   Hepatic Function Panel     Component Value Date/Time   PROT 7.2 06/09/2021 0740   ALBUMIN 4.6 06/09/2021 0740   AST 18 06/09/2021 0740   ALT 24 06/09/2021 0740   ALKPHOS 65 06/09/2021 0740   BILITOT 0.5 06/09/2021 0740   BILIDIR 0.2 07/22/2007 1008      Component Value Date/Time   TSH 1.100 06/09/2021 0740   Nutritional Lab Results  Component Value Date   VD25OH 67.3 06/09/2021   VD25OH 69.9 03/09/2021   VD25OH 77.9 10/13/2020    Medications: Outpatient Encounter Medications as of 09/11/2023  Medication Sig   Calcium Carbonate-Vitamin D  (CALCIUM-VITAMIN D3) 600-125 MG-UNIT TABS Take by mouth.   ibuprofen (ADVIL) 200 MG tablet Take 200 mg by mouth every 6 (six) hours as needed.   loratadine (CLARITIN) 10 MG tablet Take 10 mg by mouth daily.   Multiple Vitamin (MULTIVITAMIN) tablet Take 1 tablet by mouth daily.   pseudoephedrine (SUDAFED) 30 MG tablet Take 30 mg by mouth every 4 (four) hours as needed.   venlafaxine  XR (EFFEXOR -XR) 37.5 MG 24 hr capsule Take 1 capsule (37.5 mg total) by mouth daily.   [DISCONTINUED] phentermine  (ADIPEX-P ) 37.5 MG tablet Take 1 tablet (37.5 mg total) by mouth daily before breakfast.    phentermine  (ADIPEX-P ) 37.5 MG tablet Take 1 tablet (37.5 mg total) by mouth daily before breakfast.   No facility-administered encounter medications on file as of 09/11/2023.     Follow-Up   No follow-ups on file.Aaron Aas She was informed of the importance of frequent follow up visits to maximize her success with intensive lifestyle modifications for her multiple health conditions.  Attestation Statement   Reviewed by clinician on day of visit: allergies, medications, problem list, medical history, surgical  history, family history, social history, and previous encounter notes.     Ladd Picker, MD

## 2023-09-24 ENCOUNTER — Telehealth (INDEPENDENT_AMBULATORY_CARE_PROVIDER_SITE_OTHER): Payer: Self-pay

## 2023-09-24 NOTE — Telephone Encounter (Signed)
 Pharmacist (oge) at [facility] [phone]),  called and stated they are unable to refill any Phentermine  due to the excess time pt has been taking Phentermine  without the combination of Topamax  ( or taking qsymia ). She will need a note as to why pt is continuing Phentermine  by itself for over 3 months.

## 2023-10-02 NOTE — Telephone Encounter (Signed)
 Letter faxed copy in chart

## 2023-10-16 ENCOUNTER — Ambulatory Visit (INDEPENDENT_AMBULATORY_CARE_PROVIDER_SITE_OTHER): Admitting: Internal Medicine

## 2023-10-16 ENCOUNTER — Encounter (INDEPENDENT_AMBULATORY_CARE_PROVIDER_SITE_OTHER): Payer: Self-pay | Admitting: Internal Medicine

## 2023-10-16 VITALS — BP 110/70 | HR 85 | Temp 98.1°F | Ht 62.0 in | Wt 164.0 lb

## 2023-10-16 DIAGNOSIS — E66812 Obesity, class 2: Secondary | ICD-10-CM | POA: Diagnosis not present

## 2023-10-16 DIAGNOSIS — E88819 Insulin resistance, unspecified: Secondary | ICD-10-CM

## 2023-10-16 DIAGNOSIS — R7303 Prediabetes: Secondary | ICD-10-CM

## 2023-10-16 DIAGNOSIS — Z6836 Body mass index (BMI) 36.0-36.9, adult: Secondary | ICD-10-CM

## 2023-10-16 MED ORDER — PHENTERMINE HCL 37.5 MG PO TABS
37.5000 mg | ORAL_TABLET | Freq: Every day | ORAL | 0 refills | Status: DC
Start: 2023-11-02 — End: 2023-12-12

## 2023-10-16 NOTE — Assessment & Plan Note (Signed)
 Highest A1c recorded at 5.8 in 2022. Last A1c in 2023 was 5.5.  We will be checking A1c and fasting insulin levels at the next visit.  Labs have been entered we will hold off metformin for pharmacoprophylaxis until then.  Continue to maintain a diet with a low glycemic load.  She is working on increasing physical activity and has lost 17% of total body weight.

## 2023-10-16 NOTE — Progress Notes (Signed)
 Office: [phone]  /  Fax: [phone]  Weight Summary and Body Composition Analysis (BIA)  Vitals Temp: 98.1 F (36.7 C) BP: 110/70 Pulse Rate: 85 SpO2: 98 %   Anthropometric Measurements Height: 5' 2 (1.575 m) Weight: 164 lb (74.4 kg) BMI (Calculated): 29.99 Weight at Last Visit: 164 lb Weight Lost Since Last Visit: 0 lb Weight Gained Since Last Visit: 0 lb Starting Weight: 197 lb Total Weight Loss (lbs): 33 lb (15 kg) Peak Weight: 197 lb   Body Composition  Body Fat %: 38.9 % Fat Mass (lbs): 63.8 lbs Muscle Mass (lbs): 95.2 lbs Total Body Water (lbs): 67.6 lbs Visceral Fat Rating : 10    RMR: 1267  Today's Visit #: 47  Starting Date: 05/12/20   Subjective   Chief Complaint: Obesity  Interval History Discussed the use of AI scribe software for clinical note transcription with the patient, who gave verbal consent to proceed.  History of Present Illness   Carmen Garza is a 58 year old female who presents for medical weight management.  She has maintained her weight since the last office visit. She adheres to a twelve hundred calorie nutrition target about fifty to sixty percent of the time, tracking her calories, consuming more whole foods, ensuring adequate protein intake, and maintaining hydration. No skipped meals are reported.  She exercises five days a week for about sixty minutes, incorporating both strength and cardio workouts. Despite this regimen, she has experienced a slowing in weight loss. Indirect calorimetry performed today showed a resting energy expenditure of one thousand two hundred and sixty seven kilocalories, slightly lower than the calculated one thousand three hundred and seventy eight kilocalories.  She is currently using phentermine  for appetite suppression and reports no side effects. There was a delay in obtaining the medication due to pharmacy regulations, but she was eventually able to receive it. She mentions that the  pharmacist indicated a misunderstanding regarding the duration of phentermine  use.  Her body composition has improved, with a reduction in body fat percentage and fat mass. She notes that her clothes fit differently, indicating changes in body composition despite weight maintenance.  Socially, she mentions having Gus's children, aged almost two and four, for the weekend, indicating involvement in family activities.       Challenges affecting patient progress: none.    Pharmacotherapy for weight management: She is currently taking Phentermine  (longterm use, single agent)  with adequate clinical response  and without side effects..   Assessment and Plan   Treatment Plan For Obesity:  Recommended Dietary Goals  Carmen Garza is currently in the action stage of change. As such, her goal is to continue weight management plan. She has agreed to: Based on resting energy expenditure today of 1267 we will switch to a 1000-calorie target with 90 to 120 g of protein.  She was provided our category 1 meal plan as well as a 7-day AI generated meal plan  Behavioral Health and Counseling  We discussed the following behavioral modification strategies today: continue to work on maintaining a reduced calorie state, getting the recommended amount of protein, incorporating whole foods, making healthy choices, staying well hydrated and practicing mindfulness when eating..  Additional education and resources provided today: None  Recommended Physical Activity Goals  Carmen Garza has been advised to work up to 150 minutes of moderate intensity aerobic activity a week and strengthening exercises 2-3 times per week for cardiovascular health, weight loss maintenance and preservation of muscle mass.   She has  agreed to :  Continue current level of physical activity   Medical Interventions and Pharmacotherapy  We discussed various medication options to help Ikia with her weight loss efforts and we both agreed to :  Adequate clinical response to anti-obesity medication, continue current regimen  Associated Conditions Impacted by Obesity Treatment  Assessment & Plan Class 2 severe obesity with serious comorbidity and body mass index (BMI) of 36.0 to 36.9 in adult, unspecified obesity type (HCC) Thus far she has lost 17% of total body weight on current weight management strategy inclusive of phentermine .  Experiencing a slowing of weight loss likely due to metabolic adaptations. Indirect calorimetry showed a resting energy expenditure of 1267 kcal, slightly lower than the calculated 1378 kcal, indicating a slight metabolic slowdown. Currently maintaining weight with a 1200 calorie diet, exercising five days a week, and has improved body composition with a reduction in body fat percentage to 38%. The goal is to reduce caloric intake to 1000 kcal per day to create a caloric deficit for further weight loss while maintaining muscle mass. Discussed the importance of not reducing calories below 1000 to prevent muscle loss. Options include a 1000 calorie meal plan or a 7-day AI-generated meal plan high in protein, incorporating meal replacements. - Fat mass down from 90s to 60s which will result in slowing of fat loss - Provide a 7-day, 1000 calorie high-protein meal plan including one meal replacement shake per day. - Continue current exercise regimen of 300 minutes per week. - Continue phentermine  for appetite suppression. Prediabetes Highest A1c recorded at 5.8 in 2022. Last A1c in 2023 was 5.5.  We will be checking A1c and fasting insulin  levels at the next visit.  Labs have been entered we will hold off metformin for pharmacoprophylaxis until then.  Continue to maintain a diet with a low glycemic load.  She is working on increasing physical activity and has lost 17% of total body weight. Insulin  resistance Improved her Homa IR is 1.8.  She has lost 18% of her body weight.  Check hemoglobin A1c and insulin  levels at  the next office visit    Phentermine  Use Using phentermine  off-label for long-term weight management. No side effects reported, and blood pressure and heart rate are well controlled. Experienced a delay in obtaining the medication due to pharmacy regulations, but this has been resolved. Discussed that phentermine  is safe for use up to two years based on studies. - Continue phentermine  as prescribed. - Ensure phentermine  prescription is aligned with appointment schedule. - Send phentermine  prescription to [facility] pharmacy with a due date of July 25th.  General Health Maintenance Engaging in regular physical activity and maintaining a balanced diet. Body composition is improving, and making progress in weight management goals. - Encourage continued physical activity and adherence to dietary plan.  Follow-up Scheduled for a follow-up appointment to monitor progress and adjust the treatment plan as necessary. - Schedule follow-up appointment in one month.        Objective   Physical Exam:  Blood pressure 110/70, pulse 85, temperature 98.1 F (36.7 C), height 5' 2 (1.575 m), weight 164 lb (74.4 kg), SpO2 98%. Body mass index is 30 kg/m.  General: She is overweight, cooperative, alert, well developed, and in no acute distress. PSYCH: Has normal mood, affect and thought process.   HEENT: EOMI, sclerae are anicteric. Lungs: Normal breathing effort, no conversational dyspnea. Extremities: No edema.  Neurologic: No gross sensory or motor deficits. No tremors or fasciculations noted.  Diagnostic Data Reviewed:  BMET    Component Value Date/Time   NA 141 06/09/2021 0740   K 4.8 06/09/2021 0740   CL 102 06/09/2021 0740   CO2 26 06/09/2021 0740   GLUCOSE 96 06/09/2021 0740   GLUCOSE 97 07/05/2011 1034   BUN 18 06/09/2021 0740   CREATININE 0.51 (L) 06/09/2021 0740   CALCIUM 9.5 06/09/2021 0740   GFRNONAA 107 05/13/2020 0852   GFRAA 123 05/13/2020 0852   Lab Results   Component Value Date   HGBA1C 5.6 08/20/2023   HGBA1C 5.8 (H) 05/13/2020   Lab Results  Component Value Date   INSULIN  6.8 08/20/2023   INSULIN  17.7 05/13/2020   Lab Results  Component Value Date   TSH 1.100 06/09/2021   CBC    Component Value Date/Time   WBC 4.8 06/09/2021 0740   WBC 7.9 03/25/2012 1112   WBC 14.2 (H) 06/24/2009 0512   RBC 4.71 06/09/2021 0740   RBC 4.65 03/25/2012 1112   RBC 4.36 06/24/2009 0512   HGB 14.2 06/09/2021 0740   HGB 14.0 03/25/2012 1112   HCT 42.6 06/09/2021 0740   HCT 41.7 03/25/2012 1112   PLT 237 06/09/2021 0740   MCV 90 06/09/2021 0740   MCV 89.7 03/25/2012 1112   MCH 30.1 06/09/2021 0740   MCH 30.2 03/25/2012 1112   MCHC 33.3 06/09/2021 0740   MCHC 33.6 03/25/2012 1112   MCHC 33.4 06/24/2009 0512   RDW 12.0 06/09/2021 0740   RDW 12.8 03/25/2012 1112   Iron Studies    Component Value Date/Time   IRON 72 05/13/2020 0852   TIBC 321 05/13/2020 0852   FERRITIN 203 (H) 05/13/2020 0852   IRONPCTSAT 22 05/13/2020 0852   Lipid Panel     Component Value Date/Time   CHOL 184 06/09/2021 0740   TRIG 114 06/09/2021 0740   HDL 64 06/09/2021 0740   CHOLHDL 2.9 05/13/2020 0852   CHOLHDL 4.4 CALC 07/22/2007 1008   VLDL 17 07/22/2007 1008   LDLCALC 100 (H) 06/09/2021 0740   LDLDIRECT 143.6 07/22/2007 1008   Hepatic Function Panel     Component Value Date/Time   PROT 7.2 06/09/2021 0740   ALBUMIN 4.6 06/09/2021 0740   AST 18 06/09/2021 0740   ALT 24 06/09/2021 0740   ALKPHOS 65 06/09/2021 0740   BILITOT 0.5 06/09/2021 0740   BILIDIR 0.2 07/22/2007 1008      Component Value Date/Time   TSH 1.100 06/09/2021 0740   Nutritional Lab Results  Component Value Date   VD25OH 67.3 06/09/2021   VD25OH 69.9 03/09/2021   VD25OH 77.9 10/13/2020    Medications: Outpatient Encounter Medications as of 10/16/2023  Medication Sig   Calcium Carbonate-Vitamin D  (CALCIUM-VITAMIN D3) 600-125 MG-UNIT TABS Take by mouth.   ibuprofen (ADVIL)  200 MG tablet Take 200 mg by mouth every 6 (six) hours as needed.   loratadine (CLARITIN) 10 MG tablet Take 10 mg by mouth daily.   Multiple Vitamin (MULTIVITAMIN) tablet Take 1 tablet by mouth daily.   pseudoephedrine (SUDAFED) 30 MG tablet Take 30 mg by mouth every 4 (four) hours as needed.   venlafaxine  XR (EFFEXOR -XR) 37.5 MG 24 hr capsule Take 1 capsule (37.5 mg total) by mouth daily.   [DISCONTINUED] phentermine  (ADIPEX-P ) 37.5 MG tablet Take 1 tablet (37.5 mg total) by mouth daily before breakfast.   [START ON 11/02/2023] phentermine  (ADIPEX-P ) 37.5 MG tablet Take 1 tablet (37.5 mg total) by mouth daily before breakfast.   No facility-administered encounter medications  on file as of 10/16/2023.     Follow-Up   Return in about 4 weeks (around 11/13/2023) for For Weight Mangement with Dr. Francyne.SABRA She was informed of the importance of frequent follow up visits to maximize her success with intensive lifestyle modifications for her multiple health conditions.  Attestation Statement   Reviewed by clinician on day of visit: allergies, medications, problem list, medical history, surgical history, family history, social history, and previous encounter notes.     Lucas Francyne, MD

## 2023-10-16 NOTE — Assessment & Plan Note (Signed)
 Improved her Homa IR is 1.8.  She has lost 18% of her body weight.  Check hemoglobin A1c and insulin levels at the next office visit

## 2023-10-16 NOTE — Assessment & Plan Note (Signed)
 Thus far she has lost 17% of total body weight on current weight management strategy inclusive of phentermine .  Experiencing a slowing of weight loss likely due to metabolic adaptations. Indirect calorimetry showed a resting energy expenditure of 1267 kcal, slightly lower than the calculated 1378 kcal, indicating a slight metabolic slowdown. Currently maintaining weight with a 1200 calorie diet, exercising five days a week, and has improved body composition with a reduction in body fat percentage to 38%. The goal is to reduce caloric intake to 1000 kcal per day to create a caloric deficit for further weight loss while maintaining muscle mass. Discussed the importance of not reducing calories below 1000 to prevent muscle loss. Options include a 1000 calorie meal plan or a 7-day AI-generated meal plan high in protein, incorporating meal replacements. - Fat mass down from 90s to 60s which will result in slowing of fat loss - Provide a 7-day, 1000 calorie high-protein meal plan including one meal replacement shake per day. - Continue current exercise regimen of 300 minutes per week. - Continue phentermine  for appetite suppression.

## 2023-10-26 ENCOUNTER — Other Ambulatory Visit: Payer: Self-pay | Admitting: Family Medicine

## 2023-10-26 DIAGNOSIS — Z0000 Encounter for general adult medical examination without abnormal findings: Secondary | ICD-10-CM

## 2023-11-02 ENCOUNTER — Ambulatory Visit
Admission: RE | Admit: 2023-11-02 | Discharge: 2023-11-02 | Disposition: A | Discharge status: Home / Self Care | Source: Ambulatory Visit | Attending: Family Medicine | Admitting: Family Medicine

## 2023-11-02 DIAGNOSIS — Z0000 Encounter for general adult medical examination without abnormal findings: Secondary | ICD-10-CM

## 2023-11-02 DIAGNOSIS — Z1231 Encounter for screening mammogram for malignant neoplasm of breast: Secondary | ICD-10-CM | POA: Diagnosis not present

## 2023-11-13 ENCOUNTER — Ambulatory Visit (INDEPENDENT_AMBULATORY_CARE_PROVIDER_SITE_OTHER): Admitting: Internal Medicine

## 2023-11-13 ENCOUNTER — Encounter (INDEPENDENT_AMBULATORY_CARE_PROVIDER_SITE_OTHER): Payer: Self-pay | Admitting: Internal Medicine

## 2023-11-13 VITALS — BP 122/84 | HR 96 | Ht 62.0 in | Wt 161.0 lb

## 2023-11-13 DIAGNOSIS — E88819 Insulin resistance, unspecified: Secondary | ICD-10-CM

## 2023-11-13 DIAGNOSIS — Z6829 Body mass index (BMI) 29.0-29.9, adult: Secondary | ICD-10-CM | POA: Diagnosis not present

## 2023-11-13 DIAGNOSIS — E669 Obesity, unspecified: Secondary | ICD-10-CM

## 2023-11-13 DIAGNOSIS — Z6828 Body mass index (BMI) 28.0-28.9, adult: Secondary | ICD-10-CM | POA: Insufficient documentation

## 2023-11-13 NOTE — Assessment & Plan Note (Signed)
 Obesity with insulin  resistance, prediabetes, and hyperlipidemia Obesity with associated insulin  resistance, prediabetes, and hyperlipidemia. Significant weight loss of 36 pounds  representing 19% of total body weight. Current BMI is 29, down from 36 in 2022. Phentermine  37.5 mg has been effective in aiding weight loss. She is engaging in nutritional, behavioral, and pharmacotherapy interventions. Bioimpedance shows body fat percentage at 38.3% with a goal of less than 34%, and visceral fat rating of 9 with a goal of less than 10. Cardiovascular risk factors are improving with weight loss. Insulin  resistance and prediabetes have improved, reducing cancer risk. Lifestyle changes alone typically result in 5-10% weight loss, while phentermine  alone results in 7-8% weight loss, highlighting her success with combined interventions. - Continue phentermine  37.5 mg for weight management.  Heart rate and blood pressure are well-controlled - Encourage tracking and journaling food intake 3-4 days a week. - Maintain physical activity 5 days a week for 60 minutes, adjusting routine every 4-6 weeks to avoid plateau. - Incorporate protein shakes and bars as meal replacements when needed. - Target a calorie intake of 1000 calories per day, with a basal metabolic rate of 8629. - Monitor heart rate during exercise, aiming for moderate intensity (82-104 bpm) and not exceeding 163 bpm. - Follow up in one month.

## 2023-11-13 NOTE — Progress Notes (Signed)
 Office: [phone]  /  Fax: [phone]  Weight Summary and Body Composition Analysis (BIA)  Vitals BP: 122/84 Pulse Rate: 96 SpO2: 98 %   Anthropometric Measurements Height: 5' 2 (1.575 m) Weight: 161 lb (73 kg) BMI (Calculated): 29.44 Weight at Last Visit: 164 lb Weight Lost Since Last Visit: 3 lb Weight Gained Since Last Visit: 0 lb Starting Weight: 197 lb Total Weight Loss (lbs): 36 lb (16.3 kg) Peak Weight: 197 lb   Body Composition  Body Fat %: 38.3 % Fat Mass (lbs): 61.8 lbs Muscle Mass (lbs): 94.8 lbs Total Body Water (lbs): 66.8 lbs Visceral Fat Rating : 9    RMR: 1267  Today's Visit #: 48  Starting Date: 05/12/20   Subjective   Chief Complaint: Obesity  Interval History Discussed the use of AI scribe software for clinical note transcription with the patient, who gave verbal consent to proceed.  History of Present Illness   Carmen Garza is a 58 year old female with ovarian cancer who presents for medical weight management.  She has been on phentermine  37.5 mg since January 2025, combined with nutritional and behavioral therapy, resulting in a 36-pound weight loss, which is 19% of her total body weight. Her body fat percentage is now 38.3% with a goal of less than 34%, and her visceral fat rating is 9 with a goal of less than 10.  She acknowledges the need for more discipline in logging her food intake, as some days she forgets to track her meals, leading to evening cravings. She finds accountability helpful when logging her food intake. She enjoys small indulgences like ice cream but tries to keep portions controlled.  Her current BMI is 29, down from 36 in 2022. She engages in physical activity five days a week for 60 minutes and is working on increasing her jogging intervals. She would like to reach a weight of 150 pounds and reports having previously been at 115 pounds two years ago.  She uses protein shakes to manage sweet cravings at work,  where there is a 'candy drawer'. Her basal metabolic rate is 8629. She feels she generally gets enough protein, though sometimes she needs more.  She has a history of ovarian cancer and keeps up with regular screenings, including mammograms and colonoscopies, due to her mother's history of colon cancer. She smoked for about ten years but has since quit. She reports no palpitations during exercise.      Challenges affecting patient progress: exposure to enticing environments and/or relationships.    Pharmacotherapy for weight management: She is currently taking Phentermine  (longterm use, single agent)  with adequate clinical response  and without side effects..   Assessment and Plan   Treatment Plan For Obesity:  Recommended Dietary Goals  Carmen Garza is currently in the action stage of change. As such, her goal is to continue weight management plan. She has agreed to: continue current plan  Behavioral Health and Counseling  We discussed the following behavioral modification strategies today: keeping healthy foods at home, practice mindfulness eating and understand the difference between hunger signals and cravings, avoiding temptations and identifying enticing environmental cues, and continue to work on maintaining a reduced calorie state, getting the recommended amount of protein, incorporating whole foods, making healthy choices, staying well hydrated and practicing mindfulness when eating..  Additional education and resources provided today: None  Recommended Physical Activity Goals  Carmen Garza has been advised to work up to 150 minutes of moderate intensity aerobic activity a week and  strengthening exercises 2-3 times per week for cardiovascular health, weight loss maintenance and preservation of muscle mass.   She has agreed to :  discussed changing exercise routine to avoid adaptation plateau or training plateau  Medical Interventions and Pharmacotherapy  We discussed various  medication options to help Carmen Garza with her weight loss efforts and we both agreed to : Adequate clinical response to anti-obesity medication, continue current regimen  Associated Conditions Impacted by Obesity Treatment  Assessment & Plan Insulin  resistance Improved her Homa IR is 1.8.  She has lost 19% of her body weight.  Continue with current weight management strategy Generalized obesity - starting BMI 36 in 2022 Obesity with insulin  resistance, prediabetes, and hyperlipidemia Obesity with associated insulin  resistance, prediabetes, and hyperlipidemia. Significant weight loss of 36 pounds  representing 19% of total body weight. Current BMI is 29, down from 36 in 2022. Phentermine  37.5 mg has been effective in aiding weight loss. She is engaging in nutritional, behavioral, and pharmacotherapy interventions. Bioimpedance shows body fat percentage at 38.3% with a goal of less than 34%, and visceral fat rating of 9 with a goal of less than 10. Cardiovascular risk factors are improving with weight loss. Insulin  resistance and prediabetes have improved, reducing cancer risk. Lifestyle changes alone typically result in 5-10% weight loss, while phentermine  alone results in 7-8% weight loss, highlighting her success with combined interventions. - Continue phentermine  37.5 mg for weight management.  Heart rate and blood pressure are well-controlled - Encourage tracking and journaling food intake 3-4 days a week. - Maintain physical activity 5 days a week for 60 minutes, adjusting routine every 4-6 weeks to avoid plateau. - Incorporate protein shakes and bars as meal replacements when needed. - Target a calorie intake of 1000 calories per day, with a basal metabolic rate of 8629. - Monitor heart rate during exercise, aiming for moderate intensity (82-104 bpm) and not exceeding 163 bpm. - Follow up in one month. BMI 29.0-29.9,adult     Objective   Physical Exam:  Blood pressure 122/84, pulse 96,  height 5' 2 (1.575 m), weight 161 lb (73 kg), SpO2 98%. Body mass index is 29.45 kg/m.  General: She is overweight, cooperative, alert, well developed, and in no acute distress. PSYCH: Has normal mood, affect and thought process.   HEENT: EOMI, sclerae are anicteric. Lungs: Normal breathing effort, no conversational dyspnea. Extremities: No edema.  Neurologic: No gross sensory or motor deficits. No tremors or fasciculations noted.    Diagnostic Data Reviewed:  BMET    Component Value Date/Time   NA 141 06/09/2021 0740   K 4.8 06/09/2021 0740   CL 102 06/09/2021 0740   CO2 26 06/09/2021 0740   GLUCOSE 96 06/09/2021 0740   GLUCOSE 97 07/05/2011 1034   BUN 18 06/09/2021 0740   CREATININE 0.51 (L) 06/09/2021 0740   CALCIUM 9.5 06/09/2021 0740   GFRNONAA 107 05/13/2020 0852   GFRAA 123 05/13/2020 0852   Lab Results  Component Value Date   HGBA1C 5.6 08/20/2023   HGBA1C 5.8 (H) 05/13/2020   Lab Results  Component Value Date   INSULIN  6.8 08/20/2023   INSULIN  17.7 05/13/2020   Lab Results  Component Value Date   TSH 1.100 06/09/2021   CBC    Component Value Date/Time   WBC 4.8 06/09/2021 0740   WBC 7.9 03/25/2012 1112   WBC 14.2 (H) 06/24/2009 0512   RBC 4.71 06/09/2021 0740   RBC 4.65 03/25/2012 1112   RBC 4.36 06/24/2009 0512  HGB 14.2 06/09/2021 0740   HGB 14.0 03/25/2012 1112   HCT 42.6 06/09/2021 0740   HCT 41.7 03/25/2012 1112   PLT 237 06/09/2021 0740   MCV 90 06/09/2021 0740   MCV 89.7 03/25/2012 1112   MCH 30.1 06/09/2021 0740   MCH 30.2 03/25/2012 1112   MCHC 33.3 06/09/2021 0740   MCHC 33.6 03/25/2012 1112   MCHC 33.4 06/24/2009 0512   RDW 12.0 06/09/2021 0740   RDW 12.8 03/25/2012 1112   Iron Studies    Component Value Date/Time   IRON 72 05/13/2020 0852   TIBC 321 05/13/2020 0852   FERRITIN 203 (H) 05/13/2020 0852   IRONPCTSAT 22 05/13/2020 0852   Lipid Panel     Component Value Date/Time   CHOL 184 06/09/2021 0740   TRIG 114  06/09/2021 0740   HDL 64 06/09/2021 0740   CHOLHDL 2.9 05/13/2020 0852   CHOLHDL 4.4 CALC 07/22/2007 1008   VLDL 17 07/22/2007 1008   LDLCALC 100 (H) 06/09/2021 0740   LDLDIRECT 143.6 07/22/2007 1008   Hepatic Function Panel     Component Value Date/Time   PROT 7.2 06/09/2021 0740   ALBUMIN 4.6 06/09/2021 0740   AST 18 06/09/2021 0740   ALT 24 06/09/2021 0740   ALKPHOS 65 06/09/2021 0740   BILITOT 0.5 06/09/2021 0740   BILIDIR 0.2 07/22/2007 1008      Component Value Date/Time   TSH 1.100 06/09/2021 0740   Nutritional Lab Results  Component Value Date   VD25OH 67.3 06/09/2021   VD25OH 69.9 03/09/2021   VD25OH 77.9 10/13/2020    Medications: Outpatient Encounter Medications as of 11/13/2023  Medication Sig   Calcium Carbonate-Vitamin D  (CALCIUM-VITAMIN D3) 600-125 MG-UNIT TABS Take by mouth.   ibuprofen (ADVIL) 200 MG tablet Take 200 mg by mouth every 6 (six) hours as needed.   loratadine (CLARITIN) 10 MG tablet Take 10 mg by mouth daily.   Multiple Vitamin (MULTIVITAMIN) tablet Take 1 tablet by mouth daily.   phentermine  (ADIPEX-P ) 37.5 MG tablet Take 1 tablet (37.5 mg total) by mouth daily before breakfast.   pseudoephedrine (SUDAFED) 30 MG tablet Take 30 mg by mouth every 4 (four) hours as needed.   venlafaxine  XR (EFFEXOR -XR) 37.5 MG 24 hr capsule Take 1 capsule (37.5 mg total) by mouth daily.   No facility-administered encounter medications on file as of 11/13/2023.     Follow-Up   Return in about 4 weeks (around 12/11/2023) for For Weight Mangement with Dr. Francyne.SABRA She was informed of the importance of frequent follow up visits to maximize her success with intensive lifestyle modifications for her multiple health conditions.  Attestation Statement   Reviewed by clinician on day of visit: allergies, medications, problem list, medical history, surgical history, family history, social history, and previous encounter notes.     Lucas Francyne, MD

## 2023-11-13 NOTE — Assessment & Plan Note (Signed)
 Improved her Homa IR is 1.8.  She has lost 19% of her body weight.  Continue with current weight management strategy

## 2023-12-07 ENCOUNTER — Other Ambulatory Visit (INDEPENDENT_AMBULATORY_CARE_PROVIDER_SITE_OTHER): Payer: Self-pay | Admitting: Internal Medicine

## 2023-12-07 DIAGNOSIS — R7303 Prediabetes: Secondary | ICD-10-CM

## 2023-12-12 ENCOUNTER — Ambulatory Visit (INDEPENDENT_AMBULATORY_CARE_PROVIDER_SITE_OTHER): Admitting: Internal Medicine

## 2023-12-12 ENCOUNTER — Encounter (INDEPENDENT_AMBULATORY_CARE_PROVIDER_SITE_OTHER): Payer: Self-pay | Admitting: Internal Medicine

## 2023-12-12 VITALS — BP 114/76 | HR 97 | Temp 97.6°F | Ht 62.0 in | Wt 159.0 lb

## 2023-12-12 DIAGNOSIS — E88819 Insulin resistance, unspecified: Secondary | ICD-10-CM

## 2023-12-12 DIAGNOSIS — E669 Obesity, unspecified: Secondary | ICD-10-CM | POA: Diagnosis not present

## 2023-12-12 DIAGNOSIS — R7303 Prediabetes: Secondary | ICD-10-CM

## 2023-12-12 DIAGNOSIS — Z6829 Body mass index (BMI) 29.0-29.9, adult: Secondary | ICD-10-CM

## 2023-12-12 MED ORDER — PHENTERMINE HCL 37.5 MG PO TABS: 37.5000 mg | ORAL_TABLET | Freq: Every day | ORAL | 1 refills | Status: DC

## 2023-12-12 NOTE — Progress Notes (Signed)
 Office: [phone]  /  Fax: [phone]  Weight Summary and Body Composition Analysis (BIA)  Vitals Temp: 97.6 F (36.4 C) BP: 114/76 Pulse Rate: 97 SpO2: 97 %   Anthropometric Measurements Height: 5' 2 (1.575 m) Weight: 159 lb (72.1 kg) BMI (Calculated): 29.07 Weight at Last Visit: 161 lb Weight Lost Since Last Visit: 2 lb Weight Gained Since Last Visit: 0 lb Starting Weight: 197 lb Total Weight Loss (lbs): 38 lb (17.2 kg) Peak Weight: 197 lb   Body Composition  Body Fat %: 38.2 % Fat Mass (lbs): 61 lbs Muscle Mass (lbs): 93.8 lbs Total Body Water (lbs): 65.8 lbs Visceral Fat Rating : 9    RMR: 1267  Today's Visit #: 49  Starting Date: 05/12/20   Subjective   Chief Complaint: Obesity  Interval History Discussed the use of AI scribe software for clinical note transcription with the patient, who gave verbal consent to proceed.  History of Present Illness SHYANN HEFNER is a 58 year old female with insulin  resistance and prediabetes who presents for medical weight management.  She has been adhering to a 1000 calorie nutrition plan, focusing on whole foods, adequate protein intake, and hydration, without skipping meals. She exercises five days a week for 65 minutes, incorporating both cardio and strength training. Since her last visit, she has lost two pounds, contributing to a total weight loss of 38 pounds since starting phentermine  in January.  She has been taking phentermine  37.5 mg since January, which has aided her weight loss along with nutrition and behavioral strategies. She experiences occasional cravings, particularly at night, which she attributes to boredom. No palpitations or increased appetite at night.  Her past medical history includes insulin  resistance and prediabetes. She has achieved a 21% total body weight loss. Her A1c is 5.5.  She occasionally takes Sudafed for ragweed allergies, noting that she used it once or twice over the past  weekend.  Her family history is significant for osteoporosis, which is prevalent on her mother's side, affecting both men and women. Her mother recently discovered she has severe osteoporosis requiring treatment.     Challenges affecting patient progress: exposure to enticing environments and/or relationships and menopause.    Pharmacotherapy for weight management: She is currently taking Phentermine  (longterm use, off-label, single agent)  with adequate clinical response  and without side effects..   Assessment and Plan   Treatment Plan For Obesity:  Recommended Dietary Goals  Danylle is currently in the action stage of change. As such, her goal is to continue weight management plan. She has agreed to: continue current plan  Behavioral Health and Counseling  We discussed the following behavioral modification strategies today: continue to work on maintaining a reduced calorie state, getting the recommended amount of protein, incorporating whole foods, making healthy choices, staying well hydrated and practicing mindfulness when eating. and increase protein intake, fibrous foods (25 grams per day for women, 30 grams for men) and water to improve satiety and decrease hunger signals. .  Additional education and resources provided today: None  Recommended Physical Activity Goals  Alene has been advised to work up to 150 minutes of moderate intensity aerobic activity a week and strengthening exercises 2-3 times per week for cardiovascular health, weight loss maintenance and preservation of muscle mass.  She has agreed to :  Increase physical activity in their day and reduce sedentary time (increase NEAT). and Combine aerobic and strengthening exercises for efficiency and improved cardiometabolic health.  Medical Interventions and Pharmacotherapy  We discussed various medication options to help Goldye with her weight loss efforts and we both agreed to : Adequate clinical response to  anti-obesity medication, continue current regimen  Associated Conditions Impacted by Obesity Treatment  Assessment & Plan Insulin  resistance Improved her Homa IR is 1.8.  She has lost 21% of her body weight.  Continue with current weight management strategy Prediabetes Improved she last 21% of TBWL.  Generalized obesity - starting BMI 36 in 2022 Generalized obesity with insulin  resistance and prediabetes Generalized obesity with insulin  resistance and prediabetes. Significant weight loss of 38 pounds since starting phentermine  37.5 mg in January, with a total body weight loss of 21%. Current BMI is 29, indicating she is no longer considered obese by BMI criteria. Insulin  levels and A1c have improved, with A1c now at 5.5, indicating reversal of glycemic issues. No current issues with glycemic control. Phentermine  is being used off-label for long-term weight loss and maintenance, supported by studies indicating its safety for extended use. No reported side effects such as palpitations or tachycardia. Blood pressure and heart rate are well-managed. Challenges include temptation from workplace snacks and occasional nocturnal cravings, though appetite control and satiety are generally well-managed with phentermine . Outcome statistics indicate a 19.3% weight loss, exceeding the typical 10% with phentermine  alone, highlighting the effectiveness of her combined approach with lifestyle changes. - Continue phentermine  37.5 mg with two refills for 60 days. - Advise on avoiding excessive caffeine and pseudoephedrine due to potential stimulant effects with phentermine . - Encourage continued adherence to 1000 calorie nutrition plan and regular physical activity, including both cardio and strength training. - Monitor weight and body composition to assess progress and adjust plan as needed. BMI 29.0-29.9,adult     Assessment and Plan      Objective   Physical Exam:  Blood pressure 114/76, pulse 97,  temperature 97.6 F (36.4 C), height 5' 2 (1.575 m), weight 159 lb (72.1 kg), SpO2 97%. Body mass index is 29.08 kg/m.  General: She is overweight, cooperative, alert, well developed, and in no acute distress. PSYCH: Has normal mood, affect and thought process.   HEENT: EOMI, sclerae are anicteric. Lungs: Normal breathing effort, no conversational dyspnea. Extremities: No edema.  Neurologic: No gross sensory or motor deficits. No tremors or fasciculations noted.    Diagnostic Data Reviewed:  BMET    Component Value Date/Time   NA 141 06/09/2021 0740   K 4.8 06/09/2021 0740   CL 102 06/09/2021 0740   CO2 26 06/09/2021 0740   GLUCOSE 96 06/09/2021 0740   GLUCOSE 97 07/05/2011 1034   BUN 18 06/09/2021 0740   CREATININE 0.51 (L) 06/09/2021 0740   CALCIUM 9.5 06/09/2021 0740   GFRNONAA 107 05/13/2020 0852   GFRAA 123 05/13/2020 0852   Lab Results  Component Value Date   HGBA1C 5.6 08/20/2023   HGBA1C 5.8 (H) 05/13/2020   Lab Results  Component Value Date   INSULIN  6.8 08/20/2023   INSULIN  17.7 05/13/2020   Lab Results  Component Value Date   TSH 1.100 06/09/2021   CBC    Component Value Date/Time   WBC 4.8 06/09/2021 0740   WBC 7.9 03/25/2012 1112   WBC 14.2 (H) 06/24/2009 0512   RBC 4.71 06/09/2021 0740   RBC 4.65 03/25/2012 1112   RBC 4.36 06/24/2009 0512   HGB 14.2 06/09/2021 0740   HGB 14.0 03/25/2012 1112   HCT 42.6 06/09/2021 0740   HCT 41.7 03/25/2012 1112   PLT 237 06/09/2021 0740  MCV 90 06/09/2021 0740   MCV 89.7 03/25/2012 1112   MCH 30.1 06/09/2021 0740   MCH 30.2 03/25/2012 1112   MCHC 33.3 06/09/2021 0740   MCHC 33.6 03/25/2012 1112   MCHC 33.4 06/24/2009 0512   RDW 12.0 06/09/2021 0740   RDW 12.8 03/25/2012 1112   Iron Studies    Component Value Date/Time   IRON 72 05/13/2020 0852   TIBC 321 05/13/2020 0852   FERRITIN 203 (H) 05/13/2020 0852   IRONPCTSAT 22 05/13/2020 0852   Lipid Panel     Component Value Date/Time   CHOL 184  06/09/2021 0740   TRIG 114 06/09/2021 0740   HDL 64 06/09/2021 0740   CHOLHDL 2.9 05/13/2020 0852   CHOLHDL 4.4 CALC 07/22/2007 1008   VLDL 17 07/22/2007 1008   LDLCALC 100 (H) 06/09/2021 0740   LDLDIRECT 143.6 07/22/2007 1008   Hepatic Function Panel     Component Value Date/Time   PROT 7.2 06/09/2021 0740   ALBUMIN 4.6 06/09/2021 0740   AST 18 06/09/2021 0740   ALT 24 06/09/2021 0740   ALKPHOS 65 06/09/2021 0740   BILITOT 0.5 06/09/2021 0740   BILIDIR 0.2 07/22/2007 1008      Component Value Date/Time   TSH 1.100 06/09/2021 0740   Nutritional Lab Results  Component Value Date   VD25OH 67.3 06/09/2021   VD25OH 69.9 03/09/2021   VD25OH 77.9 10/13/2020    Medications: Outpatient Encounter Medications as of 12/12/2023  Medication Sig   Calcium Carbonate-Vitamin D  (CALCIUM-VITAMIN D3) 600-125 MG-UNIT TABS Take by mouth.   ibuprofen (ADVIL) 200 MG tablet Take 200 mg by mouth every 6 (six) hours as needed.   loratadine (CLARITIN) 10 MG tablet Take 10 mg by mouth daily.   Multiple Vitamin (MULTIVITAMIN) tablet Take 1 tablet by mouth daily.   phentermine  (ADIPEX-P ) 37.5 MG tablet Take 1 tablet (37.5 mg total) by mouth daily before breakfast.   pseudoephedrine (SUDAFED) 30 MG tablet Take 30 mg by mouth every 4 (four) hours as needed.   venlafaxine  XR (EFFEXOR -XR) 37.5 MG 24 hr capsule Take 1 capsule (37.5 mg total) by mouth daily.   No facility-administered encounter medications on file as of 12/12/2023.     Follow-Up   No follow-ups on file.SABRA She was informed of the importance of frequent follow up visits to maximize her success with intensive lifestyle modifications for her multiple health conditions.  Attestation Statement   Reviewed by clinician on day of visit: allergies, medications, problem list, medical history, surgical history, family history, social history, and previous encounter notes.     Lucas Parker, MD

## 2023-12-12 NOTE — Assessment & Plan Note (Signed)
 Improved her Homa IR is 1.8.  She has lost 21% of her body weight.  Continue with current weight management strategy

## 2023-12-12 NOTE — Assessment & Plan Note (Addendum)
 Improved she last 21% of TBWL.

## 2023-12-12 NOTE — Assessment & Plan Note (Addendum)
 Generalized obesity with insulin  resistance and prediabetes Generalized obesity with insulin  resistance and prediabetes. Significant weight loss of 38 pounds since starting phentermine  37.5 mg in January, with a total body weight loss of 21%. Current BMI is 29, indicating she is no longer considered obese by BMI criteria. Insulin  levels and A1c have improved, with A1c now at 5.5, indicating reversal of glycemic issues. No current issues with glycemic control. Phentermine  is being used off-label for long-term weight loss and maintenance, supported by studies indicating its safety for extended use. No reported side effects such as palpitations or tachycardia. Blood pressure and heart rate are well-managed. Challenges include temptation from workplace snacks and occasional nocturnal cravings, though appetite control and satiety are generally well-managed with phentermine . Outcome statistics indicate a 19.3% weight loss, exceeding the typical 10% with phentermine  alone, highlighting the effectiveness of her combined approach with lifestyle changes. - Continue phentermine  37.5 mg with two refills for 60 days. - Advise on avoiding excessive caffeine and pseudoephedrine due to potential stimulant effects with phentermine . - Encourage continued adherence to 1000 calorie nutrition plan and regular physical activity, including both cardio and strength training. - Monitor weight and body composition to assess progress and adjust plan as needed.

## 2024-01-10 ENCOUNTER — Ambulatory Visit (INDEPENDENT_AMBULATORY_CARE_PROVIDER_SITE_OTHER): Admitting: Internal Medicine

## 2024-01-10 ENCOUNTER — Encounter (INDEPENDENT_AMBULATORY_CARE_PROVIDER_SITE_OTHER): Payer: Self-pay | Admitting: Internal Medicine

## 2024-01-10 VITALS — BP 105/71 | HR 86 | Temp 97.8°F | Ht 62.0 in | Wt 157.0 lb

## 2024-01-10 DIAGNOSIS — E88819 Insulin resistance, unspecified: Secondary | ICD-10-CM | POA: Diagnosis not present

## 2024-01-10 DIAGNOSIS — R7303 Prediabetes: Secondary | ICD-10-CM

## 2024-01-10 DIAGNOSIS — E669 Obesity, unspecified: Secondary | ICD-10-CM | POA: Diagnosis not present

## 2024-01-10 DIAGNOSIS — Z6828 Body mass index (BMI) 28.0-28.9, adult: Secondary | ICD-10-CM

## 2024-01-10 NOTE — Assessment & Plan Note (Addendum)
 Weight: decrease of 40 lb (20.3%) over 3 years, 8 months  Start: 05/13/2020 197 lb (89.4 kg)  End: 01/10/2024 157 lb (71.2 kg)  Continue 1000-calorie nutrition plan Continue physical activity at current volume Continue phentermine  at current dose blood pressure heart rate of well-controlled. PMDP reviewed today. Obesity management is ongoing with significant progress. BMI has decreased to 28, and fat mass has reduced from 90 pounds to 60 pounds since 2022. Current fat percentage is 38%, with a goal of 34%. Weight loss has been gradual due to nearing essential fat levels. Phentermine  has contributed to an 18-pound weight loss since January, achieving more than the expected 5% reduction. Insulin  resistance has improved, with insulin  levels dropping from 15.6 to 6.8, and A1c is well-managed at 5.6. Emotional and stress-related eating is identified as a challenge, with snacking on cashews noted. Physical activity is consistent at 300 minutes per week, and muscle mass is stable. Psychological factors, rather than hunger, are influencing eating habits. Topiramate  was previously trialed but altered taste, and appetite suppression is not currently needed. - Continue phentermine  with one refill remaining. - Maintain current physical activity level of 300 minutes per week. - Set alarms for structured meal times to manage snacking. - Monitor emotional and stress-related eating habits. - Schedule A1c test during February physical. - Reassess weight management plan in one month.

## 2024-01-10 NOTE — Progress Notes (Signed)
 Office: [phone]  /  Fax: [phone]  Weight Summary and Body Composition Analysis (BIA)  Vitals Temp: 97.8 F (36.6 C) BP: 105/71 Pulse Rate: 86 SpO2: 97 %   Anthropometric Measurements Height: 5' 2 (1.575 m) Weight: 157 lb (71.2 kg) BMI (Calculated): 28.71 Weight at Last Visit: 159 lb Weight Lost Since Last Visit: 2 lb Weight Gained Since Last Visit: 0 lb Starting Weight: 197 lb Total Weight Loss (lbs): 40 lb (18.1 kg) Peak Weight: 197 lb   Body Composition  Body Fat %: 38.3 % Fat Mass (lbs): 60.2 lbs Muscle Mass (lbs): 92.2 lbs Total Body Water (lbs): 66.4 lbs Visceral Fat Rating : 9     Today's Visit #: 50  Starting Date: 05/12/20   Subjective   Chief Complaint: Obesity  Interval History Discussed the use of AI scribe software for clinical note transcription with the patient, who gave verbal consent to proceed.  History of Present Illness Carmen Garza is a 58 year old female with obesity who presents for weight management follow-up.  She has been actively managing her weight, achieving a weight loss of two pounds since her last visit. Her BMI is currently 28, and she has lost approximately 20% of her body weight since starting her weight loss journey. She aims to lose six more pounds to reach her weight from June 2023.  She follows a 1,000 calorie diet and engages in physical activity five days a week for at least 60 minutes per session. She feels stronger. However, she struggles with snacking, particularly on cashews, which she attributes to stress and boredom rather than hunger. To manage her eating schedule, she sets alarms for meals and tries to avoid eating between them.  She started phentermine  in January 2025, which has contributed to an 18-pound weight loss, approximately 10% of her body weight. She previously tried topiramate  and Qsymia  but discontinued due to altered taste and lack of significant benefit. She does not feel hungry but  acknowledges the need to manage her willpower and emotional eating.  Her fat mass was 90 pounds in 2022 and is 60 pounds currently. Her current fat percentage is 38%.  She has a history of insulin  resistance and prediabetes, but recent tests show improvement. Her A1c was 5.6 in May 2025, and her insulin  level decreased from 15.6 in March 2025 to 6.8.  She enjoys spending time with her grandchildren, which contributes to her physical activity. She feels satisfied and full after meals, especially when she stays hydrated. No issues with sleep or stress affecting her weight management.     Challenges affecting patient progress: some snacking.    Pharmacotherapy for weight management: She is currently taking Phentermine  (longterm use, off-label, single agent)  with adequate clinical response  and without side effects..   Assessment and Plan   Treatment Plan For Obesity:  Recommended Dietary Goals  [name] is currently in the action stage of change. As such, her goal is to continue weight management plan. She has agreed to: continue current plan  Behavioral Health and Counseling  We discussed the following behavioral modification strategies today: practice mindfulness eating and understand the difference between hunger signals and cravings, avoiding temptations and identifying enticing environmental cues, continue to work on maintaining a reduced calorie state, getting the recommended amount of protein, incorporating whole foods, making healthy choices, staying well hydrated and practicing mindfulness when eating., and increase protein intake, fibrous foods (25 grams per day for women, 30 grams for men) and water to  improve satiety and decrease hunger signals. .  Additional education and resources provided today: None  Recommended Physical Activity Goals  [name] has been advised to work up to 150 minutes of moderate intensity aerobic activity a week and strengthening exercises 2-3 times per  week for cardiovascular health, weight loss maintenance and preservation of muscle mass.  She has agreed to :  Increase volume of physical activity to a goal of 240 minutes a week and Combine aerobic and strengthening exercises for efficiency and improved cardiometabolic health.  Medical Interventions and Pharmacotherapy  We discussed various medication options to help [name] with her weight loss efforts and we both agreed to : Adequate clinical response to anti-obesity medication, continue current regimen  Associated Conditions Impacted by Obesity Treatment  Assessment & Plan Insulin  resistance Improved her Homa IR is 1.8.  She has lost 21% of her body weight.  Continue with current weight management strategy Prediabetes Most A1c was 5.6.  Improved she has lost 21% of TBWL.  Continue current weight management strategy.  Consider pharmacoprophylaxis with metformin if weight loss plateaus Generalized obesity - starting BMI 36 in 2022 BMI 28.0-28.9,adult Weight: decrease of 40 lb (20.3%) over 3 years, 8 months  Start: 05/13/2020 197 lb (89.4 kg)  End: 01/10/2024 157 lb (71.2 kg)  Continue 1000-calorie nutrition plan Continue physical activity at current volume Continue phentermine  at current dose blood pressure heart rate of well-controlled. PMDP reviewed today. Obesity management is ongoing with significant progress. BMI has decreased to 28, and fat mass has reduced from 90 pounds to 60 pounds since 2022. Current fat percentage is 38%, with a goal of 34%. Weight loss has been gradual due to nearing essential fat levels. Phentermine  has contributed to an 18-pound weight loss since January, achieving more than the expected 5% reduction. Insulin  resistance has improved, with insulin  levels dropping from 15.6 to 6.8, and A1c is well-managed at 5.6. Emotional and stress-related eating is identified as a challenge, with snacking on cashews noted. Physical activity is consistent at 300 minutes per  week, and muscle mass is stable. Psychological factors, rather than hunger, are influencing eating habits. Topiramate  was previously trialed but altered taste, and appetite suppression is not currently needed. - Continue phentermine  with one refill remaining. - Maintain current physical activity level of 300 minutes per week. - Set alarms for structured meal times to manage snacking. - Monitor emotional and stress-related eating habits. - Schedule A1c test during February physical. - Reassess weight management plan in one month.           Objective   Physical Exam:  Blood pressure 105/71, pulse 86, temperature 97.8 F (36.6 C), height 5' 2 (1.575 m), weight 157 lb (71.2 kg), SpO2 97%. Body mass index is 28.72 kg/m.  General: She is overweight, cooperative, alert, well developed, and in no acute distress. PSYCH: Has normal mood, affect and thought process.   HEENT: EOMI, sclerae are anicteric. Lungs: Normal breathing effort, no conversational dyspnea. Extremities: No edema.  Neurologic: No gross sensory or motor deficits. No tremors or fasciculations noted.    Diagnostic Data Reviewed:  BMET    Component Value Date/Time   NA 141 06/09/2021 0740   K 4.8 06/09/2021 0740   CL 102 06/09/2021 0740   CO2 26 06/09/2021 0740   GLUCOSE 96 06/09/2021 0740   GLUCOSE 97 07/05/2011 1034   BUN 18 06/09/2021 0740   CREATININE 0.51 (L) 06/09/2021 0740   CALCIUM 9.5 06/09/2021 0740   GFRNONAA 107 05/13/2020 9147  GFRAA 123 05/13/2020 0852   Lab Results  Component Value Date   HGBA1C 5.6 08/20/2023   HGBA1C 5.8 (H) 05/13/2020   Lab Results  Component Value Date   INSULIN  6.8 08/20/2023   INSULIN  17.7 05/13/2020   Lab Results  Component Value Date   TSH 1.100 06/09/2021   CBC    Component Value Date/Time   WBC 4.8 06/09/2021 0740   WBC 7.9 03/25/2012 1112   WBC 14.2 (H) 06/24/2009 0512   RBC 4.71 06/09/2021 0740   RBC 4.65 03/25/2012 1112   RBC 4.36 06/24/2009 0512    HGB 14.2 06/09/2021 0740   HGB 14.0 03/25/2012 1112   HCT 42.6 06/09/2021 0740   HCT 41.7 03/25/2012 1112   PLT 237 06/09/2021 0740   MCV 90 06/09/2021 0740   MCV 89.7 03/25/2012 1112   MCH 30.1 06/09/2021 0740   MCH 30.2 03/25/2012 1112   MCHC 33.3 06/09/2021 0740   MCHC 33.6 03/25/2012 1112   MCHC 33.4 06/24/2009 0512   RDW 12.0 06/09/2021 0740   RDW 12.8 03/25/2012 1112   Iron Studies    Component Value Date/Time   IRON 72 05/13/2020 0852   TIBC 321 05/13/2020 0852   FERRITIN 203 (H) 05/13/2020 0852   IRONPCTSAT 22 05/13/2020 0852   Lipid Panel     Component Value Date/Time   CHOL 184 06/09/2021 0740   TRIG 114 06/09/2021 0740   HDL 64 06/09/2021 0740   CHOLHDL 2.9 05/13/2020 0852   CHOLHDL 4.4 CALC 07/22/2007 1008   VLDL 17 07/22/2007 1008   LDLCALC 100 (H) 06/09/2021 0740   LDLDIRECT 143.6 07/22/2007 1008   Hepatic Function Panel     Component Value Date/Time   PROT 7.2 06/09/2021 0740   ALBUMIN 4.6 06/09/2021 0740   AST 18 06/09/2021 0740   ALT 24 06/09/2021 0740   ALKPHOS 65 06/09/2021 0740   BILITOT 0.5 06/09/2021 0740   BILIDIR 0.2 07/22/2007 1008      Component Value Date/Time   TSH 1.100 06/09/2021 0740   Nutritional Lab Results  Component Value Date   VD25OH 67.3 06/09/2021   VD25OH 69.9 03/09/2021   VD25OH 77.9 10/13/2020    Medications: Outpatient Encounter Medications as of 01/10/2024  Medication Sig   Calcium Carbonate-Vitamin D  (CALCIUM-VITAMIN D3) 600-125 MG-UNIT TABS Take by mouth.   ibuprofen (ADVIL) 200 MG tablet Take 200 mg by mouth every 6 (six) hours as needed.   loratadine (CLARITIN) 10 MG tablet Take 10 mg by mouth daily.   Multiple Vitamin (MULTIVITAMIN) tablet Take 1 tablet by mouth daily.   phentermine  (ADIPEX-P ) 37.5 MG tablet Take 1 tablet (37.5 mg total) by mouth daily before breakfast.   pseudoephedrine (SUDAFED) 30 MG tablet Take 30 mg by mouth every 4 (four) hours as needed.   venlafaxine  XR (EFFEXOR -XR) 37.5 MG  24 hr capsule Take 1 capsule (37.5 mg total) by mouth daily.   No facility-administered encounter medications on file as of 01/10/2024.     Follow-Up   No follow-ups on file.SABRA She was informed of the importance of frequent follow up visits to maximize her success with intensive lifestyle modifications for her multiple health conditions.  Attestation Statement   Reviewed by clinician on day of visit: allergies, medications, problem list, medical history, surgical history, family history, social history, and previous encounter notes.     Lucas Parker, MD

## 2024-01-10 NOTE — Assessment & Plan Note (Signed)
 Improved her Homa IR is 1.8.  She has lost 21% of her body weight.  Continue with current weight management strategy

## 2024-01-10 NOTE — Assessment & Plan Note (Addendum)
 Most A1c was 5.6.  Improved she has lost 21% of TBWL.  Continue current weight management strategy.  Consider pharmacoprophylaxis with metformin if weight loss plateaus

## 2024-01-24 DIAGNOSIS — J0110 Acute frontal sinusitis, unspecified: Secondary | ICD-10-CM | POA: Diagnosis not present

## 2024-02-05 ENCOUNTER — Encounter (INDEPENDENT_AMBULATORY_CARE_PROVIDER_SITE_OTHER): Payer: Self-pay | Admitting: Internal Medicine

## 2024-02-05 ENCOUNTER — Ambulatory Visit (INDEPENDENT_AMBULATORY_CARE_PROVIDER_SITE_OTHER): Admitting: Internal Medicine

## 2024-02-05 VITALS — BP 121/77 | HR 79 | Temp 97.9°F | Ht 62.0 in | Wt 154.0 lb

## 2024-02-05 DIAGNOSIS — E669 Obesity, unspecified: Secondary | ICD-10-CM

## 2024-02-05 DIAGNOSIS — R7303 Prediabetes: Secondary | ICD-10-CM | POA: Diagnosis not present

## 2024-02-05 DIAGNOSIS — Z8543 Personal history of malignant neoplasm of ovary: Secondary | ICD-10-CM | POA: Insufficient documentation

## 2024-02-05 DIAGNOSIS — Z780 Asymptomatic menopausal state: Secondary | ICD-10-CM

## 2024-02-05 DIAGNOSIS — M8580 Other specified disorders of bone density and structure, unspecified site: Secondary | ICD-10-CM

## 2024-02-05 DIAGNOSIS — E88819 Insulin resistance, unspecified: Secondary | ICD-10-CM

## 2024-02-05 MED ORDER — PHENTERMINE HCL 37.5 MG PO TABS: 37.5000 mg | ORAL_TABLET | Freq: Every day | ORAL | 1 refills | Status: DC

## 2024-02-05 NOTE — Assessment & Plan Note (Signed)
 Most A1c was 5.6.  Improved she has lost 21% of TBWL.  Continue current weight management strategy.  Consider pharmacoprophylaxis with metformin if weight loss plateaus

## 2024-02-05 NOTE — Assessment & Plan Note (Signed)
 Ovarian cancer treated in 2011 with surgery and chemotherapy. Currently in remission with annual surveillance. No active treatment required. Surveillance includes annual CA-125 testing and gynecological check-ups. - Continue annual surveillance with gynecology and CA-125 testing

## 2024-02-05 NOTE — Assessment & Plan Note (Addendum)
   Obesity with prediabetes and insulin  resistance Obesity with associated prediabetes and insulin  resistance. She has been on phentermine  long-term, which is being used off-label. She has lost 21 pounds over the last nine months and 43 pounds from her peak weight, with a 21% reduction. Body fat percentage has decreased to 37%, with a goal of under 34%. Muscle mass has slightly increased. No significant side effects from phentermine  reported, and blood pressure and heart rate are well controlled. Phentermine  is effective and can be used up to two years based on studies, with monitoring for tolerance and effectiveness. - Continue phentermine  with refill sent to pharmacy - Follow 1000 calorie nutrition plan - Continue exercise regimen of cardio and strengthening five days a week - Monitor weight loss and effectiveness of phentermine  - Reassess if tolerance develops or weight loss plateaus

## 2024-02-05 NOTE — Assessment & Plan Note (Signed)
 Improved her Homa IR is 1.8.  She has lost 21% of her body weight.  Continue with current weight management strategy

## 2024-02-05 NOTE — Assessment & Plan Note (Signed)
 Postmenopausal symptoms managed with venlafaxine  Postmenopausal symptoms managed with venlafaxine , initially started for hot flashes after hysterectomy. No significant side effects reported. Venlafaxine  is being used for postmenopausal symptoms rather than depression or anxiety. - Continue venlafaxine  as prescribed

## 2024-02-05 NOTE — Progress Notes (Signed)
 Home adenomectomy 8  Office: [phone]  /  Fax: [phone]  Weight Summary and Body Composition Analysis (BIA)  Vitals Temp: 97.9 F (36.6 C) BP: 121/77 Pulse Rate: 79 SpO2: 99 %   Anthropometric Measurements Height: 5' 2 (1.575 m) Weight: 154 lb (69.9 kg) BMI (Calculated): 28.16 Weight at Last Visit: 157 lb Weight Lost Since Last Visit: 3 lb Weight Gained Since Last Visit: 0 lb Starting Weight: 197 lb Total Weight Loss (lbs): 43 lb (19.5 kg) Peak Weight: 197   Body Composition  Body Fat %: 37 % Fat Mass (lbs): 57.2 lbs Muscle Mass (lbs): 92.6 lbs Total Body Water (lbs): 63.8 lbs Visceral Fat Rating : 9     Today's Visit #: 51  Starting Date: 05/12/20   Subjective   Chief Complaint: Obesity  Interval History Discussed the use of AI scribe software for clinical note transcription with the patient, who gave verbal consent to proceed.  History of Present Illness Carmen Garza is a 58 year old female with prediabetes and insulin  resistance who presents for medical weight management.  Carmen Garza is currently on phentermine  and adheres to a one thousand calorie nutrition plan about fifty percent of the time. Carmen Garza tracks Carmen Garza food intake, consumes more whole foods, meets the recommended protein intake, and maintains adequate hydration. No skipped meals are reported. Carmen Garza exercises five days a week for sixty minutes, focusing on cardio and strengthening exercises. Over the last nine months, Carmen Garza has lost twenty-one pounds, with a total weight loss of forty-three pounds from Carmen Garza peak weight.  Carmen Garza recently experienced a fall in a parking lot, resulting in knee pain. The pain is localized to the knee and radiates upwards, described as pulling, with some swelling and bruising. Carmen Garza denies catching, locking, or buckling of the knee. Carmen Garza uses ibuprofen for pain relief, as Tylenol  is ineffective.  Carmen Garza past medical history includes ovarian cancer treated in 2011 with surgery and  chemotherapy, currently in remission with annual surveillance. Carmen Garza also has a history of osteopenia post-menopause, with a recent decrease in bone density. Carmen Garza is considering treatment options due to a family history of osteoporosis.  Carmen Garza is currently taking venlafaxine , which was started for postmenopausal symptoms following a hysterectomy. No significant side effects from Carmen Garza medications, including phentermine , are reported, and Carmen Garza blood pressure and heart rate are well controlled.       Challenges affecting patient progress: none.    Pharmacotherapy for weight management: Carmen Garza is currently taking Phentermine  (longterm use, off-label, single agent)  with adequate clinical response  and without side effects..   Assessment and Plan   Treatment Plan For Obesity:  Recommended Dietary Goals  Carmen Garza is currently in the action stage of change. As such, Carmen Garza goal is to continue weight management plan. Carmen Garza has agreed to: continue current plan  Behavioral Health and Counseling  We discussed the following behavioral modification strategies today: practice mindfulness eating and understand the difference between hunger signals and cravings, avoiding temptations and identifying enticing environmental cues, continue to work on maintaining a reduced calorie state, getting the recommended amount of protein, incorporating whole foods, making healthy choices, staying well hydrated and practicing mindfulness when eating., and increase protein intake, fibrous foods (25 grams per day for women, 30 grams for men) and water to improve satiety and decrease hunger signals. .  Additional education and resources provided today: None  Recommended Physical Activity Goals  Carmen Garza has been advised to work up to 150 minutes of moderate intensity aerobic activity a  week and strengthening exercises 2-3 times per week for cardiovascular health, weight loss maintenance and preservation of muscle mass.  Carmen Garza has agreed  to :  Increase volume of physical activity to a goal of 240 minutes a week and Combine aerobic and strengthening exercises for efficiency and improved cardiometabolic health.  Medical Interventions and Pharmacotherapy  We discussed various medication options to help Carmen Garza with Carmen Garza weight loss efforts and we both agreed to : Adequate clinical response to anti-obesity medication, continue current regimen   Medication safety: Reviewed common side effects of phentermine  Reviewed for contraindications, none present Reviewed state registry for controlled substances no other controlled substances found Medication will be discontinued if less than 5% weight loss in 6 months Discussed safety data and off label use for long-term treatment of obesity.     Associated Conditions Impacted by Obesity Treatment  Assessment & Plan Prediabetes Most A1c was 5.6.  Improved Carmen Garza has lost 21% of TBWL.  Continue current weight management strategy.  Consider pharmacoprophylaxis with metformin if weight loss plateaus Insulin  resistance Improved Carmen Garza Homa IR is 1.8.  Carmen Garza has lost 21% of Carmen Garza body weight.  Continue with current weight management strategy Generalized obesity - starting BMI 36 in 2022   Obesity with prediabetes and insulin  resistance Obesity with associated prediabetes and insulin  resistance. Carmen Garza has been on phentermine  long-term, which is being used off-label. Carmen Garza has lost 21 pounds over the last nine months and 43 pounds from Carmen Garza peak weight, with a 21% reduction. Body fat percentage has decreased to 37%, with a goal of under 34%. Muscle mass has slightly increased. No significant side effects from phentermine  reported, and blood pressure and heart rate are well controlled. Phentermine  is effective and can be used up to two years based on studies, with monitoring for tolerance and effectiveness. - Continue phentermine  with refill sent to pharmacy - Follow 1000 calorie nutrition plan - Continue  exercise regimen of cardio and strengthening five days a week - Monitor weight loss and effectiveness of phentermine  - Reassess if tolerance develops or weight loss plateaus Hx of ovarian cancer - tx 2011 , surgical and chemo, in remission, annual surveillance with gynecology Ovarian cancer treated in 2011 with surgery and chemotherapy. Currently in remission with annual surveillance. No active treatment required. Surveillance includes annual CA-125 testing and gynecological check-ups. 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 Bone density has shown a decrease, possibly due to weight loss. Family history of osteoporosis. Strength training is being performed, and calcium supplementation is recommended. Discussion with PCP about starting Fosamax or similar medication is planned. - Continue strength training exercises - Ensure adequate calcium intake - Discuss potential start of Fosamax with PCP in February Post-menopausal Postmenopausal symptoms managed with venlafaxine  Postmenopausal symptoms managed with venlafaxine , initially started for hot flashes after hysterectomy. No significant side effects reported. Venlafaxine  is being used for postmenopausal symptoms rather than depression or anxiety. - Continue venlafaxine  as prescribed          Objective   Physical Exam:  Blood pressure 121/77, pulse 79, temperature 97.9 F (36.6 C), height 5' 2 (1.575 m), weight 154 lb (69.9 kg), SpO2 99%. Body mass index is 28.17 kg/m.  General: Carmen Garza is overweight, cooperative, alert, well developed, and in no acute distress. PSYCH: Has normal mood, affect and thought process.   HEENT: EOMI, sclerae are anicteric. Lungs: Normal breathing effort, no conversational  dyspnea. Extremities: No edema.  Neurologic: No gross sensory or motor  deficits. No tremors or fasciculations noted.    Diagnostic Data Reviewed:  BMET    Component Value Date/Time   NA 141 06/09/2021 0740   K 4.8 06/09/2021 0740   CL 102 06/09/2021 0740   CO2 26 06/09/2021 0740   GLUCOSE 96 06/09/2021 0740   GLUCOSE 97 07/05/2011 1034   BUN 18 06/09/2021 0740   CREATININE 0.51 (L) 06/09/2021 0740   CALCIUM 9.5 06/09/2021 0740   GFRNONAA 107 05/13/2020 0852   GFRAA 123 05/13/2020 0852   Lab Results  Component Value Date   HGBA1C 5.6 08/20/2023   HGBA1C 5.8 (H) 05/13/2020   Lab Results  Component Value Date   INSULIN  6.8 08/20/2023   INSULIN  17.7 05/13/2020   Lab Results  Component Value Date   TSH 1.100 06/09/2021   CBC    Component Value Date/Time   WBC 4.8 06/09/2021 0740   WBC 7.9 03/25/2012 1112   WBC 14.2 (H) 06/24/2009 0512   RBC 4.71 06/09/2021 0740   RBC 4.65 03/25/2012 1112   RBC 4.36 06/24/2009 0512   HGB 14.2 06/09/2021 0740   HGB 14.0 03/25/2012 1112   HCT 42.6 06/09/2021 0740   HCT 41.7 03/25/2012 1112   PLT 237 06/09/2021 0740   MCV 90 06/09/2021 0740   MCV 89.7 03/25/2012 1112   MCH 30.1 06/09/2021 0740   MCH 30.2 03/25/2012 1112   MCHC 33.3 06/09/2021 0740   MCHC 33.6 03/25/2012 1112   MCHC 33.4 06/24/2009 0512   RDW 12.0 06/09/2021 0740   RDW 12.8 03/25/2012 1112   Iron Studies    Component Value Date/Time   IRON 72 05/13/2020 0852   TIBC 321 05/13/2020 0852   FERRITIN 203 (H) 05/13/2020 0852   IRONPCTSAT 22 05/13/2020 0852   Lipid Panel     Component Value Date/Time   CHOL 184 06/09/2021 0740   TRIG 114 06/09/2021 0740   HDL 64 06/09/2021 0740   CHOLHDL 2.9 05/13/2020 0852   CHOLHDL 4.4 CALC 07/22/2007 1008   VLDL 17 07/22/2007 1008   LDLCALC 100 (H) 06/09/2021 0740   LDLDIRECT 143.6 07/22/2007 1008   Hepatic Function Panel     Component Value Date/Time   PROT 7.2 06/09/2021 0740   ALBUMIN 4.6 06/09/2021 0740   AST 18 06/09/2021 0740   ALT 24 06/09/2021 0740   ALKPHOS 65 06/09/2021  0740   BILITOT 0.5 06/09/2021 0740   BILIDIR 0.2 07/22/2007 1008      Component Value Date/Time   TSH 1.100 06/09/2021 0740   Nutritional Lab Results  Component Value Date   VD25OH 67.3 06/09/2021   VD25OH 69.9 03/09/2021   VD25OH 77.9 10/13/2020    Medications: Outpatient Encounter Medications as of 02/05/2024  Medication Sig   Calcium Carbonate-Vitamin D  (CALCIUM-VITAMIN D3) 600-125 MG-UNIT TABS Take by mouth.   ibuprofen (ADVIL) 200 MG tablet Take 200 mg by mouth every 6 (six) hours as needed.   loratadine (CLARITIN) 10 MG tablet Take 10 mg by mouth daily.   Multiple Vitamin (MULTIVITAMIN) tablet Take 1 tablet by mouth daily.   phentermine  (ADIPEX-P ) 37.5 MG tablet Take 1 tablet (37.5 mg total) by mouth daily before breakfast.   pseudoephedrine (SUDAFED) 30 MG tablet Take 30 mg by mouth every 4 (four) hours as needed.   venlafaxine  XR (EFFEXOR -XR) 37.5 MG 24 hr capsule Take 1 capsule (37.5 mg total) by mouth daily.   No facility-administered encounter medications on file  as of 02/05/2024.     Follow-Up   No follow-ups on file.SABRA Carmen Garza was informed of the importance of frequent follow up visits to maximize Carmen Garza success with intensive lifestyle modifications for Carmen Garza multiple health conditions.  Attestation Statement   Reviewed by clinician on day of visit: allergies, medications, problem list, medical history, surgical history, family history, social history, and previous encounter notes.     Lucas Parker, MD

## 2024-02-05 NOTE — Assessment & Plan Note (Signed)
 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 Bone density has shown a decrease, possibly due to weight loss. Family history of osteoporosis. Strength training is being performed, and calcium supplementation is recommended. Discussion with PCP about starting Fosamax or similar medication is planned. - Continue strength training exercises - Ensure adequate calcium intake - Discuss potential start of Fosamax with PCP in February

## 2024-02-26 ENCOUNTER — Encounter (INDEPENDENT_AMBULATORY_CARE_PROVIDER_SITE_OTHER): Payer: Self-pay | Admitting: Internal Medicine

## 2024-02-26 ENCOUNTER — Ambulatory Visit (INDEPENDENT_AMBULATORY_CARE_PROVIDER_SITE_OTHER): Payer: Self-pay | Admitting: Internal Medicine

## 2024-02-26 VITALS — BP 115/80 | HR 91 | Temp 97.9°F | Ht 62.0 in | Wt 156.0 lb

## 2024-02-26 DIAGNOSIS — E669 Obesity, unspecified: Secondary | ICD-10-CM | POA: Diagnosis not present

## 2024-02-26 DIAGNOSIS — E88819 Insulin resistance, unspecified: Secondary | ICD-10-CM

## 2024-02-26 DIAGNOSIS — Z6828 Body mass index (BMI) 28.0-28.9, adult: Secondary | ICD-10-CM

## 2024-02-26 DIAGNOSIS — R638 Other symptoms and signs concerning food and fluid intake: Secondary | ICD-10-CM

## 2024-02-26 DIAGNOSIS — R7303 Prediabetes: Secondary | ICD-10-CM

## 2024-02-26 NOTE — Assessment & Plan Note (Signed)
 Improved her Homa IR is 1.8.  She has lost 21% of her body weight.  Continue with current weight management strategy

## 2024-02-26 NOTE — Assessment & Plan Note (Signed)
 Weight: decrease of 41 lb (20.8%) over 3 years, 9 months  Start: 05/13/2020 197 lb (89.4 kg)  End: 02/26/2024 156 lb (70.8 kg)  See obesity treatment plan above

## 2024-02-26 NOTE — Progress Notes (Signed)
 Office: [phone]  /  Fax: [phone]  Weight Summary and Body Composition Analysis (BIA)  Vitals Temp: 97.9 F (36.6 C) BP: 115/80 Pulse Rate: 91 SpO2: 100 %   Anthropometric Measurements Height: 5' 2 (1.575 m) Weight: 156 lb (70.8 kg) BMI (Calculated): 28.53 Weight at Last Visit: 154 lb Weight Lost Since Last Visit: 0 lb Weight Gained Since Last Visit: 2 lb Starting Weight: 197 lb Total Weight Loss (lbs): 41 lb (18.6 kg) Peak Weight: 197 lb   Body Composition  Body Fat %: 37.9 % Fat Mass (lbs): 59.2 lbs Muscle Mass (lbs): 92.2 lbs Total Body Water (lbs): 66 lbs Visceral Fat Rating : 9    RMR: 1267  Today's Visit #: 52  Starting Date: 05/12/20   Subjective   Chief Complaint: Obesity  Interval History Discussed the use of AI scribe software for clinical note transcription with the patient, who gave verbal consent to proceed.  History of Present Illness Carmen Garza is a 58 year old female who presents for medical weight management.  She has been on a medical weight management program and since her last visit, she has gained two pounds, despite previously losing 21 pounds over nine months. She attributes the recent weight gain to increased consumption of sweets during Halloween and other events, as well as a decrease in physical activity due to knee issues.  She has been taking phentermine  since January, which initially helped her lose 21 pounds, equating to a 12% weight reduction from her starting weight of 175 pounds. However, the medication's effect on her appetite has diminished, and she attributes her recent weight gain to 'choices' and environmental factors rather than the medication's efficacy.  She experiences increased emotional hunger and stress eating, particularly during recent family events and holidays. She has a history of stress-related overeating, especially with comfort foods like sweets and salty snacks. She has been trying to manage  her cravings and maintain her physical activity, although her knee issues have limited her ability to jog or run as she used to.  Her current medication includes phentermine , and she continues to monitor her blood pressure and heart rate. She continues to consume coffee but denies experiencing jitteriness or palpitations.  She has faced significant stressors over the past year, including the loss of her godson and caring for a critically ill family member, which have impacted her weight management efforts.     Challenges affecting patient progress: strong hunger signals and/or impaired satiety / inhibitory control, menopause, and emotional eating.    Pharmacotherapy for weight management: She is currently taking Phentermine  (longterm use, off-label, single agent)  with adequate clinical response  and without side effects..  She has been on medication since January 2025 and has lost 12% of total body weight since starting medication.  Assessment and Plan   Treatment Plan For Obesity:  Recommended Dietary Goals  Carmen Garza is currently in the action stage of change. As such, her goal is to continue weight management plan. She has agreed to: continue current plan  Behavioral Health and Counseling  We discussed the following behavioral modification strategies today: continue to work on maintaining a reduced calorie state, getting the recommended amount of protein, incorporating whole foods, making healthy choices, staying well hydrated and practicing mindfulness when eating. and increase protein intake, fibrous foods (25 grams per day for women, 30 grams for men) and water to improve satiety and decrease hunger signals. .  Additional education and resources provided today: Handout on staying on  track during the holidays  Recommended Physical Activity Goals  Carmen Garza has been advised to work up to 150 minutes of moderate intensity aerobic activity a week and strengthening exercises 2-3 times per  week for cardiovascular health, weight loss maintenance and preservation of muscle mass.  She has agreed to :  Think about enjoyable ways to increase daily physical activity and overcoming barriers to exercise, Increase physical activity in their day and reduce sedentary time (increase NEAT)., Increase volume of physical activity to a goal of 240 minutes a week, and Combine aerobic and strengthening exercises for efficiency and improved cardiometabolic health.  Medical Interventions and Pharmacotherapy  We discussed various medication options to help Carmen Garza with her weight loss efforts and we both agreed to : Adequate clinical response to anti-obesity medication, continue current regimen  We have discussed that long-term use of phentermine  for weight management. Patient informed that use beyond 12 weeks is off-label. Reviewed evidence supporting extended use in select patients with regular monitoring. Risks discussed: insomnia, increased heart rate, elevated BP, and potential for dependence.    Patient verbalized understanding and agreed to continue phentermine  with close monitoring. Plan includes regular follow-up every 4-6 weeks to assess efficacy, side effects, and vitals.  Medication safety: Reviewed common side effects of phentermine , no side effects reported.  Reviewed vitals signs and they are stable Reviewed for contraindications, none present Reviewed state registry for controlled substances and no other controlled substances found She has lost more than 12% total body weight Discussed safety data and off label use for long-term treatment of obesity.   Associated Conditions Impacted by Obesity Treatment  Assessment & Plan Insulin  resistance Improved her Homa IR is 1.8.  She has lost 21% of her body weight.  Continue with current weight management strategy Prediabetes Most A1c was 5.6.  Improved she has lost 21% of TBWL.  Continue current weight management strategy.  Consider  pharmacoprophylaxis with metformin if weight loss plateaus Generalized obesity - starting BMI 36 in 2022 BMI 28.0-28.9,adult Weight: decrease of 41 lb (20.8%) over 3 years, 9 months  Start: 05/13/2020 197 lb (89.4 kg)  End: 02/26/2024 156 lb (70.8 kg)  See obesity treatment plan above Abnormal food appetite On phentermine  with adequate clinical response.  No side effects reported continue with current nutritional strategies and pharmacotherapy.          Objective   Physical Exam:  Blood pressure 115/80, pulse 91, temperature 97.9 F (36.6 C), height 5' 2 (1.575 m), weight 156 lb (70.8 kg), SpO2 100%. Body mass index is 28.53 kg/m.  General: She is overweight, cooperative, alert, well developed, and in no acute distress. PSYCH: Has normal mood, affect and thought process.   HEENT: EOMI, sclerae are anicteric. Lungs: Normal breathing effort, no conversational dyspnea. Extremities: No edema.  Neurologic: No gross sensory or motor deficits. No tremors or fasciculations noted.    Diagnostic Data Reviewed:  BMET    Component Value Date/Time   NA 141 06/09/2021 0740   K 4.8 06/09/2021 0740   CL 102 06/09/2021 0740   CO2 26 06/09/2021 0740   GLUCOSE 96 06/09/2021 0740   GLUCOSE 97 07/05/2011 1034   BUN 18 06/09/2021 0740   CREATININE 0.51 (L) 06/09/2021 0740   CALCIUM 9.5 06/09/2021 0740   GFRNONAA 107 05/13/2020 0852   GFRAA 123 05/13/2020 0852   Lab Results  Component Value Date   HGBA1C 5.6 08/20/2023   HGBA1C 5.8 (H) 05/13/2020   Lab Results  Component Value Date  INSULIN  6.8 08/20/2023   INSULIN  17.7 05/13/2020   Lab Results  Component Value Date   TSH 1.100 06/09/2021   CBC    Component Value Date/Time   WBC 4.8 06/09/2021 0740   WBC 7.9 03/25/2012 1112   WBC 14.2 (H) 06/24/2009 0512   RBC 4.71 06/09/2021 0740   RBC 4.65 03/25/2012 1112   RBC 4.36 06/24/2009 0512   HGB 14.2 06/09/2021 0740   HGB 14.0 03/25/2012 1112   HCT 42.6 06/09/2021 0740    HCT 41.7 03/25/2012 1112   PLT 237 06/09/2021 0740   MCV 90 06/09/2021 0740   MCV 89.7 03/25/2012 1112   MCH 30.1 06/09/2021 0740   MCH 30.2 03/25/2012 1112   MCHC 33.3 06/09/2021 0740   MCHC 33.6 03/25/2012 1112   MCHC 33.4 06/24/2009 0512   RDW 12.0 06/09/2021 0740   RDW 12.8 03/25/2012 1112   Iron Studies    Component Value Date/Time   IRON 72 05/13/2020 0852   TIBC 321 05/13/2020 0852   FERRITIN 203 (H) 05/13/2020 0852   IRONPCTSAT 22 05/13/2020 0852   Lipid Panel     Component Value Date/Time   CHOL 184 06/09/2021 0740   TRIG 114 06/09/2021 0740   HDL 64 06/09/2021 0740   CHOLHDL 2.9 05/13/2020 0852   CHOLHDL 4.4 CALC 07/22/2007 1008   VLDL 17 07/22/2007 1008   LDLCALC 100 (H) 06/09/2021 0740   LDLDIRECT 143.6 07/22/2007 1008   Hepatic Function Panel     Component Value Date/Time   PROT 7.2 06/09/2021 0740   ALBUMIN 4.6 06/09/2021 0740   AST 18 06/09/2021 0740   ALT 24 06/09/2021 0740   ALKPHOS 65 06/09/2021 0740   BILITOT 0.5 06/09/2021 0740   BILIDIR 0.2 07/22/2007 1008      Component Value Date/Time   TSH 1.100 06/09/2021 0740   Nutritional Lab Results  Component Value Date   VD25OH 67.3 06/09/2021   VD25OH 69.9 03/09/2021   VD25OH 77.9 10/13/2020    Medications: Outpatient Encounter Medications as of 02/26/2024  Medication Sig   Calcium Carbonate-Vitamin D  (CALCIUM-VITAMIN D3) 600-125 MG-UNIT TABS Take by mouth.   ibuprofen (ADVIL) 200 MG tablet Take 200 mg by mouth every 6 (six) hours as needed.   loratadine (CLARITIN) 10 MG tablet Take 10 mg by mouth daily.   Multiple Vitamin (MULTIVITAMIN) tablet Take 1 tablet by mouth daily.   phentermine  (ADIPEX-P ) 37.5 MG tablet Take 1 tablet (37.5 mg total) by mouth daily before breakfast.   pseudoephedrine (SUDAFED) 30 MG tablet Take 30 mg by mouth every 4 (four) hours as needed.   venlafaxine  XR (EFFEXOR -XR) 37.5 MG 24 hr capsule Take 1 capsule (37.5 mg total) by mouth daily.   No  facility-administered encounter medications on file as of 02/26/2024.     Follow-Up   Return in about 4 weeks (around 03/25/2024) for For Weight Mangement with Dr. Francyne.SABRA She was informed of the importance of frequent follow up visits to maximize her success with intensive lifestyle modifications for her multiple health conditions.  Attestation Statement   Reviewed by clinician on day of visit: allergies, medications, problem list, medical history, surgical history, family history, social history, and previous encounter notes.     Lucas Francyne, MD

## 2024-02-26 NOTE — Assessment & Plan Note (Signed)
 On phentermine  with adequate clinical response.  No side effects reported continue with current nutritional strategies and pharmacotherapy.

## 2024-02-26 NOTE — Assessment & Plan Note (Signed)
 Most A1c was 5.6.  Improved she has lost 21% of TBWL.  Continue current weight management strategy.  Consider pharmacoprophylaxis with metformin if weight loss plateaus

## 2024-03-25 ENCOUNTER — Encounter (INDEPENDENT_AMBULATORY_CARE_PROVIDER_SITE_OTHER): Payer: Self-pay | Admitting: Internal Medicine

## 2024-03-25 ENCOUNTER — Ambulatory Visit (INDEPENDENT_AMBULATORY_CARE_PROVIDER_SITE_OTHER): Payer: Self-pay | Admitting: Internal Medicine

## 2024-03-25 VITALS — BP 125/77 | HR 85 | Temp 97.7°F | Ht 62.0 in | Wt 154.0 lb

## 2024-03-25 DIAGNOSIS — E669 Obesity, unspecified: Secondary | ICD-10-CM | POA: Diagnosis not present

## 2024-03-25 DIAGNOSIS — Z6828 Body mass index (BMI) 28.0-28.9, adult: Secondary | ICD-10-CM

## 2024-03-25 DIAGNOSIS — R638 Other symptoms and signs concerning food and fluid intake: Secondary | ICD-10-CM | POA: Diagnosis not present

## 2024-03-25 DIAGNOSIS — R7303 Prediabetes: Secondary | ICD-10-CM | POA: Diagnosis not present

## 2024-03-25 DIAGNOSIS — J302 Other seasonal allergic rhinitis: Secondary | ICD-10-CM

## 2024-03-25 NOTE — Assessment & Plan Note (Signed)
 She reports a recurring sinus infection, affecting her exercise routine. Currently using Sudafed for symptom relief but advised to avoid decongestants due to potential interactions with phentermine  and venlafaxine , which could raise blood pressure and cause palpitations. - Use nasal rinses for sinus relief. - Consider using Astelin or Astepro, intranasal antihistamines, as needed for sinus symptoms.

## 2024-03-25 NOTE — Assessment & Plan Note (Signed)
 Medical weight management for overweight and obesity She has lost 2 pounds since the last visit, following a 1000 calorie nutrition plan 50% of the time and exercising four days a week. She has lost 43 pounds (21% of her initial weight) since starting her weight loss journey in 2022. Currently on phentermine , she has lost more than 5% of her weight since starting the medication, meeting the criteria for a clinical response. No side effects reported. Discussed potential long-term use of phentermine , with data suggesting it can be used up to two years. Future options include GLP-1 agonists, which are becoming more affordable. Emphasized maintaining a healthy weight to reduce risks of cancer and diabetes progression. - Continue phentermine  as long as there is a clinical response and no side effects. - VS stable.  Reviewed database during this visit - Consider trial of GLP-1 agonists like Victoza if weight loss plateaus. - Maintain a 1000 calorie nutrition plan and current exercise regimen. - Set a target weight loss of 10-15 pounds over six months.

## 2024-03-25 NOTE — Progress Notes (Signed)
 Office: [phone]  /  Fax: [phone]  Weight Summary and Body Composition Analysis (BIA)  Vitals Temp: 97.7 F (36.5 C) BP: 125/77 Pulse Rate: 85 SpO2: 97 %   Anthropometric Measurements Height: 5' 2 (1.575 m) Weight: 154 lb (69.9 kg) BMI (Calculated): 28.16 Weight at Last Visit: 156 lb Weight Lost Since Last Visit: 2 lb Weight Gained Since Last Visit: 0 lb Starting Weight: 197 lb Total Weight Loss (lbs): 43 lb (19.5 kg) Peak Weight: 197 lb   Body Composition  Body Fat %: 37.7 % Fat Mass (lbs): 58.2 lbs Muscle Mass (lbs): 91.4 lbs Total Body Water (lbs): 66.4 lbs Visceral Fat Rating : 9    RMR: 1267  Today's Visit #: 53  Starting Date: 05/12/20   Subjective   Chief Complaint: Obesity  Interval History  Discussed the use of AI scribe software for clinical note transcription with the patient, who gave verbal consent to proceed.  History of Present Illness Carmen Garza is a 58 year old female with prediabetes who presents for medical weight management.  She has been actively engaged in a weight management program, achieving a weight loss of two pounds since her last visit. She adheres to a 1000 calorie nutrition plan about 50% of the time and exercises four days a week, totaling approximately 240 minutes of cardio and strengthening exercises weekly. She faces challenges maintaining her diet during celebrations and holidays but is mindful of portion sizes and caloric intake, opting for smaller portions and healthier alternatives when possible.  She has a history of weight fluctuations, beginning her weight loss journey in 2022 at 197 pounds, reaching a low of 150 pounds, and experiencing some weight regain in 2023 and early 2024. Since January of this year, she has lost 21 pounds, bringing her current weight to 154 pounds.  She is currently taking phentermine  and venlafaxine  without any reported side effects. She is cautious about potential  interactions, especially with decongestants like Sudafed, which she uses for sinus issues.  She recently experienced a sinus infection, which caused her to miss some exercise days. Despite this, she has maintained an average of four exercise sessions per week. No medication side effects reported. Sinus infection noted, requiring use of Sudafed.  She has a history of ovarian cancer and prediabetes, both relevant to her weight management goals. She has a history of prediabetes and is focused on reducing her risk of diabetes progression and other health issues related to weight.     Challenges affecting patient progress: none.    Pharmacotherapy for weight management: She is currently taking Phentermine  (longterm use, off-label, single agent)  with adequate clinical response  and without side effects..   Assessment and Plan   Treatment Plan For Obesity:  Recommended Dietary Goals  Carmen Garza is currently in the action stage of change. As such, her goal is to continue weight management plan. She has agreed to: continue current plan  Behavioral Health and Counseling  We discussed the following behavioral modification strategies today: continue to work on maintaining a reduced calorie state, getting the recommended amount of protein, incorporating whole foods, making healthy choices, staying well hydrated and practicing mindfulness when eating. and increase protein intake, fibrous foods (25 grams per day for women, 30 grams for men) and water to improve satiety and decrease hunger signals. .  Additional education and resources provided today: None  Recommended Physical Activity Goals  Carmen Garza has been advised to work up to 150 minutes of moderate intensity aerobic activity  a week and strengthening exercises 2-3 times per week for cardiovascular health, weight loss maintenance and preservation of muscle mass.  She has agreed to :  Think about enjoyable ways to increase daily physical activity  and overcoming barriers to exercise, Increase physical activity in their day and reduce sedentary time (increase NEAT)., Increase volume of physical activity to a goal of 240 minutes a week, and Combine aerobic and strengthening exercises for efficiency and improved cardiometabolic health.  Medical Interventions and Pharmacotherapy  We discussed various medication options to help Carmen Garza with her weight loss efforts and we both agreed to : Adequate clinical response to anti-obesity medication, continue current regimen and Patient was counseled on the importance of maintaining healthy lifestyle habits, including balanced nutrition, regular physical activity, and behavioral modifications, while taking antiobesity medication.  Patient verbalized understanding that medication is an adjunct to, not a replacement for, lifestyle changes and that the long-term success and weight maintenance depend on continued adherence to these strategies.  Associated Conditions Impacted by Obesity Treatment  Assessment & Plan Abnormal food appetite  Generalized obesity - starting BMI 36 in 2022 BMI 28.0-28.9,adult Medical weight management for overweight and obesity She has lost 2 pounds since the last visit, following a 1000 calorie nutrition plan 50% of the time and exercising four days a week. She has lost 43 pounds (21% of her initial weight) since starting her weight loss journey in 2022. Currently on phentermine , she has lost more than 5% of her weight since starting the medication, meeting the criteria for a clinical response. No side effects reported. Discussed potential long-term use of phentermine , with data suggesting it can be used up to two years. Future options include GLP-1 agonists, which are becoming more affordable. Emphasized maintaining a healthy weight to reduce risks of cancer and diabetes progression. - Continue phentermine  as long as there is a clinical response and no side effects. - VS stable.   Reviewed database during this visit - Consider trial of GLP-1 agonists like Victoza if weight loss plateaus. - Maintain a 1000 calorie nutrition plan and current exercise regimen. - Set a target weight loss of 10-15 pounds over six months. Prediabetes Most A1c was 5.6.  Improved she has lost 21% of TBWL.  Continue current weight management strategy.  Consider pharmacoprophylaxis with metformin if weight loss plateaus Seasonal allergic rhinitis, unspecified trigger She reports a recurring sinus infection, affecting her exercise routine. Currently using Sudafed for symptom relief but advised to avoid decongestants due to potential interactions with phentermine  and venlafaxine , which could raise blood pressure and cause palpitations. - Use nasal rinses for sinus relief. - Consider using Astelin or Astepro, intranasal antihistamines, as needed for sinus symptoms.      Objective   Physical Exam:  Blood pressure 125/77, pulse 85, temperature 97.7 F (36.5 C), height 5' 2 (1.575 m), weight 154 lb (69.9 kg), SpO2 97%. Body mass index is 28.17 kg/m.  General: She is overweight, cooperative, alert, well developed, and in no acute distress. PSYCH: Has normal mood, affect and thought process.   HEENT: EOMI, sclerae are anicteric. Lungs: Normal breathing effort, no conversational dyspnea. Extremities: No edema.  Neurologic: No gross sensory or motor deficits. No tremors or fasciculations noted.    Diagnostic Data Reviewed:  BMET    Component Value Date/Time   NA 141 06/09/2021 0740   K 4.8 06/09/2021 0740   CL 102 06/09/2021 0740   CO2 26 06/09/2021 0740   GLUCOSE 96 06/09/2021 0740   GLUCOSE 97  07/05/2011 1034   BUN 18 06/09/2021 0740   CREATININE 0.51 (L) 06/09/2021 0740   CALCIUM 9.5 06/09/2021 0740   GFRNONAA 107 05/13/2020 0852   GFRAA 123 05/13/2020 0852   Lab Results  Component Value Date   HGBA1C 5.6 08/20/2023   HGBA1C 5.8 (H) 05/13/2020   Lab Results  Component  Value Date   INSULIN  6.8 08/20/2023   INSULIN  17.7 05/13/2020   Lab Results  Component Value Date   TSH 1.100 06/09/2021   CBC    Component Value Date/Time   WBC 4.8 06/09/2021 0740   WBC 7.9 03/25/2012 1112   WBC 14.2 (H) 06/24/2009 0512   RBC 4.71 06/09/2021 0740   RBC 4.65 03/25/2012 1112   RBC 4.36 06/24/2009 0512   HGB 14.2 06/09/2021 0740   HGB 14.0 03/25/2012 1112   HCT 42.6 06/09/2021 0740   HCT 41.7 03/25/2012 1112   PLT 237 06/09/2021 0740   MCV 90 06/09/2021 0740   MCV 89.7 03/25/2012 1112   MCH 30.1 06/09/2021 0740   MCH 30.2 03/25/2012 1112   MCHC 33.3 06/09/2021 0740   MCHC 33.6 03/25/2012 1112   MCHC 33.4 06/24/2009 0512   RDW 12.0 06/09/2021 0740   RDW 12.8 03/25/2012 1112   Iron Studies    Component Value Date/Time   IRON 72 05/13/2020 0852   TIBC 321 05/13/2020 0852   FERRITIN 203 (H) 05/13/2020 0852   IRONPCTSAT 22 05/13/2020 0852   Lipid Panel     Component Value Date/Time   CHOL 184 06/09/2021 0740   TRIG 114 06/09/2021 0740   HDL 64 06/09/2021 0740   CHOLHDL 2.9 05/13/2020 0852   CHOLHDL 4.4 CALC 07/22/2007 1008   VLDL 17 07/22/2007 1008   LDLCALC 100 (H) 06/09/2021 0740   LDLDIRECT 143.6 07/22/2007 1008   Hepatic Function Panel     Component Value Date/Time   PROT 7.2 06/09/2021 0740   ALBUMIN 4.6 06/09/2021 0740   AST 18 06/09/2021 0740   ALT 24 06/09/2021 0740   ALKPHOS 65 06/09/2021 0740   BILITOT 0.5 06/09/2021 0740   BILIDIR 0.2 07/22/2007 1008      Component Value Date/Time   TSH 1.100 06/09/2021 0740   Nutritional Lab Results  Component Value Date   VD25OH 67.3 06/09/2021   VD25OH 69.9 03/09/2021   VD25OH 77.9 10/13/2020    Medications: Outpatient Encounter Medications as of 03/25/2024  Medication Sig   Calcium Carbonate-Vitamin D  (CALCIUM-VITAMIN D3) 600-125 MG-UNIT TABS Take by mouth.   ibuprofen (ADVIL) 200 MG tablet Take 200 mg by mouth every 6 (six) hours as needed.   loratadine (CLARITIN) 10 MG tablet  Take 10 mg by mouth daily.   Multiple Vitamin (MULTIVITAMIN) tablet Take 1 tablet by mouth daily.   phentermine  (ADIPEX-P ) 37.5 MG tablet Take 1 tablet (37.5 mg total) by mouth daily before breakfast.   pseudoephedrine (SUDAFED) 30 MG tablet Take 30 mg by mouth every 4 (four) hours as needed.   venlafaxine  XR (EFFEXOR -XR) 37.5 MG 24 hr capsule Take 1 capsule (37.5 mg total) by mouth daily.   No facility-administered encounter medications on file as of 03/25/2024.     Follow-Up   Return in about 4 weeks (around 04/22/2024) for For Weight Mangement with Dr. [email or web address].[email or web address] She was informed of the importance of frequent follow up visits to maximize her success with intensive lifestyle modifications for her multiple health conditions.  Attestation Statement   Reviewed by clinician on day of visit: allergies, medications, problem list, medical  history, surgical history, family history, social history, and previous encounter notes.     Lucas Parker, MD

## 2024-03-25 NOTE — Assessment & Plan Note (Signed)
 Most A1c was 5.6.  Improved she has lost 21% of TBWL.  Continue current weight management strategy.  Consider pharmacoprophylaxis with metformin if weight loss plateaus

## 2024-05-01 ENCOUNTER — Encounter (INDEPENDENT_AMBULATORY_CARE_PROVIDER_SITE_OTHER): Payer: Self-pay | Admitting: Internal Medicine

## 2024-05-01 ENCOUNTER — Ambulatory Visit (INDEPENDENT_AMBULATORY_CARE_PROVIDER_SITE_OTHER): Admitting: Internal Medicine

## 2024-05-01 VITALS — BP 119/81 | HR 73 | Temp 98.0°F | Ht 62.0 in | Wt 161.0 lb

## 2024-05-01 DIAGNOSIS — R7303 Prediabetes: Secondary | ICD-10-CM

## 2024-05-01 DIAGNOSIS — E669 Obesity, unspecified: Secondary | ICD-10-CM | POA: Diagnosis not present

## 2024-05-01 DIAGNOSIS — Z6829 Body mass index (BMI) 29.0-29.9, adult: Secondary | ICD-10-CM | POA: Diagnosis not present

## 2024-05-01 DIAGNOSIS — E88819 Insulin resistance, unspecified: Secondary | ICD-10-CM

## 2024-05-01 MED ORDER — PHENTERMINE HCL 37.5 MG PO TABS: 37.5000 mg | ORAL_TABLET | Freq: Every day | ORAL | 1 refills | Status: AC

## 2024-05-01 NOTE — Progress Notes (Signed)
 "  Office: [phone]  /  Fax: [phone]  Weight Summary and Body Composition Analysis (BIA)  Vitals Temp: 98 F (36.7 C) BP: 119/81 Pulse Rate: 73 SpO2: 100 %   Anthropometric Measurements Height: 5' 2 (1.575 m) Weight: 161 lb (73 kg) BMI (Calculated): 29.44 Weight at Last Visit: 154 lb Weight Lost Since Last Visit: 0 lb Weight Gained Since Last Visit: 7 lb Starting Weight: 197 lb Total Weight Loss (lbs): 36 lb (16.3 kg) Peak Weight: 197 lb   Body Composition  Body Fat %: 39.4 % Fat Mass (lbs): 63.4 lbs Muscle Mass (lbs): 92 lbs Total Body Water (lbs): 67.2 lbs Visceral Fat Rating : 9    RMR: 1267  Today's Visit #: 54  Starting Date: 05/12/20   Subjective   Chief Complaint: Obesity  Interval History  Discussed the use of AI scribe software for clinical note transcription with the patient, who gave verbal consent to proceed.  History of Present Illness Carmen Garza is a 59 year old female who presents for medical weight management.  Carmen Garza has experienced a recent weight gain of seven pounds since Carmen Garza last office visit. Carmen Garza follows a 1000-calorie low-carb meal plan approximately 25% of the time and exercises five days a week for 60 minutes, focusing on cardio and strengthening exercises. Carmen Garza stopped taking phentermine  for about a month due to not having a refill, which Carmen Garza believes may have contributed to Carmen Garza weight gain.  Carmen Garza has a history of insulin  resistance and prediabetes, which are associated with Carmen Garza weight management efforts.  Carmen Garza is a cancer survivor with a history of ovarian cancer, which motivates Carmen Garza to maintain a healthy weight. Carmen Garza is focused on staying healthy over time.  Carmen Garza is currently not experiencing any side effects from phentermine . Carmen Garza is cautious about other medication options due to potential side effects experienced by others.     Challenges affecting patient progress: recent lapse in weight management plan due to work,  travel, illness or family circumstances.    Pharmacotherapy for weight management: Carmen Garza is currently taking Phentermine  (longterm use, off-label, single agent)  with adequate clinical response  and without side effects..   Assessment and Plan   Treatment Plan For Obesity:  Recommended Dietary Goals  Carmen Garza is currently in the action stage of change. As such, Carmen Garza goal is to continue weight management plan. Carmen Garza has agreed to: continue current reduced-calorie meal plan  Behavioral Health and Counseling  We discussed the following behavioral modification strategies today: continue to work on maintaining a reduced calorie state, getting the recommended amount of protein, incorporating whole foods, making healthy choices, staying well hydrated and practicing mindfulness when eating. and increase protein intake, fibrous foods (25 grams per day for women, 30 grams for men) and water to improve satiety and decrease hunger signals. .  Additional education and resources provided today: None  Recommended Physical Activity Goals  Carmen Garza has been advised to work up to 150 minutes of moderate intensity aerobic activity a week and strengthening exercises 2-3 times per week for cardiovascular health, weight loss maintenance and preservation of muscle mass.  Carmen Garza has agreed to :  Discussed changing exercise routine to avoid adaptation plateau or training plateau, Increase volume of physical activity to a goal of 240 minutes a week, and Combine aerobic and strengthening exercises for efficiency and improved cardiometabolic health.  Medical Interventions and Pharmacotherapy  We discussed various medication options to help Carmen Garza with Carmen Garza weight loss efforts and we both agreed to :  Patient was counseled on the importance of maintaining healthy lifestyle habits, including balanced nutrition, regular physical activity, and behavioral modifications, while taking antiobesity medication.  Patient verbalized  understanding that medication is an adjunct to, not a replacement for, lifestyle changes and that the long-term success and weight maintenance depend on continued adherence to these strategies. and  We have discussed the long-term use of phentermine  for weight management. Patient informed that the use beyond 12 weeks is off-label. I have reviewed evidence supporting extended use in select patients with regular monitoring. Risks discussed: insomnia, increased heart rate, elevated BP, and potential for dependence. Alternatives include lifestyle interventions, FDA-approved medications (e.g., Wegovy, Zepbound, Qsymia , Contrave) - these are either not covered, not tolerated, cost prohibitive or contraindicated.  Medical history reviewed for contraindications to sympathomimetics.  Patient verbalized understanding and has agreed to continue phentermine  with close monitoring. Plan includes regular follow-up every 4-6 weeks to assess efficacy, side effects, and vitals.  Medication safety:  Discussed safety data and off-label use for long-term treatment of obesity. Reviewed common side effects of phentermine , no side effects reported.  Reviewed vitals signs; blood pressure and heart rate are well controlled. As required by state and federal regulations, the State Controlled Substance Registry (PDMP) was reviewed prior to prescribing. No concerning findings noted. Patient aware that medication will be discontinued if at an adequate dose, there is less than 5% weight loss over a period of 6 months.   Associated Conditions Impacted by Obesity Treatment  Assessment & Plan Prediabetes  Insulin  resistance  Generalized obesity - starting BMI 36 in 2022     Assessment and Plan Assessment & Plan Obesity Recent weight gain of seven pounds since last visit. Previously on a 1000 calorie low carb meal plan and exercising five days a week. Stopped phentermine  for one month, which may have contributed to weight  gain. Discussed the importance of maintaining a healthy weight, especially given Carmen Garza history of ovarian cancer. Considered Wegovy but decided to restart phentermine  due to concerns about side effects and cost. Discussed the potential for insurance coverage of Wegovy and the importance of budgeting for medication costs. Emphasized the need for consistent lifestyle changes to manage weight long-term. - Restarted phentermine  with one refill for 60 days. - Continue 1000 calorie low carb meal plan. - Continue exercising five days a week, 60 minutes of cardio and strengthening. - Monitor weight weekly with a goal of losing 2-3 pounds in four weeks. - Consider [phone] if phentermine  is not effective or tolerated.  Prediabetes Insulin  resistance. Discussed the impact of weight changes on insulin  and glucose levels. Emphasized the importance of monitoring insulin  levels and A1c to assess insulin  resistance. - Requested primary care provider to check insulin  levels and A1c during upcoming physical. - Continue monitoring dietary intake and exercise to manage insulin  resistance.        Objective   Physical Exam:  Blood pressure 119/81, pulse 73, temperature 98 F (36.7 C), height 5' 2 (1.575 m), weight 161 lb (73 kg), SpO2 100%. Body mass index is 29.45 kg/m.  General: Carmen Garza is overweight, cooperative, alert, well developed, and in no acute distress. PSYCH: Has normal mood, affect and thought process.   HEENT: EOMI, sclerae are anicteric. Lungs: Normal breathing effort, no conversational dyspnea. Extremities: No edema.  Neurologic: No gross sensory or motor deficits. No tremors or fasciculations noted.    Diagnostic Data Reviewed:  BMET    Component Value Date/Time   NA 141 06/09/2021 0740   K 4.8  06/09/2021 0740   CL 102 06/09/2021 0740   CO2 26 06/09/2021 0740   GLUCOSE 96 06/09/2021 0740   GLUCOSE 97 07/05/2011 1034   BUN 18 06/09/2021 0740   CREATININE 0.51 (L) 06/09/2021 0740    CALCIUM 9.5 06/09/2021 0740   GFRNONAA 107 05/13/2020 0852   GFRAA 123 05/13/2020 0852   Lab Results  Component Value Date   HGBA1C 5.6 08/20/2023   HGBA1C 5.8 (H) 05/13/2020   Lab Results  Component Value Date   INSULIN  6.8 08/20/2023   INSULIN  17.7 05/13/2020   Lab Results  Component Value Date   TSH 1.100 06/09/2021   CBC    Component Value Date/Time   WBC 4.8 06/09/2021 0740   WBC 7.9 03/25/2012 1112   WBC 14.2 (H) 06/24/2009 0512   RBC 4.71 06/09/2021 0740   RBC 4.65 03/25/2012 1112   RBC 4.36 06/24/2009 0512   HGB 14.2 06/09/2021 0740   HGB 14.0 03/25/2012 1112   HCT 42.6 06/09/2021 0740   HCT 41.7 03/25/2012 1112   PLT 237 06/09/2021 0740   MCV 90 06/09/2021 0740   MCV 89.7 03/25/2012 1112   MCH 30.1 06/09/2021 0740   MCH 30.2 03/25/2012 1112   MCHC 33.3 06/09/2021 0740   MCHC 33.6 03/25/2012 1112   MCHC 33.4 06/24/2009 0512   RDW 12.0 06/09/2021 0740   RDW 12.8 03/25/2012 1112   Iron Studies    Component Value Date/Time   IRON 72 05/13/2020 0852   TIBC 321 05/13/2020 0852   FERRITIN 203 (H) 05/13/2020 0852   IRONPCTSAT 22 05/13/2020 0852   Lipid Panel     Component Value Date/Time   CHOL 184 06/09/2021 0740   TRIG 114 06/09/2021 0740   HDL 64 06/09/2021 0740   CHOLHDL 2.9 05/13/2020 0852   CHOLHDL 4.4 CALC 07/22/2007 1008   VLDL 17 07/22/2007 1008   LDLCALC 100 (H) 06/09/2021 0740   LDLDIRECT 143.6 07/22/2007 1008   Hepatic Function Panel     Component Value Date/Time   PROT 7.2 06/09/2021 0740   ALBUMIN 4.6 06/09/2021 0740   AST 18 06/09/2021 0740   ALT 24 06/09/2021 0740   ALKPHOS 65 06/09/2021 0740   BILITOT 0.5 06/09/2021 0740   BILIDIR 0.2 07/22/2007 1008      Component Value Date/Time   TSH 1.100 06/09/2021 0740   Nutritional Lab Results  Component Value Date   VD25OH 67.3 06/09/2021   VD25OH 69.9 03/09/2021   VD25OH 77.9 10/13/2020    Medications: Outpatient Encounter Medications as of 05/01/2024  Medication Sig    Calcium Carbonate-Vitamin D  (CALCIUM-VITAMIN D3) 600-125 MG-UNIT TABS Take by mouth.   ibuprofen (ADVIL) 200 MG tablet Take 200 mg by mouth every 6 (six) hours as needed.   loratadine (CLARITIN) 10 MG tablet Take 10 mg by mouth daily.   Multiple Vitamin (MULTIVITAMIN) tablet Take 1 tablet by mouth daily.   phentermine  (ADIPEX-P ) 37.5 MG tablet Take 1 tablet (37.5 mg total) by mouth daily before breakfast.   pseudoephedrine (SUDAFED) 30 MG tablet Take 30 mg by mouth every 4 (four) hours as needed.   venlafaxine  XR (EFFEXOR -XR) 37.5 MG 24 hr capsule Take 1 capsule (37.5 mg total) by mouth daily.   No facility-administered encounter medications on file as of 05/01/2024.     Follow-Up   No follow-ups on file.SABRA Carmen Garza was informed of the importance of frequent follow up visits to maximize Carmen Garza success with intensive lifestyle modifications for Carmen Garza multiple health conditions.  Attestation Statement  Reviewed by clinician on day of visit: allergies, medications, problem list, medical history, surgical history, family history, social history, and previous encounter notes.     Lucas Parker, MD  "

## 2024-05-28 ENCOUNTER — Ambulatory Visit (INDEPENDENT_AMBULATORY_CARE_PROVIDER_SITE_OTHER): Admitting: Internal Medicine

## 2024-07-01 ENCOUNTER — Ambulatory Visit (INDEPENDENT_AMBULATORY_CARE_PROVIDER_SITE_OTHER): Admitting: Internal Medicine

## 2024-07-29 ENCOUNTER — Ambulatory Visit (INDEPENDENT_AMBULATORY_CARE_PROVIDER_SITE_OTHER): Admitting: Internal Medicine
